# Patient Record
Sex: Female | Born: 1975 | Race: White | Hispanic: No | Smoking: Never smoker
Health system: Southern US, Community
[De-identification: ages and names within clinical notes are randomized; demographics above are authoritative.]

## PROBLEM LIST (undated history)

## (undated) DIAGNOSIS — F419 Anxiety disorder, unspecified: Secondary | ICD-10-CM

## (undated) DIAGNOSIS — C4491 Basal cell carcinoma of skin, unspecified: Secondary | ICD-10-CM

## (undated) DIAGNOSIS — I493 Ventricular premature depolarization: Secondary | ICD-10-CM

## (undated) DIAGNOSIS — N6019 Diffuse cystic mastopathy of unspecified breast: Secondary | ICD-10-CM

## (undated) DIAGNOSIS — K219 Gastro-esophageal reflux disease without esophagitis: Secondary | ICD-10-CM

## (undated) HISTORY — DX: Gastro-esophageal reflux disease without esophagitis: K21.9

## (undated) HISTORY — DX: Anxiety disorder, unspecified: F41.9

## (undated) HISTORY — DX: Basal cell carcinoma of skin, unspecified: C44.91

## (undated) HISTORY — DX: Ventricular premature depolarization: I49.3

## (undated) HISTORY — DX: Diffuse cystic mastopathy of unspecified breast: N60.19

---

## 1997-11-01 ENCOUNTER — Inpatient Hospital Stay (HOSPITAL_COMMUNITY): Admission: AD | Admit: 1997-11-01 | Discharge: 1997-11-04 | Payer: Self-pay | Admitting: Obstetrics and Gynecology

## 1999-05-31 ENCOUNTER — Other Ambulatory Visit: Admission: RE | Admit: 1999-05-31 | Discharge: 1999-05-31 | Payer: Self-pay | Admitting: Obstetrics and Gynecology

## 2000-09-12 ENCOUNTER — Other Ambulatory Visit: Admission: RE | Admit: 2000-09-12 | Discharge: 2000-09-12 | Payer: Self-pay | Admitting: Obstetrics and Gynecology

## 2000-11-11 ENCOUNTER — Emergency Department (HOSPITAL_COMMUNITY): Admission: EM | Admit: 2000-11-11 | Discharge: 2000-11-11 | Payer: Self-pay | Admitting: *Deleted

## 2001-12-17 ENCOUNTER — Other Ambulatory Visit: Admission: RE | Admit: 2001-12-17 | Discharge: 2001-12-17 | Payer: Self-pay | Admitting: Obstetrics and Gynecology

## 2002-12-31 ENCOUNTER — Other Ambulatory Visit: Admission: RE | Admit: 2002-12-31 | Discharge: 2002-12-31 | Payer: Self-pay | Admitting: Obstetrics and Gynecology

## 2003-02-16 ENCOUNTER — Encounter: Payer: Self-pay | Admitting: Emergency Medicine

## 2003-02-16 ENCOUNTER — Emergency Department (HOSPITAL_COMMUNITY): Admission: EM | Admit: 2003-02-16 | Discharge: 2003-02-16 | Payer: Self-pay | Admitting: Emergency Medicine

## 2004-01-29 ENCOUNTER — Other Ambulatory Visit: Admission: RE | Admit: 2004-01-29 | Discharge: 2004-01-29 | Payer: Self-pay | Admitting: Obstetrics and Gynecology

## 2004-08-10 ENCOUNTER — Other Ambulatory Visit: Admission: RE | Admit: 2004-08-10 | Discharge: 2004-08-10 | Payer: Self-pay | Admitting: Obstetrics and Gynecology

## 2004-12-07 ENCOUNTER — Observation Stay (HOSPITAL_COMMUNITY): Admission: RE | Admit: 2004-12-07 | Discharge: 2004-12-08 | Payer: Self-pay | Admitting: Obstetrics and Gynecology

## 2004-12-07 ENCOUNTER — Encounter (INDEPENDENT_AMBULATORY_CARE_PROVIDER_SITE_OTHER): Payer: Self-pay | Admitting: *Deleted

## 2005-08-29 HISTORY — PX: LAPAROSCOPIC HYSTERECTOMY: SHX1926

## 2006-10-24 ENCOUNTER — Ambulatory Visit: Payer: Self-pay | Admitting: Internal Medicine

## 2006-10-26 ENCOUNTER — Encounter: Admission: RE | Admit: 2006-10-26 | Discharge: 2006-10-26 | Payer: Self-pay | Admitting: Internal Medicine

## 2007-11-29 ENCOUNTER — Ambulatory Visit: Payer: Self-pay | Admitting: Internal Medicine

## 2007-11-29 DIAGNOSIS — F411 Generalized anxiety disorder: Secondary | ICD-10-CM | POA: Insufficient documentation

## 2007-11-29 LAB — CONVERTED CEMR LAB
Basophils Absolute: 0.1 10*3/uL (ref 0.0–0.1)
Basophils Relative: 0.8 % (ref 0.0–1.0)
Eosinophils Absolute: 0.3 10*3/uL (ref 0.0–0.7)
Eosinophils Relative: 3.7 % (ref 0.0–5.0)
Free T4: 1 ng/dL (ref 0.6–1.6)
HCT: 41.5 % (ref 36.0–46.0)
Hemoglobin: 13.7 g/dL (ref 12.0–15.0)
Hgb A1c MFr Bld: 5.1 % (ref 4.6–6.0)
Lymphocytes Relative: 21.3 % (ref 12.0–46.0)
MCHC: 33 g/dL (ref 30.0–36.0)
MCV: 90 fL (ref 78.0–100.0)
Monocytes Absolute: 0.3 10*3/uL (ref 0.1–1.0)
Monocytes Relative: 4.3 % (ref 3.0–12.0)
Neutro Abs: 5.6 10*3/uL (ref 1.4–7.7)
Neutrophils Relative %: 69.9 % (ref 43.0–77.0)
Platelets: 334 10*3/uL (ref 150–400)
RBC: 4.61 M/uL (ref 3.87–5.11)
RDW: 12.4 % (ref 11.5–14.6)
T3 Uptake Ratio: 39.7 % — ABNORMAL HIGH (ref 22.5–37.0)
TSH: 2.31 microintl units/mL (ref 0.35–5.50)
WBC: 8 10*3/uL (ref 4.5–10.5)

## 2007-12-03 ENCOUNTER — Telehealth: Payer: Self-pay | Admitting: *Deleted

## 2007-12-05 ENCOUNTER — Ambulatory Visit: Payer: Self-pay | Admitting: Internal Medicine

## 2007-12-05 LAB — CONVERTED CEMR LAB
ALT: 17 units/L (ref 0–35)
AST: 22 units/L (ref 0–37)
Albumin: 4.1 g/dL (ref 3.5–5.2)
Alkaline Phosphatase: 42 units/L (ref 39–117)
BUN: 10 mg/dL (ref 6–23)
Basophils Absolute: 0 10*3/uL (ref 0.0–0.1)
Basophils Relative: 0.2 % (ref 0.0–1.0)
Bilirubin Urine: NEGATIVE
Bilirubin, Direct: 0.1 mg/dL (ref 0.0–0.3)
Blood in Urine, dipstick: NEGATIVE
CO2: 30 meq/L (ref 19–32)
Calcium: 9.7 mg/dL (ref 8.4–10.5)
Chloride: 108 meq/L (ref 96–112)
Cholesterol: 180 mg/dL (ref 0–200)
Creatinine, Ser: 0.8 mg/dL (ref 0.4–1.2)
Eosinophils Absolute: 0.2 10*3/uL (ref 0.0–0.7)
Eosinophils Relative: 3.2 % (ref 0.0–5.0)
GFR calc Af Amer: 106 mL/min
GFR calc non Af Amer: 88 mL/min
Glucose, Bld: 87 mg/dL (ref 70–99)
Glucose, Urine, Semiquant: NEGATIVE
HCT: 40 % (ref 36.0–46.0)
HDL: 54.5 mg/dL (ref >39.0)
Hemoglobin: 13.5 g/dL (ref 12.0–15.0)
Ketones, urine, test strip: NEGATIVE
LDL Cholesterol: 117 mg/dL — ABNORMAL HIGH (ref 0–99)
Lymphocytes Relative: 29.7 % (ref 12.0–46.0)
MCHC: 33.6 g/dL (ref 30.0–36.0)
MCV: 89.3 fL (ref 78.0–100.0)
Monocytes Absolute: 0.5 10*3/uL (ref 0.1–1.0)
Monocytes Relative: 7.7 % (ref 3.0–12.0)
Neutro Abs: 3.6 10*3/uL (ref 1.4–7.7)
Neutrophils Relative %: 59.2 % (ref 43.0–77.0)
Nitrite: NEGATIVE
Platelets: 316 10*3/uL (ref 150–400)
Potassium: 4.6 meq/L (ref 3.5–5.1)
Protein, U semiquant: NEGATIVE
RBC: 4.48 M/uL (ref 3.87–5.11)
RDW: 12.2 % (ref 11.5–14.6)
Sodium: 142 meq/L (ref 135–145)
Specific Gravity, Urine: 1.01
TSH: 1.34 microintl units/mL (ref 0.35–5.50)
Total Bilirubin: 0.8 mg/dL (ref 0.3–1.2)
Total CHOL/HDL Ratio: 3.3
Total Protein: 7.4 g/dL (ref 6.0–8.3)
Triglycerides: 43 mg/dL (ref 0–149)
Urobilinogen, UA: 0.2
VLDL: 9 mg/dL (ref 0–40)
WBC Urine, dipstick: NEGATIVE
WBC: 6.1 10*3/uL (ref 4.5–10.5)
pH: 6

## 2007-12-12 ENCOUNTER — Ambulatory Visit: Payer: Self-pay | Admitting: Internal Medicine

## 2007-12-12 ENCOUNTER — Other Ambulatory Visit: Admission: RE | Admit: 2007-12-12 | Discharge: 2007-12-12 | Payer: Self-pay | Admitting: Neurosurgery

## 2007-12-12 ENCOUNTER — Encounter: Payer: Self-pay | Admitting: Internal Medicine

## 2007-12-18 ENCOUNTER — Telehealth: Payer: Self-pay | Admitting: Internal Medicine

## 2007-12-21 ENCOUNTER — Encounter: Admission: RE | Admit: 2007-12-21 | Discharge: 2007-12-21 | Payer: Self-pay | Admitting: Internal Medicine

## 2008-04-07 ENCOUNTER — Ambulatory Visit: Payer: Self-pay | Admitting: Internal Medicine

## 2009-01-07 ENCOUNTER — Ambulatory Visit: Payer: Self-pay | Admitting: Internal Medicine

## 2009-01-07 LAB — CONVERTED CEMR LAB
ALT: 14 units/L (ref 0–35)
AST: 18 units/L (ref 0–37)
Albumin: 3.6 g/dL (ref 3.5–5.2)
Alkaline Phosphatase: 43 units/L (ref 39–117)
BUN: 13 mg/dL (ref 6–23)
Basophils Absolute: 0 10*3/uL (ref 0.0–0.1)
Basophils Relative: 0.1 % (ref 0.0–3.0)
Bilirubin Urine: NEGATIVE
Bilirubin, Direct: 0 mg/dL (ref 0.0–0.3)
Blood in Urine, dipstick: NEGATIVE
CO2: 30 meq/L (ref 19–32)
Calcium: 9.1 mg/dL (ref 8.4–10.5)
Chloride: 110 meq/L (ref 96–112)
Cholesterol: 188 mg/dL (ref 0–200)
Creatinine, Ser: 0.9 mg/dL (ref 0.4–1.2)
Eosinophils Absolute: 0.3 10*3/uL (ref 0.0–0.7)
Eosinophils Relative: 5.3 % — ABNORMAL HIGH (ref 0.0–5.0)
GFR calc non Af Amer: 75.73 mL/min (ref >60)
Glucose, Bld: 82 mg/dL (ref 70–99)
Glucose, Urine, Semiquant: NEGATIVE
HCT: 38 % (ref 36.0–46.0)
HDL: 55.6 mg/dL (ref >39.00)
Hemoglobin: 13.3 g/dL (ref 12.0–15.0)
Ketones, urine, test strip: NEGATIVE
LDL Cholesterol: 124 mg/dL — ABNORMAL HIGH (ref 0–99)
Lymphocytes Relative: 27.9 % (ref 12.0–46.0)
Lymphs Abs: 1.5 10*3/uL (ref 0.7–4.0)
MCHC: 35.1 g/dL (ref 30.0–36.0)
MCV: 89.2 fL (ref 78.0–100.0)
Monocytes Absolute: 0.4 10*3/uL (ref 0.1–1.0)
Monocytes Relative: 7.5 % (ref 3.0–12.0)
Neutro Abs: 3.2 10*3/uL (ref 1.4–7.7)
Neutrophils Relative %: 59.2 % (ref 43.0–77.0)
Nitrite: NEGATIVE
Platelets: 286 10*3/uL (ref 150.0–400.0)
Potassium: 4.3 meq/L (ref 3.5–5.1)
RBC: 4.26 M/uL (ref 3.87–5.11)
RDW: 11.7 % (ref 11.5–14.6)
Sodium: 142 meq/L (ref 135–145)
Specific Gravity, Urine: 1.015
TSH: 1.43 microintl units/mL (ref 0.35–5.50)
Total Bilirubin: 0.8 mg/dL (ref 0.3–1.2)
Total CHOL/HDL Ratio: 3
Total Protein: 7 g/dL (ref 6.0–8.3)
Triglycerides: 44 mg/dL (ref 0.0–149.0)
Urobilinogen, UA: 0.2
VLDL: 8.8 mg/dL (ref 0.0–40.0)
WBC Urine, dipstick: NEGATIVE
WBC: 5.4 10*3/uL (ref 4.5–10.5)
pH: 8.5

## 2009-01-15 ENCOUNTER — Other Ambulatory Visit: Admission: RE | Admit: 2009-01-15 | Discharge: 2009-01-15 | Payer: Self-pay | Admitting: Internal Medicine

## 2009-01-15 ENCOUNTER — Ambulatory Visit: Payer: Self-pay | Admitting: Internal Medicine

## 2009-01-15 ENCOUNTER — Encounter: Payer: Self-pay | Admitting: Internal Medicine

## 2009-01-15 LAB — HM PAP SMEAR

## 2009-06-18 ENCOUNTER — Ambulatory Visit: Payer: Self-pay | Admitting: Internal Medicine

## 2009-08-07 ENCOUNTER — Ambulatory Visit: Payer: Self-pay | Admitting: Internal Medicine

## 2010-03-22 ENCOUNTER — Ambulatory Visit: Payer: Self-pay | Admitting: Internal Medicine

## 2010-03-22 LAB — CONVERTED CEMR LAB
ALT: 19 units/L (ref 0–35)
AST: 22 units/L (ref 0–37)
Albumin: 4 g/dL (ref 3.5–5.2)
Alkaline Phosphatase: 35 units/L — ABNORMAL LOW (ref 39–117)
BUN: 16 mg/dL (ref 6–23)
Basophils Absolute: 0.1 10*3/uL (ref 0.0–0.1)
Basophils Relative: 0.9 % (ref 0.0–3.0)
Bilirubin Urine: NEGATIVE
Bilirubin, Direct: 0.2 mg/dL (ref 0.0–0.3)
Blood in Urine, dipstick: NEGATIVE
CO2: 29 meq/L (ref 19–32)
Calcium: 9.2 mg/dL (ref 8.4–10.5)
Chloride: 103 meq/L (ref 96–112)
Cholesterol: 191 mg/dL (ref 0–200)
Creatinine, Ser: 1 mg/dL (ref 0.4–1.2)
Eosinophils Absolute: 0.2 10*3/uL (ref 0.0–0.7)
Eosinophils Relative: 4 % (ref 0.0–5.0)
GFR calc non Af Amer: 70.67 mL/min (ref >60)
Glucose, Bld: 83 mg/dL (ref 70–99)
Glucose, Urine, Semiquant: NEGATIVE
HCT: 37.8 % (ref 36.0–46.0)
HDL: 67.4 mg/dL (ref >39.00)
Hemoglobin: 13.1 g/dL (ref 12.0–15.0)
Ketones, urine, test strip: NEGATIVE
LDL Cholesterol: 111 mg/dL — ABNORMAL HIGH (ref 0–99)
Lymphocytes Relative: 22.8 % (ref 12.0–46.0)
Lymphs Abs: 1.3 10*3/uL (ref 0.7–4.0)
MCHC: 34.8 g/dL (ref 30.0–36.0)
MCV: 91.8 fL (ref 78.0–100.0)
Monocytes Absolute: 0.4 10*3/uL (ref 0.1–1.0)
Monocytes Relative: 7.6 % (ref 3.0–12.0)
Neutro Abs: 3.7 10*3/uL (ref 1.4–7.7)
Neutrophils Relative %: 64.7 % (ref 43.0–77.0)
Nitrite: NEGATIVE
Platelets: 297 10*3/uL (ref 150.0–400.0)
Potassium: 3.8 meq/L (ref 3.5–5.1)
RBC: 4.12 M/uL (ref 3.87–5.11)
RDW: 12.8 % (ref 11.5–14.6)
Sodium: 138 meq/L (ref 135–145)
Specific Gravity, Urine: 1.025
TSH: 1.73 microintl units/mL (ref 0.35–5.50)
Total Bilirubin: 0.7 mg/dL (ref 0.3–1.2)
Total CHOL/HDL Ratio: 3
Total Protein: 7.2 g/dL (ref 6.0–8.3)
Triglycerides: 65 mg/dL (ref 0.0–149.0)
Urobilinogen, UA: 0.2
VLDL: 13 mg/dL (ref 0.0–40.0)
WBC Urine, dipstick: NEGATIVE
WBC: 5.8 10*3/uL (ref 4.5–10.5)
pH: 5

## 2010-04-12 ENCOUNTER — Other Ambulatory Visit: Admission: RE | Admit: 2010-04-12 | Discharge: 2010-04-12 | Payer: Self-pay | Admitting: Internal Medicine

## 2010-04-13 ENCOUNTER — Ambulatory Visit: Payer: Self-pay | Admitting: Internal Medicine

## 2010-04-13 LAB — CONVERTED CEMR LAB: Pap Smear: NORMAL

## 2010-04-15 LAB — CONVERTED CEMR LAB
HCV Ab: NEGATIVE
Hep A IgM: NEGATIVE
Hep B C IgM: NEGATIVE
Hepatitis B Surface Ag: NEGATIVE

## 2010-04-16 ENCOUNTER — Encounter: Admission: RE | Admit: 2010-04-16 | Discharge: 2010-04-16 | Payer: Self-pay | Admitting: Internal Medicine

## 2010-04-16 LAB — CONVERTED CEMR LAB: Pap Smear: NEGATIVE

## 2010-04-16 LAB — HM MAMMOGRAPHY

## 2010-09-30 NOTE — Progress Notes (Signed)
  Phone Note Call from Patient   Caller: Patient Reason for Call: Talk to Doctor Summary of Call: called pt this am and gave her results of pap- pt states she is concerned because she has been on phentermine 37.5 for 5 days and after exercising yesterday her heart was racing and took about 10 mintues before heart calmed down.  Is this normal and does she need to decrease dose?  Initial call taken by: Willy Eddy, LPN,  December 18, 2007 7:59 AM  Follow-up for Phone Call        per dr Kalman Shan phentermine--pt informed Follow-up by: Willy Eddy, LPN,  December 18, 2007 8:37 AM

## 2010-09-30 NOTE — Assessment & Plan Note (Signed)
Summary: 3 month rov/njr   Vital Signs:  Patient Profile:   35 Years Old Female Height:     63 inches Weight:      152 pounds Temp:     98.2 degrees F oral Pulse rate:   76 / minute Resp:     14 per minute BP sitting:   110 / 70  (left arm)  Vitals Entered By: Willy Eddy, LPN (April 07, 2008 11:13 AM)                 Chief Complaint:  roa- stopped phenteramine after 1 week due to heart racing- c/o dysuria.    Current Allergies: No known allergies   Past Medical History:    Reviewed history from 11/29/2007 and no changes required:       Anxiety       Asthma  Past Surgical History:    Reviewed history from 11/29/2007 and no changes required:       Hysterectomy   Family History:    Reviewed history and no changes required:       mother : well       father lung ca DIED  at 75  Social History:    Reviewed history from 11/29/2007 and no changes required:       Married       Never Smoked       Alcohol use-yes       Drug use-yes   Risk Factors:  Passive smoke exposure:  yes  Family History Risk Factors:    Family History of MI in females < 61 years old:  no    Family History of MI in males < 21 years old:  no   Review of Systems  The patient denies anorexia, fever, weight loss, weight gain, vision loss, decreased hearing, hoarseness, chest pain, syncope, dyspnea on exertion, peripheral edema, prolonged cough, headaches, hemoptysis, abdominal pain, melena, hematochezia, severe indigestion/heartburn, hematuria, incontinence, genital sores, muscle weakness, suspicious skin lesions, transient blindness, difficulty walking, depression, unusual weight change, abnormal bleeding, enlarged lymph nodes, angioedema, and breast masses.     Physical Exam  General:     Well-developed,well-nourished,in no acute distress; alert,appropriate and cooperative throughout examination Eyes:     No corneal or conjunctival inflammation noted. EOMI. Perrla. Funduscopic  exam benign, without hemorrhages, exudates or papilledema. Vision grossly normal. Ears:     External ear exam shows no significant lesions or deformities.  Otoscopic examination reveals clear canals, tympanic membranes are intact bilaterally without bulging, retraction, inflammation or discharge. Hearing is grossly normal bilaterally. Nose:     External nasal examination shows no deformity or inflammation. Nasal mucosa are pink and moist without lesions or exudates. Neck:     No deformities, masses, or tenderness noted. Lungs:     Normal respiratory effort, chest expands symmetrically. Lungs are clear to auscultation, no crackles or wheezes. Heart:     Normal rate and regular rhythm. S1 and S2 normal without gallop, murmur, click, rub or other extra sounds. Abdomen:     Bowel sounds positive,abdomen soft and non-tender without masses, organomegaly or hernias noted. Rectal:     No external abnormalities noted. Normal sphincter tone. No rectal masses or tenderness. Genitalia:      surgically abscent cervixnormal introitus, no vaginal discharge, mucosa pink and moist, and no adnexal masses or tenderness.      Impression & Recommendations:  Problem # 1:  EXTRINSIC ASTHMA, UNSPECIFIED (ICD-493.00) rare use of proiventil Her updated medication list  for this problem includes:    Proventil Hfa 108 (90 Base) Mcg/act Aers (Albuterol sulfate) .Marland Kitchen... Tewo puff by mouth as needed   Problem # 2:  CANDIDIASIS, VAGINAL (ICD-112.1)  Her updated medication list for this problem includes:    Fluconazole 150 Mg Tabs (Fluconazole) ..... One by mouth now and repeat in 5 days Discussed treatment regimen and preventive measures.   Problem # 3:  ABNORMAL WEIGHT GAIN (ICD-783.1) weight loss  Complete Medication List: 1)  Proventil Hfa 108 (90 Base) Mcg/act Aers (Albuterol sulfate) .... Tewo puff by mouth as needed 2)  Zyrtec 10 Mg Chew (Cetirizine hcl) .... Once daily as needed 3)  Fluconazole 150 Mg  Tabs (Fluconazole) .... One by mouth now and repeat in 5 days   Patient Instructions: 1)  ROV AND PAP in april 2010   Prescriptions: PROVENTIL HFA 108 (90 BASE) MCG/ACT  AERS (ALBUTEROL SULFATE) tewo puff by mouth as needed  #1 x 11   Entered and Authorized by:   Stacie Glaze MD   Signed by:   Stacie Glaze MD on 04/07/2008   Method used:   Electronically sent to ...       [facility]*       [address] [place], [place]  [address]       Ph: [phone]       Fax: [phone]   RxID:   [id number] FLUCONAZOLE 150 MG  TABS (FLUCONAZOLE) one by mouth now and repeat in 5 days  #2 x 0   Entered and Authorized by:   Stacie Glaze MD   Signed by:   Stacie Glaze MD on 04/07/2008   Method used:   Electronically sent to ...       [facility]*       [address] [place], [place]  [address]       Ph: [phone]       Fax: [phone]   RxID:   [id number]  ]  Appended Document: 3 month rov/njr  Laboratory Results   Urine Tests   Date/Time Reported: April 07, 2008 12:08 PM   Routine Urinalysis   Color: yellow Appearance: Clear Glucose: negative   (Normal Range: Negative) Bilirubin: negative   (Normal Range: Negative) Ketone: negative   (Normal Range: Negative) Spec. Gravity: 1.020   (Normal Range: 1.003-1.035) Blood: negative   (Normal Range: Negative) pH: 7.0   (Normal Range: 5.0-8.0) Protein: negative   (Normal Range: Negative) Urobilinogen: 0.2   (Normal Range: 0-1) Nitrite: negative   (Normal Range: Negative) Leukocyte Esterace: negative   (Normal Range: Negative)    Comments: Rita Ohara  April 07, 2008 12:08 PM

## 2010-09-30 NOTE — Assessment & Plan Note (Signed)
Summary: consult re: ear blockage/sinuses/cjr   Vital Signs:  Patient profile:   35 year old female Height:      63 inches Weight:      152 pounds BMI:     27.02 Temp:     98.2 degrees F oral Pulse rate:   76 / minute Resp:     14 per minute BP sitting:   136 / 80  (left arm)  Vitals Entered By: Willy Eddy, LPN (August 07, 2009 2:52 PM) CC: c/o left earache   CC:  c/o left earache.  History of Present Illness: ear pain with popping sensation in left ear no current sinus symptoms since she has been of the allergra the chronic ifectins by hx have improved she still has seasonal allergies she has mild unilateral head aches due to the sinus pressure  Problems Prior to Update: 1)  Chronic Frontal Sinusitis  (ICD-473.1) 2)  Asthma  (ICD-493.90) 3)  Candidiasis, Vaginal  (ICD-112.1) 4)  Breast Mass, Left  (ICD-611.72) 5)  Extrinsic Asthma, Unspecified  (ICD-493.00) 6)  Routine General Medical Exam@[facility]  (ICD-V70.0) 7)  Abnormal Weight Gain  (ICD-783.1) 8)  Anxiety  (ICD-300.00)  Medications Prior to Update: 1)  Proventil Hfa 108 (90 Base) Mcg/act  Aers (Albuterol Sulfate) .... Tewo Puff By Mouth As Needed 2)  Zyrtec 10 Mg  Chew (Cetirizine Hcl) .... Once Daily As Needed 3)  Fluconazole 150 Mg Tabs (Fluconazole) .... One By Mouth Now and Repeat in 5 Days 4)  Clarithromycin 500 Mg Xr24h-Tab (Clarithromycin) .... One By Mouth Two Times A Day 5)  Fexofenadine-Pseudoephedrine 60-120 Mg Xr12h-Tab (Fexofenadine-Pseudoephedrine) .... One By Mouth Two Times A Day  Current Medications (verified): 1)  Proventil Hfa 108 (90 Base) Mcg/act  Aers (Albuterol Sulfate) .... Tewo Puff By Mouth As Needed 2)  Fexofenadine-Pseudoephedrine 60-120 Mg Xr12h-Tab (Fexofenadine-Pseudoephedrine) .... One By Mouth Two Times A Day 3)  Astelin 137 Mcg/spray Soln (Azelastine Hcl) .... One Spray in Each Nostril, Two Times A Day 4)  Veramyst 27.5 Mcg/spray Susp (Fluticasone Furoate) ....  Two Spray in Each Nostril Two Times A Day  Allergies (verified): No Known Drug Allergies  Past History:  Family History: Last updated: 04/07/2008 mother : well father lung ca DIED  at 2  Social History: Last updated: 11/29/2007 Married Never Smoked Alcohol use-yes Drug use-yes  Risk Factors: Smoking Status: never (11/29/2007) Passive Smoke Exposure: yes (04/07/2008)  Past medical, surgical, family and social histories (including risk factors) reviewed, and no changes noted (except as noted below).  Past Medical History: Reviewed history from 04/07/2008 and no changes required. Anxiety Asthma  Past Surgical History: Reviewed history from 11/29/2007 and no changes required. Hysterectomy  Family History: Reviewed history from 04/07/2008 and no changes required. mother : well father lung ca DIED  at 68  Social History: Reviewed history from 11/29/2007 and no changes required. Married Never Smoked Alcohol use-yes Drug use-yes  Review of Systems  The patient denies anorexia, fever, weight loss, weight gain, vision loss, decreased hearing, hoarseness, chest pain, syncope, dyspnea on exertion, peripheral edema, prolonged cough, headaches, hemoptysis, abdominal pain, melena, hematochezia, severe indigestion/heartburn, hematuria, incontinence, genital sores, muscle weakness, suspicious skin lesions, transient blindness, difficulty walking, depression, unusual weight change, abnormal bleeding, enlarged lymph nodes, angioedema, and breast masses.    Physical Exam  General:  alert and well-developed.   Head:  normocephalic and atraumatic.   Eyes:  pupils equal and pupils round.   Ears:  TM are abnormalR TM retraction,  L TM retraction, and L decreased hearing.   Nose:  mucosal edema and airflow obstruction.   Mouth:  tonsil hypertropied, posterior lymphoid hypertrophy, postnasal drip, and pharyngeal crowding.   Neck:  No deformities, masses, or tenderness noted. Lungs:   normal respiratory effort and no intercostal retractions.   Heart:  normal rate and regular rhythm.   Abdomen:  Bowel sounds positive,abdomen soft and non-tender without masses, organomegaly or hernias noted.   Impression & Recommendations:  Problem # 1:  EUSTACHIAN TUBE DYSFUNCTION (ICD-381.81) veramyst and astelin trial two times a day for the persistant bloackge of the eusttion tube with referral to ent if the symptoms do no abate avoid steriods at this time  Problem # 2:  CHRONIC FRONTAL SINUSITIS (ICD-473.1)  improved with the fexofenadine d The following medications were removed from the medication list:    Clarithromycin 500 Mg Xr24h-tab (Clarithromycin) ..... One by mouth two times a day Her updated medication list for this problem includes:    Fexofenadine-pseudoephedrine 60-120 Mg Xr12h-tab (Fexofenadine-pseudoephedrine) ..... One by mouth two times a day    Astelin 137 Mcg/spray Soln (Azelastine hcl) ..... One spray in each nostril, two times a day    Veramyst 27.5 Mcg/spray Susp (Fluticasone furoate) .Marland Kitchen..Marland Kitchen Two spray in each nostril two times a day  Take antibiotics for full duration. Discussed treatment options including indications for coronal CT scan of sinuses and ENT referral.   Patient Instructions: 1)  Please schedule a follow-up appointment in 2 months.  Appended Document: Orders Update    Clinical Lists Changes  Orders: Added new Service order of Est. Patient Level IV (42595) - Signed

## 2010-09-30 NOTE — Assessment & Plan Note (Signed)
Summary: cpx pap mhf   Vital Signs:  Patient profile:   35 year old female Height:      63 inches Weight:      154 pounds BMI:     27.38 Temp:     98.2 degrees F oral Pulse rate:   76 / minute Resp:     14 per minute BP sitting:   140 / 80  (left arm)  Vitals Entered By: Willy Eddy, LPN (Jan 15, 2009 10:55 AM)  Nutrition Counseling: Patient's BMI is greater than 25 and therefore counseled on weight management options.  CC:  cpx and pap.  History of Present Illness: CPX reviewed health prototocols reveiwed labs has a slight soreness preauricular  Problems Prior to Update: 1)  Asthma  (ICD-493.90) 2)  Candidiasis, Vaginal  (ICD-112.1) 3)  Breast Mass, Left  (ICD-611.72) 4)  Extrinsic Asthma, Unspecified  (ICD-493.00) 5)  Routine General Medical Exam@[facility]  (ICD-V70.0) 6)  Abnormal Weight Gain  (ICD-783.1) 7)  Anxiety  (ICD-300.00)  Medications Prior to Update: 1)  Proventil Hfa 108 (90 Base) Mcg/act  Aers (Albuterol Sulfate) .... Tewo Puff By Mouth As Needed 2)  Zyrtec 10 Mg  Chew (Cetirizine Hcl) .... Once Daily As Needed 3)  Fluconazole 150 Mg  Tabs (Fluconazole) .... One By Mouth Now and Repeat in 5 Days  Current Medications (verified): 1)  Proventil Hfa 108 (90 Base) Mcg/act  Aers (Albuterol Sulfate) .... Tewo Puff By Mouth As Needed 2)  Zyrtec 10 Mg  Chew (Cetirizine Hcl) .... Once Daily As Needed 3)  Fluconazole 150 Mg  Tabs (Fluconazole) .... One By Mouth Now and Repeat in 5 Days  Allergies (verified): No Known Drug Allergies  Past History:  Family History:    mother : well    father lung ca DIED  at 8 (30-Apr-2008)  Social History:    Married    Never Smoked    Alcohol use-yes    Drug use-yes     (11/29/2007)  Risk Factors:    Alcohol Use: N/A    >5 drinks/d w/in last 3 months: N/A    Caffeine Use: N/A    Diet: N/A    Exercise: N/A  Risk Factors:    Smoking Status: never (11/29/2007)    Packs/Day: N/A    Cigars/wk: N/A   Pipe Use/wk: N/A    Cans of tobacco/wk: N/A    Passive Smoke Exposure: yes (April 30, 2008)  Past medical, surgical, family and social histories (including risk factors) reviewed, and no changes noted (except as noted below).  Past Medical History:    Reviewed history from 04-30-08 and no changes required:    Anxiety    Asthma  Past Surgical History:    Reviewed history from 11/29/2007 and no changes required:    Hysterectomy  Family History:    Reviewed history from April 30, 2008 and no changes required:       mother : well       father lung ca DIED  at 15  Social History:    Reviewed history from 11/29/2007 and no changes required:       Married       Never Smoked       Alcohol use-yes       Drug use-yes  Review of Systems  The patient denies anorexia, fever, weight loss, weight gain, vision loss, decreased hearing, hoarseness, chest pain, syncope, dyspnea on exertion, peripheral edema, prolonged cough, headaches, hemoptysis, abdominal pain, melena, hematochezia, severe indigestion/heartburn, hematuria,  incontinence, genital sores, muscle weakness, suspicious skin lesions, transient blindness, difficulty walking, depression, unusual weight change, abnormal bleeding, enlarged lymph nodes, angioedema, and breast masses.    Physical Exam  General:  Well-developed,well-nourished,in no acute distress; alert,appropriate and cooperative throughout examination Head:  Normocephalic and atraumatic without obvious abnormalities. No apparent alopecia or balding. Eyes:  No corneal or conjunctival inflammation noted. EOMI. Perrla. Funduscopic exam benign, without hemorrhages, exudates or papilledema. Vision grossly normal. Ears:  External ear exam shows no significant lesions or deformities.  Otoscopic examination reveals clear canals, tympanic membranes are intact bilaterally without bulging, retraction, inflammation or discharge. Hearing is grossly normal bilaterally. Nose:  External nasal  examination shows no deformity or inflammation. Nasal mucosa are pink and moist without lesions or exudates. Neck:  No deformities, masses, or tenderness noted. Lungs:  Normal respiratory effort, chest expands symmetrically. Lungs are clear to auscultation, no crackles or wheezes. Heart:  Normal rate and regular rhythm. S1 and S2 normal without gallop, murmur, click, rub or other extra sounds.   Impression & Recommendations:  Problem # 1:  ROUTINE GENERAL MEDICAL EXAM@[facility]ALTH  [facility]ARE F[facility]L (ICD-V70.0) reveiwed protocols labs ans set health goals Td Booster: Historical (01/26/2001)   Chol: 188 (01/07/2009)   HDL: 55.60 (01/07/2009)   LDL: 124 (01/07/2009)   TG: 44.0 (01/07/2009) TSH: 1.43 (01/07/2009)   HgbA1C: 5.1 (11/29/2007)    Discussed using sunscreen, use of alcohol, drug use, self breast exam, routine dental care, routine eye care, schedule for GYN exam, routine physical exam, seat belts, multiple vitamins, osteoporosis prevention, adequate calcium intake in diet, recommendations for immunizations, mammograms and Pap smears.  Discussed exercise and checking cholesterol.  Discussed gun safety, safe sex, and contraception.  Complete Medication List: 1)  Proventil Hfa 108 (90 Base) Mcg/act Aers (Albuterol sulfate) .... Tewo puff by mouth as needed 2)  Zyrtec 10 Mg Chew (Cetirizine hcl) .... Once daily as needed 3)  Fluconazole 150 Mg Tabs (Fluconazole) .... One by mouth now and repeat in 5 days  Patient Instructions: 1)  fall for allergy review   OCT  rov   Preventive Care Screening  Pap Smear:    Next Due:  01/2010  Last Tetanus Booster:    Date:  01/26/2001    Results:  Historical

## 2010-09-30 NOTE — Assessment & Plan Note (Signed)
Summary: cpx/pap/jls   Vital Signs:  Patient Profile:   35 Years Old Female Height:     63 inches Weight:      152 pounds Temp:     98.2 degrees F oral Pulse rate:   77 / minute Resp:     14 per minute BP sitting:   130 / 80  (left arm)  Vitals Entered By: Willy Eddy, LPN (December 12, 2007 10:55 AM)                 Chief Complaint:  cpx and pap and breast exam--has had partial hysterectomy with both ovaries remaining-dt in 2002.  History of Present Illness: weight is the same despite running and weight controll efforts diet changes     Prior Medication List:  No prior medications documented  Current Allergies (reviewed today): No known allergies   Past Medical History:    Reviewed history from 11/29/2007 and no changes required:       Anxiety  Past Surgical History:    Reviewed history from 11/29/2007 and no changes required:       Hysterectomy   Family History:    Reviewed history and no changes required:  Social History:    Reviewed history from 11/29/2007 and no changes required:       Married       Never Smoked       Alcohol use-yes       Drug use-yes    Review of Systems  The patient denies anorexia, fever, weight loss, weight gain, vision loss, decreased hearing, hoarseness, chest pain, syncope, dyspnea on exhertion, peripheral edema, prolonged cough, hemoptysis, abdominal pain, melena, hematochezia, severe indigestion/heartburn, hematuria, incontinence, genital sores, muscle weakness, suspicious skin lesions, transient blindness, difficulty walking, depression, unusual weight change, abnormal bleeding, enlarged lymph nodes, angioedema, and breast masses.     Physical Exam  General:     Well-developed,well-nourished,in no acute distress; alert,appropriate and cooperative throughout examination Head:     Normocephalic and atraumatic without obvious abnormalities. No apparent alopecia or balding. Eyes:     No corneal or conjunctival  inflammation noted. EOMI. Perrla. Funduscopic exam benign, without hemorrhages, exudates or papilledema. Vision grossly normal. Nose:     External nasal examination shows no deformity or inflammation. Nasal mucosa are pink and moist without lesions or exudates. Neck:     No deformities, masses, or tenderness noted. Chest Wall:     No deformities, masses, or tenderness noted. Breasts:     No mass, nodules, thickening, tenderness, bulging, retraction, inflamation, nipple discharge or skin changes noted.   Lungs:     Normal respiratory effort, chest expands symmetrically. Lungs are clear to auscultation, no crackles or wheezes. Heart:     Normal rate and regular rhythm. S1 and S2 normal without gallop, murmur, click, rub or other extra sounds. Abdomen:     Bowel sounds positive,abdomen soft and non-tender without masses, organomegaly or hernias noted. Rectal:     No external abnormalities noted. Normal sphincter tone. No rectal masses or tenderness. Genitalia:      surgically abscent cervixnormal introitus, no vaginal discharge, mucosa pink and moist, and no adnexal masses or tenderness.      Impression & Recommendations:  Problem # 1:  EXTRINSIC ASTHMA, UNSPECIFIED (ICD-493.00)  Her updated medication list for this problem includes:    Proventil Hfa 108 (90 Base) Mcg/act Aers (Albuterol sulfate) .Marland Kitchen... As needed\par   Problem # 2:  ROUTINE GENERAL MEDICAL EXAM@[facility] (ICD-V70.0) with pap  and review of labs Orders: EKG w/ Interpretation (93000)   Problem # 3:  ANXIETY (ICD-300.00) Discussed medication use and relaxation techniques.   Problem # 4:  ABNORMAL WEIGHT GAIN (ICD-783.1)  Complete Medication List: 1)  Proventil Hfa 108 (90 Base) Mcg/act Aers (Albuterol sulfate) .... As needed\par 2)  Zyrtec 10 Mg Chew (Cetirizine hcl) .... Once daily as needed 3)  Phentermine Hcl 37.5 Mg Caps (Phentermine hcl) .... One by mouth daily   Patient Instructions: 1)  Please  schedule a follow-up appointment in 3 months. 2)  You need to lose weight. Consider a lower calorie diet and regular exercise.  smalee frequent meaqls ( 5-6 a day) calorie total 1400-1500.    Prescriptions: PHENTERMINE HCL 37.5 MG  CAPS (PHENTERMINE HCL) one by mouth daily  #30 x 2   Entered and Authorized by:   Stacie Glaze MD   Signed by:   Stacie Glaze MD on 12/12/2007   Method used:   Print then Give to Patient   RxID:   [id number]  ]  Appended Document: Orders Update    Clinical Lists Changes  Problems: Added new problem of BREAST MASS, LEFT (ICD-611.72) - diagnostic mamogram Orders: Added new Referral order of Radiology Referral (Radiology) - Signed

## 2010-09-30 NOTE — Assessment & Plan Note (Signed)
Summary: ALWAYS TIRED/CCM   Vital Signs:  Patient Profile:   35 Years Old Female Weight:      150 pounds Temp:     98.2 degrees F oral Pulse rate:   76 / minute BP sitting:   120 / 80  (left arm)  Vitals Entered By: Willy Eddy, LPN (November 28, 1608 4:37 PM)                 Chief Complaint:  c/o gaining weight -10 pounds in 6 months but sh ewatches what she eats and runs 5 miles everyday--also c/o fatigue.  History of Present Illness: Hx of asthma and allergies Family hx of hypothroidism, mother and two sisters only medicine is zyrtec TAH    Past Medical History:    Reviewed history and no changes required:       Anxiety  Past Surgical History:    Reviewed history and no changes required:       Hysterectomy   Social History:    Reviewed history and no changes required:       Married       Never Smoked       Alcohol use-yes       Drug use-yes   Risk Factors:  Tobacco use:  never Drug use:  yes Alcohol use:  yes   Review of Systems       The patient complains of weight gain.  The patient denies anorexia, fever, weight loss, vision loss, decreased hearing, hoarseness, chest pain, syncope, dyspnea on exhertion, peripheral edema, prolonged cough, hemoptysis, abdominal pain, melena, hematochezia, severe indigestion/heartburn, hematuria, incontinence, genital sores, muscle weakness, suspicious skin lesions, transient blindness, difficulty walking, depression, unusual weight change, abnormal bleeding, enlarged lymph nodes, angioedema, and breast masses.     Physical Exam  General:     Well-developed,well-nourished,in no acute distress; alert,appropriate and cooperative throughout examination Head:     Normocephalic and atraumatic without obvious abnormalities. No apparent alopecia or balding. Eyes:     No corneal or conjunctival inflammation noted. EOMI. Perrla. Funduscopic exam benign, without hemorrhages, exudates or papilledema. Vision grossly  normal. Nose:     External nasal examination shows no deformity or inflammation. Nasal mucosa are pink and moist without lesions or exudates. Neck:     No deformities, masses, or tenderness noted. Lungs:     Normal respiratory effort, chest expands symmetrically. Lungs are clear to auscultation, no crackles or wheezes. Heart:     Normal rate and regular rhythm. S1 and S2 normal without gallop, murmur, click, rub or other extra sounds. Abdomen:     Bowel sounds positive,abdomen soft and non-tender without masses, organomegaly or hernias noted.    Impression & Recommendations:  Problem # 1:  ABNORMAL WEIGHT GAIN (ICD-783.1) pt states that no diet works will rule out metabolic Orders: TLB-TSH (Thyroid Stimulating Hormone) (84443-TSH) TLB-CBC Platelet - w/Differential (85025-CBCD) TLB-T3 Uptake (84479-T3UP) TLB-T4 (Thyrox), Free (84439-FT4R) TLB-A1C / Hgb A1C (Glycohemoglobin) (83036-A1C) if all is negaive   give rx for phenteramie   Patient Instructions: 1)  Please schedule a follow-up appointment in 2 weeks.  for PAP    ]

## 2010-09-30 NOTE — Assessment & Plan Note (Signed)
Summary: CPX // RS rsc bmp/njr   Vital Signs:  Patient profile:   35 year old female Height:      63 inches Weight:      146 pounds BMI:     25.96 Temp:     98.2 degrees F oral Pulse rate:   76 / minute Resp:     14 per minute BP sitting:   136 / 80  (left arm)  Vitals Entered By: Willy Eddy, LPN (April 13, 2010 3:21 PM)  Nutrition Counseling: Patient's BMI is greater than 25 and therefore counseled on weight management options. CC: cpx and pap Is Patient Diabetic? No     Last PAP Result normal   CC:  cpx and pap.  History of Present Illness: The pt was asked about all immunizations, health maint. services that are appropriate to their age and was given guidance on diet exercize  and weight management   asthma is stable ear is recovered ( see prior  OV note)  recent divorce due to infidelity requestins screeing labs for std for precaution  Preventive Screening-Counseling & Management  Alcohol-Tobacco     Smoking Status: never     Passive Smoke Exposure: yes     Tobacco Counseling: not indicated; no tobacco use  Problems Prior to Update: 1)  Breast Cyst, Left  (ICD-610.0) 2)  Contact With or Exposure To Venereal Diseases  (ICD-V01.6) 3)  Eustachian Tube Dysfunction  (ICD-381.81) 4)  Chronic Frontal Sinusitis  (ICD-473.1) 5)  Asthma  (ICD-493.90) 6)  Candidiasis, Vaginal  (ICD-112.1) 7)  Breast Mass, Left  (ICD-611.72) 8)  Extrinsic Asthma, Unspecified  (ICD-493.00) 9)  Routine General Medical Exam@health  Care Facl  (ICD-V70.0) 10)  Abnormal Weight Gain  (ICD-783.1) 11)  Anxiety  (ICD-300.00)  Current Problems (verified): 1)  Eustachian Tube Dysfunction  (ICD-381.81) 2)  Chronic Frontal Sinusitis  (ICD-473.1) 3)  Asthma  (ICD-493.90) 4)  Candidiasis, Vaginal  (ICD-112.1) 5)  Breast Mass, Left  (ICD-611.72) 6)  Extrinsic Asthma, Unspecified  (ICD-493.00) 7)  Routine General Medical Exam@health  Care Facl  (ICD-V70.0) 8)  Abnormal Weight Gain   (ICD-783.1) 9)  Anxiety  (ICD-300.00)  Medications Prior to Update: 1)  Proventil Hfa 108 (90 Base) Mcg/act  Aers (Albuterol Sulfate) .... Tewo Puff By Mouth As Needed 2)  Fexofenadine-Pseudoephedrine 60-120 Mg Xr12h-Tab (Fexofenadine-Pseudoephedrine) .... One By Mouth Two Times A Day 3)  Astelin 137 Mcg/spray Soln (Azelastine Hcl) .... One Spray in Each Nostril, Two Times A Day 4)  Veramyst 27.5 Mcg/spray Susp (Fluticasone Furoate) .... Two Spray in Each Nostril Two Times A Day  Current Medications (verified): 1)  Proventil Hfa 108 (90 Base) Mcg/act  Aers (Albuterol Sulfate) .... Tewo Puff By Mouth As Needed 2)  Fexofenadine Hcl 60 Mg Tabs (Fexofenadine Hcl) .Marland Kitchen.. 1 Once Daily As Needed 3)  Astelin 137 Mcg/spray Soln (Azelastine Hcl) .... One Spray in Each Nostril, Two Times A Day 4)  Veramyst 27.5 Mcg/spray Susp (Fluticasone Furoate) .... Two Spray in Each Nostril Two Times A Day  Allergies (verified): No Known Drug Allergies  Past History:  Family History: Last updated: 04/07/2008 mother : well father lung ca DIED  at 60  Social History: Last updated: 11/29/2007 Married Never Smoked Alcohol use-yes Drug use-yes  Risk Factors: Smoking Status: never (04/13/2010) Passive Smoke Exposure: yes (04/13/2010)  Past medical, surgical, family and social histories (including risk factors) reviewed, and no changes noted (except as noted below).  Past Medical History: Reviewed history from 04/07/2008 and no changes  required. Anxiety Asthma  Past Surgical History: Reviewed history from 11/29/2007 and no changes required. Hysterectomy  Family History: Reviewed history from 04/07/2008 and no changes required. mother : well father lung ca DIED  at 82  Social History: Reviewed history from 11/29/2007 and no changes required. Married Never Smoked Alcohol use-yes Drug use-yes  Review of Systems  The patient denies anorexia, fever, weight loss, weight gain, vision loss,  decreased hearing, hoarseness, chest pain, syncope, dyspnea on exertion, peripheral edema, prolonged cough, headaches, hemoptysis, abdominal pain, melena, hematochezia, severe indigestion/heartburn, hematuria, incontinence, genital sores, muscle weakness, suspicious skin lesions, transient blindness, difficulty walking, depression, unusual weight change, abnormal bleeding, enlarged lymph nodes, angioedema, and breast masses.    Physical Exam  General:  alert and well-developed.   Head:  normocephalic and atraumatic.   Eyes:  pupils equal and pupils round.   Ears:  R ear normal and L ear normal.   Nose:  no external deformity and nose piercing noted.   Mouth:  tonsil hypertropied, posterior lymphoid hypertrophy, postnasal drip, and pharyngeal crowding.   Neck:  No deformities, masses, or tenderness noted. Lungs:  normal respiratory effort and no intercostal retractions.   Heart:  normal rate and regular rhythm.   Abdomen:  Bowel sounds positive,abdomen soft and non-tender without masses, organomegaly or hernias noted. Msk:  No deformity or scoliosis noted of thoracic or lumbar spine.   Pulses:  R and L carotid,radial,femoral,dorsalis pedis and posterior tibial pulses are full and equal bilaterally Extremities:  No clubbing, cyanosis, edema, or deformity noted with normal full range of motion of all joints.   Neurologic:  No cranial nerve deficits noted. Station and gait are normal. Plantar reflexes are down-going bilaterally. DTRs are symmetrical throughout. Sensory, motor and coordinative functions appear intact.   Impression & Recommendations:  Problem # 1:  ROUTINE GENERAL MEDICAL EXAM@HEALTH  CARE FACL (ICD-V70.0) The pt was asked about all immunizations, health maint. services that are appropriate to their age and was given guidance on diet exercize  and weight management  Pap smear: normal (04/13/2010) Td Booster: Historical (01/26/2001)   Chol: 191 (03/22/2010)   HDL: 67.40  (03/22/2010)   LDL: 111 (03/22/2010)   TG: 65.0 (03/22/2010) TSH: 1.73 (03/22/2010)   HgbA1C: 5.1 (11/29/2007)    Discussed using sunscreen, use of alcohol, drug use, self breast exam, routine dental care, routine eye care, schedule for GYN exam, routine physical exam, seat belts, multiple vitamins, osteoporosis prevention, adequate calcium intake in diet, recommendations for immunizations, mammograms and Pap smears.  Discussed exercise and checking cholesterol.  Discussed gun safety, safe sex, and contraception.  Problem # 2:  CONTACT WITH OR EXPOSURE TO VENEREAL DISEASES (ICD-V01.6)  Orders: Venipuncture ([address]) T-Hepatitis Acute Panel (60454-09811) T-HIV Antibody  (Reflex) (91478-29562) T-RPR (Syphilis) (13086-57846) Specimen Handling (96295)  Complete Medication List: 1)  Proventil Hfa 108 (90 Base) Mcg/act Aers (Albuterol sulfate) .... Tewo puff by mouth as needed 2)  Fexofenadine Hcl 60 Mg Tabs (Fexofenadine hcl) .Marland Kitchen.. 1 once daily as needed 3)  Astelin 137 Mcg/spray Soln (Azelastine hcl) .... One spray in each nostril, two times a day 4)  Veramyst 27.5 Mcg/spray Susp (Fluticasone furoate) .... Two spray in each nostril two times a day  Other Orders: Radiology Referral (Radiology)  Patient Instructions: 1)  Please schedule a follow-up appointment in 6 months. Prescriptions: PROVENTIL HFA 108 (90 BASE) MCG/ACT  AERS (ALBUTEROL SULFATE) tewo puff by mouth as needed  #18 Gram x 3   Entered by:   Willy Eddy,  LPN   Authorized by:   Stacie Glaze MD   Signed by:   Willy Eddy, LPN on 16/05/9603   Method used:   Electronically to        [facility] [phone]* (retail)       [address] [place], [place]  [address]       Ph: [phone]       Fax: [phone]   RxID:   [id number]    Preventive Care Screening  Pap Smear:    Date:  04/13/2010    Next Due:  01/2011    Results:  normal

## 2010-09-30 NOTE — Assessment & Plan Note (Signed)
Summary: 5 month rov/njr   Vital Signs:  Patient profile:   35 year old female Height:      63 inches Weight:      152 pounds BMI:     27.02 Temp:     98.2 degrees F oral Pulse rate:   72 / minute Resp:     14 per minute BP sitting:   130 / 80  (left arm)  Vitals Entered By: Willy Eddy, LPN (June 18, 2009 10:24 AM)  Contraindications/Deferment of Procedures/Staging:    Test/Procedure: FLU VAX    Reason for deferment: patient declined   CC:  c/o seasonal allergies.  History of Present Illness: Allergy flair as predicted with conjestion, nasal blockage/obstruction post nasal drip nocturnal non productive cough some sneezing currently using zyrtec and flonase without relief, with occasional allerx for eyes symotomiatic today without ever or chills  Asthma History    Initial Asthma Severity Rating:    Age range: 12+ years    Symptoms: 0-2 days/week    Nighttime Awakenings: 0-2/month    Interferes w/ normal activity: no limitations    Asthma Severity Assessment: Intermittent   Problems Prior to Update: 1)  Asthma  (ICD-493.90) 2)  Candidiasis, Vaginal  (ICD-112.1) 3)  Breast Mass, Left  (ICD-611.72) 4)  Extrinsic Asthma, Unspecified  (ICD-493.00) 5)  Routine General Medical Exam@health  Care Facl  (ICD-V70.0) 6)  Abnormal Weight Gain  (ICD-783.1) 7)  Anxiety  (ICD-300.00)  Medications Prior to Update: 1)  Proventil Hfa 108 (90 Base) Mcg/act  Aers (Albuterol Sulfate) .... Tewo Puff By Mouth As Needed 2)  Zyrtec 10 Mg  Chew (Cetirizine Hcl) .... Once Daily As Needed 3)  Fluconazole 150 Mg  Tabs (Fluconazole) .... One By Mouth Now and Repeat in 5 Days  Current Medications (verified): 1)  Proventil Hfa 108 (90 Base) Mcg/act  Aers (Albuterol Sulfate) .... Tewo Puff By Mouth As Needed 2)  Zyrtec 10 Mg  Chew (Cetirizine Hcl) .... Once Daily As Needed  Allergies (verified): No Known Drug Allergies  Past History:  Family History: Last updated:  04/07/2008 mother : well father lung ca DIED  at 4  Social History: Last updated: 11/29/2007 Married Never Smoked Alcohol use-yes Drug use-yes  Risk Factors: Smoking Status: never (11/29/2007) Passive Smoke Exposure: yes (04/07/2008)  Past medical, surgical, family and social histories (including risk factors) reviewed, and no changes noted (except as noted below).  Past Medical History: Reviewed history from 04/07/2008 and no changes required. Anxiety Asthma  Past Surgical History: Reviewed history from 11/29/2007 and no changes required. Hysterectomy  Family History: Reviewed history from 04/07/2008 and no changes required. mother : well father lung ca DIED  at 55  Social History: Reviewed history from 11/29/2007 and no changes required. Married Never Smoked Alcohol use-yes Drug use-yes  Review of Systems       The patient complains of hoarseness and prolonged cough.  The patient denies anorexia, fever, weight loss, weight gain, vision loss, decreased hearing, chest pain, syncope, dyspnea on exertion, peripheral edema, headaches, hemoptysis, abdominal pain, melena, hematochezia, severe indigestion/heartburn, hematuria, incontinence, genital sores, muscle weakness, suspicious skin lesions, transient blindness, difficulty walking, depression, unusual weight change, abnormal bleeding, enlarged lymph nodes, angioedema, and breast masses.    Physical Exam  General:  alert and well-developed.   Head:  normocephalic and atraumatic.   Eyes:  pupils equal and pupils round.   Ears:  R ear normal and L ear normal.   Nose:  mucosal edema and airflow  obstruction.   Mouth:  tonsil hypertropied, posterior lymphoid hypertrophy, postnasal drip, and pharyngeal crowding.   Neck:  No deformities, masses, or tenderness noted. Lungs:  normal respiratory effort and no intercostal retractions.   Heart:  normal rate and regular rhythm.   Abdomen:  Bowel sounds positive,abdomen soft and  non-tender without masses, organomegaly or hernias noted.   Impression & Recommendations:  Problem # 1:  EXTRINSIC ASTHMA, UNSPECIFIED (ICD-493.00) stable Her updated medication list for this problem includes:    Proventil Hfa 108 (90 Base) Mcg/act Aers (Albuterol sulfate) .Marland Kitchen... Tewo puff by mouth as needed  Problem # 2:  CANDIDIASIS, VAGINAL (ICD-112.1)  with every antibiotic she takes! The following medications were removed from the medication list:    Fluconazole 150 Mg Tabs (Fluconazole) ..... One by mouth now and repeat in 5 days Her updated medication list for this problem includes:    Fluconazole 150 Mg Tabs (Fluconazole) ..... One by mouth now and repeat in 5 days  Discussed treatment regimen and preventive measures.   Problem # 3:  CHRONIC FRONTAL SINUSITIS (ICD-473.1)  keep up with the sinus saline lavage Her updated medication list for this problem includes:    Clarithromycin 500 Mg Xr24h-tab (Clarithromycin) ..... One by mouth two times a day    Fexofenadine-pseudoephedrine 60-120 Mg Xr12h-tab (Fexofenadine-pseudoephedrine) ..... One by mouth two times a day  Take antibiotics for full duration. Discussed treatment options including indications for coronal CT scan of sinuses and ENT referral.   Complete Medication List: 1)  Proventil Hfa 108 (90 Base) Mcg/act Aers (Albuterol sulfate) .... Tewo puff by mouth as needed 2)  Zyrtec 10 Mg Chew (Cetirizine hcl) .... Once daily as needed 3)  Fluconazole 150 Mg Tabs (Fluconazole) .... One by mouth now and repeat in 5 days 4)  Clarithromycin 500 Mg Xr24h-tab (Clarithromycin) .... One by mouth two times a day 5)  Fexofenadine-pseudoephedrine 60-120 Mg Xr12h-tab (Fexofenadine-pseudoephedrine) .... One by mouth two times a day  Patient Instructions: 1)  Please schedule a follow-up appointment in 5months. 2)  fall for allergy review Prescriptions: FEXOFENADINE-PSEUDOEPHEDRINE 60-120 MG XR12H-TAB (FEXOFENADINE-PSEUDOEPHEDRINE)  one by mouth two times a day  #60 x 4   Entered and Authorized by:   Stacie Glaze MD   Signed by:   Stacie Glaze MD on 06/18/2009   Method used:   Electronically to        [facility] [phone]* (retail)       [address] [place], [place]  [address]       Ph: [phone]       Fax: [phone]   RxID:   [id number] CLARITHROMYCIN 500 MG XR24H-TAB (CLARITHROMYCIN) one by mouth two times a day  #14 x 0   Entered and Authorized by:   Stacie Glaze MD   Signed by:   Stacie Glaze MD on 06/18/2009   Method used:   Electronically to        [facility] [phone]* (retail)       [address] [place], [place]  [address]       Ph: [phone]       Fax: [phone]   RxID:   [id number] FLUCONAZOLE 150 MG TABS (FLUCONAZOLE) one by mouth now and repeat in 5 days  #2 x 2   Entered and Authorized by:   Stacie Glaze MD   Signed  by:   Stacie Glaze MD on 06/18/2009   Method used:   Electronically to        [facility] [phone]* (retail)       [address] [place], [place]  [address]       Ph: [phone]       Fax: [phone]   RxID:   [id number]

## 2010-09-30 NOTE — Progress Notes (Signed)
Summary: lab results.  Phone Note Call from Patient   Caller: Patient Call For: Dr. Lovell Sheehan Summary of Call: Pt. would like lab results, please. [phone] Initial call taken by: Lynann Beaver CMA,  December 03, 2007 2:07 PM  Follow-up for Phone Call        normal thyroid labs Follow-up by: Stacie Glaze MD,  December 03, 2007 6:05 PM  Additional Follow-up for Phone Call Additional follow up Details #1::        pt informed Additional Follow-up by: Willy Eddy, LPN,  December 04, 2007 7:58 AM

## 2011-01-14 NOTE — Op Note (Signed)
NAMETIPHANIE, VO NO.:  [id number]   MEDICAL RECORD NO.:  [id number]          PATIENT TYPE:  OBV   LOCATION:  NA                            FACILITY:  [facility]   PHYSICIAN:  Juluis Mire, M.D.   DATE OF BIRTH:  February 13, 1976   DATE OF PROCEDURE:  12/07/2004  DATE OF DISCHARGE:                                 OPERATIVE REPORT   PREOPERATIVE DIAGNOSES:  Abnormal cervical cytology with recurrent cervical  dysplasia.   POSTOPERATIVE DIAGNOSES:  Abnormal cervical cytology with recurrent cervical  dysplasia.   OPERATION:  Laparoscopically assisted vaginal hysterectomy.   SURGEON:  Juluis Mire, M.D.   ANESTHESIA:  General endotracheal.   ESTIMATED BLOOD LOSS:  2-300 mL.   PACKS/DRAINS:  None.   BLOOD REPLACED:  None.   COMPLICATIONS:  None.   INDICATIONS FOR PROCEDURE:  Dictated in the history and physical.   DESCRIPTION OF PROCEDURE:  The patient was taken to the OR and placed in the  supine position. After a satisfactory level of general endotracheal  anesthesia was obtained. The patient was placed in the dorsal lithotomy  position using the Allen stirrups. The abdomen, perineum and vagina prepped  out with Betadine. The bladder was emptied by in and out catheterization. A  Hulka tenaculum was put in place. The patient was draped in a sterile field.  A subumbilical incision was made with a knife. The Veress needle was  introduced in the abdominal cavity. The abdomen was inflated to  approximately 3 liters of carbon dioxide. The operating laparoscope was  introduced. There was no evidence of injury to adjacent organs. A 5 mm  trocar was put in place under direct visualization. The appendix was normal.  The upper abdomen including the liver and tip of the gallbladder were clear.  Both lateral gutters were unremarkable. Uterus, tubes and ovaries were  clear. The uterus was enlarged, possible fibroid. Using the gyrus bipolar,  the right adnexa was separated  from the uterus including the uteroovarian  ligament and round ligament. Next, the left adnexa was separated from the  uterus using the bipolar. At this point in time, we had good hemostasis,  separation of the adnexa.   At this point in time, we returned to the vaginal area, legs were  repositioned, a weighted speculum was placed in the vaginal vault, the hulka  tenaculum was then removed. Jacobs tenaculum was used to grasp the cervix,  cul-de-sac was entered sharply. Both uterosacral ligaments were clamped, cut  and suture ligated with #0 Vicryl. The reflection of vaginal mucosa  anteriorly was then incised. The bladder was dissected superiorly.  Retractors put in place to retract the bladder superiorly using the clamp,  cut and tie technique with sutures of #0 Vicryl. The parametrium was  sterilely separated from the sides of the uterus. The uterus was then  flipped, held pedicles were then clamped and cut, uterus passed off the  operative field, held pedicles secured with free tie of #0 Vicryl. The  vaginal mucosa was reapproximated in the midline with interrupted figure-of-  eights of #0 Vicryl.  A uterosacral plication stick was put in place and  secured. At this point in time, we had good hemostasis and approximation. A  Foley was placed to straight drain with retrieval of an adequate amount of  clear urine. A sponge on a sponge stick was placed in the vaginal vault.   The patient's legs were repositioned, abdomen was reinflated with carbon  dioxide, visualization revealed good hemostasis at the cuff and both ovarian  vasculature. We irrigated, no bleeding was encountered. At this point in  time, the abdomen was deflated with carbon dioxide, all trocars removed. The  subumbilical incision was closed with interrupted subcuticular's of 4-0  Vicryl. The suprapubic incision was closed with Dermabond. A sponge on a  sponge stick was removed from the vaginal vault. The patient was taken  out  of the dorsal lithotomy position once alert and extubated and transferred to  the recovery room in good condition. Sponge, needle and instrument count  reported as correct by the circulating nurse x2.      JSM/MEDQ  D:  12/07/2004  T:  12/07/2004  Job:  [id number]

## 2011-01-14 NOTE — H&P (Signed)
NAME:  Alexandra Welch, Alexandra Welch NO.:  [id number]   MEDICAL RECORD NO.:  [id number]          PATIENT TYPE:  OBV   LOCATION:  NA                            FACILITY:  [facility]   PHYSICIAN:  Juluis Mire, M.D.   DATE OF BIRTH:  04-16-1976   DATE OF ADMISSION:  DATE OF DISCHARGE:                                HISTORY & PHYSICAL   The patient is a 35 year old gravida 5, para 2, abortus 3, female who  presents for laparoscopically-assisted vaginal hysterectomy.   In relation to the present admission, the patient had a previous LEEP of the  cervix done in August 2005.  Pathology from the LEEP did reveal low-grade  dysplasia with focal moderate dysplasia, and margins were not involved.  Subsequently she had a Pap smear done in December 2005 that revealed  persistent mild dysplasia as well as high-risk HPV.  She underwent repeat  colposcopy and Pap smear.  It looked like she did have recurrent dysplasia.  Colposcopic-directed biopsy did reveal high-grade dysplasia.  Endocervical  curettings were basically negative.  In view of recurrent dysplasia, options  were discussed.  The patient decided instead of having further treatment of  the cervix to proceed with laparoscopically-assisted vaginal hysterectomy  for management of recurrent cervical dysplasia.  Alternatives have been  discussed, such as repeating the LEEP of the cervix and close follow-up.   In terms of allergies, she has no known drug allergies.   MEDICATIONS:  Xanax as needed.   PAST MEDICAL HISTORY:  The usual childhood diseases without any significant  sequelae.  From a surgical standpoint, she has had three D&E's for  incomplete spontaneous abortions, and she has had two vaginal deliveries.   FAMILY HISTORY:  Noncontributory.   SOCIAL HISTORY:  No tobacco or alcohol use.   REVIEW OF SYSTEMS:  Noncontributory.   PHYSICAL EXAMINATION:  VITAL SIGNS:  The patient is afebrile with stable  vital signs.  HEENT:  The  patient is normocephalic.  Pupils equal, round, and reactive to  light and accommodation.  Extraocular movements were intact.  Sclerae and  conjunctivae were clear.  Oropharynx clear.  NECK:  Without thyromegaly.  BREASTS:  No discrete masses.  CHEST:  Lungs clear.  CARDIAC:  Regular rhythm and rate without murmurs or gallops.  ABDOMEN:  Benign.  No masses, organomegaly or tenderness.  PELVIC:  Normal external genitalia.  Vaginal mucosa is clear.  Cervix  unremarkable.  Uterus felt to be of normal size, shape and contour.  Adnexa  free of masses or tenderness.  EXTREMITIES:  Trace edema.  NEUROLOGIC:  Grossly within normal limits.   IMPRESSION:  Recurrent cervical dysplasia, desirous of hysterectomy.   PLAN:  The patient will undergo a laparoscopically-assisted vaginal  hysterectomy.  Again, alternatives have been discussed, the risks of surgery  explained, including the risk of infection; the risk of hemorrhage that  could require transfusion with the risk of AIDS or hepatitis; the risk of  injury to adjacent organs including bladder, bowel or ureters that could  require further exploratory surgery; the risk of deep venous thrombosis and  pulmonary embolus.  The patient expressed an understanding of the  indications, risks and options.      JSM/MEDQ  D:  12/07/2004  T:  12/07/2004  Job:  [id number]

## 2011-01-14 NOTE — Discharge Summary (Signed)
NAMEKIRSTEIN, BAXLEY NO.:  [id number]   MEDICAL RECORD NO.:  [id number]          PATIENT TYPE:  OBV   LOCATION:  9305                          FACILITY:  [facility]   PHYSICIAN:  Juluis Mire, M.D.   DATE OF BIRTH:  Nov 08, 1975   DATE OF ADMISSION:  12/07/2004  DATE OF DISCHARGE:                                 DISCHARGE SUMMARY   PREOPERATIVE DIAGNOSIS:  Recurrent cervical dysplasia.   DISCHARGE DIAGNOSIS:  Recurrent cervical dysplasia.   OPERATIVE PROCEDURE:  Laparoscopic-assisted vaginal hysterectomy.   For her complete history and physical please see dictated note.   COURSE IN THE HOSPITAL:  The patient underwent a laparoscopic-assisted  vaginal hysterectomy. Pathology is still pending. Postoperatively did well.  Postoperative hemoglobin 9.7. Discharged home on postoperative day #1. At  that time she was tolerating a regular diet. She was ambulating without  difficulty. She had normal bladder function. Abdomen was soft, nontender,  bowel sounds were active. The patient was passing flatus. She had no active  vaginal bleeding.   COMPLICATIONS:  None were encountered during her stay in the hospital. The  patient was discharged home in stable condition.   DISPOSITION:  Routine postoperative instructions were given. She is to avoid  heavy lifting, vaginal entrance, or driving a car. To call us with signs of  infection, nausea, vomiting, increasing abdominal pain, or active vaginal  bleeding. Discharged home on Percocet if she needs it for pain, iron sulfate  supplementation. Reassess in the office in 1 week.      JSM/MEDQ  D:  12/08/2004  T:  12/08/2004  Job:  [id number]

## 2011-04-08 ENCOUNTER — Other Ambulatory Visit: Payer: Self-pay

## 2011-04-11 ENCOUNTER — Other Ambulatory Visit (INDEPENDENT_AMBULATORY_CARE_PROVIDER_SITE_OTHER): Payer: Managed Care, Other (non HMO)

## 2011-04-11 DIAGNOSIS — Z0000 Encounter for general adult medical examination without abnormal findings: Secondary | ICD-10-CM

## 2011-04-11 LAB — CBC WITH DIFFERENTIAL/PLATELET
Basophils Absolute: 0 10*3/uL (ref 0.0–0.1)
Basophils Relative: 0.7 % (ref 0.0–3.0)
Eosinophils Absolute: 0.3 10*3/uL (ref 0.0–0.7)
Eosinophils Relative: 3.7 % (ref 0.0–5.0)
HCT: 40.3 % (ref 36.0–46.0)
Hemoglobin: 13.6 g/dL (ref 12.0–15.0)
Lymphocytes Relative: 22.8 % (ref 12.0–46.0)
Lymphs Abs: 1.6 10*3/uL (ref 0.7–4.0)
MCHC: 33.8 g/dL (ref 30.0–36.0)
MCV: 90.8 fl (ref 78.0–100.0)
Monocytes Absolute: 0.5 10*3/uL (ref 0.1–1.0)
Monocytes Relative: 7.5 % (ref 3.0–12.0)
Neutro Abs: 4.5 10*3/uL (ref 1.4–7.7)
Neutrophils Relative %: 65.3 % (ref 43.0–77.0)
Platelets: 291 10*3/uL (ref 150.0–400.0)
RBC: 4.44 Mil/uL (ref 3.87–5.11)
RDW: 12.7 % (ref 11.5–14.6)
WBC: 6.9 10*3/uL (ref 4.5–10.5)

## 2011-04-11 LAB — BASIC METABOLIC PANEL
BUN: 14 mg/dL (ref 6–23)
CO2: 26 mEq/L (ref 19–32)
Calcium: 8.8 mg/dL (ref 8.4–10.5)
Chloride: 104 mEq/L (ref 96–112)
Creatinine, Ser: 0.9 mg/dL (ref 0.4–1.2)
GFR: 79.88 mL/min (ref >60.00)
Glucose, Bld: 85 mg/dL (ref 70–99)
Potassium: 3.6 mEq/L (ref 3.5–5.1)
Sodium: 138 mEq/L (ref 135–145)

## 2011-04-11 LAB — HEPATIC FUNCTION PANEL
ALT: 16 U/L (ref 0–35)
AST: 19 U/L (ref 0–37)
Albumin: 4.1 g/dL (ref 3.5–5.2)
Alkaline Phosphatase: 35 U/L — ABNORMAL LOW (ref 39–117)
Bilirubin, Direct: 0.1 mg/dL (ref 0.0–0.3)
Total Bilirubin: 0.8 mg/dL (ref 0.3–1.2)
Total Protein: 7.1 g/dL (ref 6.0–8.3)

## 2011-04-11 LAB — POCT URINALYSIS DIPSTICK
Bilirubin, UA: NEGATIVE
Blood, UA: NEGATIVE
Glucose, UA: NEGATIVE
Ketones, UA: NEGATIVE
Leukocytes, UA: NEGATIVE
Nitrite, UA: NEGATIVE
Protein, UA: NEGATIVE
Spec Grav, UA: 1.025
Urobilinogen, UA: 0.2
pH, UA: 6

## 2011-04-11 LAB — LIPID PANEL
Cholesterol: 180 mg/dL (ref 0–200)
HDL: 85.3 mg/dL (ref >39.00)
LDL Cholesterol: 85 mg/dL (ref 0–99)
Total CHOL/HDL Ratio: 2
Triglycerides: 49 mg/dL (ref 0.0–149.0)
VLDL: 9.8 mg/dL (ref 0.0–40.0)

## 2011-04-11 LAB — TSH: TSH: 1.09 u[IU]/mL (ref 0.35–5.50)

## 2011-04-12 ENCOUNTER — Encounter: Payer: Self-pay | Admitting: Internal Medicine

## 2011-04-15 ENCOUNTER — Other Ambulatory Visit (HOSPITAL_COMMUNITY)
Admission: RE | Admit: 2011-04-15 | Discharge: 2011-04-15 | Disposition: A | Discharge status: Home / Self Care | Payer: Managed Care, Other (non HMO) | Source: Ambulatory Visit | Attending: Internal Medicine | Admitting: Internal Medicine

## 2011-04-15 ENCOUNTER — Encounter: Payer: Self-pay | Admitting: Internal Medicine

## 2011-04-15 ENCOUNTER — Ambulatory Visit (INDEPENDENT_AMBULATORY_CARE_PROVIDER_SITE_OTHER): Payer: Managed Care, Other (non HMO) | Admitting: Internal Medicine

## 2011-04-15 VITALS — BP 120/82 | HR 84 | Temp 98.2°F | Resp 16 | Ht 64.0 in | Wt 146.0 lb

## 2011-04-15 DIAGNOSIS — Z0000 Encounter for general adult medical examination without abnormal findings: Secondary | ICD-10-CM

## 2011-04-15 DIAGNOSIS — Z01419 Encounter for gynecological examination (general) (routine) without abnormal findings: Secondary | ICD-10-CM | POA: Insufficient documentation

## 2011-04-15 DIAGNOSIS — Z23 Encounter for immunization: Secondary | ICD-10-CM

## 2011-04-15 MED ORDER — ALBUTEROL SULFATE HFA 108 (90 BASE) MCG/ACT IN AERS: 2.0000 | INHALATION_SPRAY | Freq: Two times a day (BID) | RESPIRATORY_TRACT | Status: DC | PRN

## 2011-04-15 NOTE — Progress Notes (Signed)
Addended by: Willy Eddy on: 04/15/2011 05:03 PM   Modules accepted: Orders

## 2011-04-15 NOTE — Progress Notes (Signed)
  Subjective:    Patient ID: Alexandra Welch, female    DOB: 10-29-1975, 35 y.o.   MRN: [id number]  HPI cpx Pap and pelvic   Review of Systems  Constitutional: Negative for activity change, appetite change and fatigue.  HENT: Negative for ear pain, congestion, neck pain, postnasal drip and sinus pressure.   Eyes: Negative for redness and visual disturbance.  Respiratory: Negative for cough, shortness of breath and wheezing.   Gastrointestinal: Negative for abdominal pain and abdominal distention.  Genitourinary: Negative for dysuria, frequency and menstrual problem.  Musculoskeletal: Negative for myalgias, joint swelling and arthralgias.  Skin: Negative for rash and wound.  Neurological: Negative for dizziness, weakness and headaches.  Hematological: Negative for adenopathy. Does not bruise/bleed easily.  Psychiatric/Behavioral: Negative for sleep disturbance and decreased concentration.       Objective:   Physical Exam  Nursing note and vitals reviewed. Constitutional: She is oriented to person, place, and time. She appears well-developed and well-nourished. No distress.  HENT:  Head: Normocephalic and atraumatic.  Right Ear: External ear normal.  Left Ear: External ear normal.  Nose: Nose normal.  Mouth/Throat: Oropharynx is clear and moist.  Eyes: Conjunctivae and EOM are normal. Pupils are equal, round, and reactive to light.  Neck: Normal range of motion. Neck supple. No JVD present. No tracheal deviation present. No thyromegaly present.  Cardiovascular: Normal rate, regular rhythm, normal heart sounds and intact distal pulses.   No murmur heard. Pulmonary/Chest: Effort normal and breath sounds normal. She has no wheezes. She exhibits no tenderness.  Abdominal: Soft. Bowel sounds are normal.  Genitourinary: Vagina normal. No breast swelling or tenderness. There is no rash or tenderness on the right labia. There is no rash or tenderness on the left labia.  Musculoskeletal:  Normal range of motion. She exhibits no edema and no tenderness.  Lymphadenopathy:    She has no cervical adenopathy.  Neurological: She is alert and oriented to person, place, and time. She has normal reflexes. No cranial nerve deficit.  Skin: Skin is warm and dry. She is not diaphoretic.  Psychiatric: She has a normal mood and affect. Her behavior is normal.          Assessment & Plan:   This is a routine physical examination for this healthy  Female. Reviewed all health maintenance protocols including mammography colonoscopy bone density and reviewed appropriate screening labs. Her immunization history was reviewed as well as her current medications and allergies refills of her chronic medications were given and the plan for yearly health maintenance was discussed all orders and referrals were made as appropriate.

## 2011-04-27 NOTE — Progress Notes (Signed)
Quick Note:    Pap is normal  ______

## 2012-04-09 ENCOUNTER — Other Ambulatory Visit (INDEPENDENT_AMBULATORY_CARE_PROVIDER_SITE_OTHER): Payer: Managed Care, Other (non HMO)

## 2012-04-09 DIAGNOSIS — Z0000 Encounter for general adult medical examination without abnormal findings: Secondary | ICD-10-CM

## 2012-04-09 LAB — HEPATIC FUNCTION PANEL
ALT: 17 U/L (ref 0–35)
AST: 19 U/L (ref 0–37)
Albumin: 3.9 g/dL (ref 3.5–5.2)
Alkaline Phosphatase: 32 U/L — ABNORMAL LOW (ref 39–117)
Bilirubin, Direct: 0 mg/dL (ref 0.0–0.3)
Total Bilirubin: 0.8 mg/dL (ref 0.3–1.2)
Total Protein: 7.4 g/dL (ref 6.0–8.3)

## 2012-04-09 LAB — CBC WITH DIFFERENTIAL/PLATELET
Basophils Absolute: 0 10*3/uL (ref 0.0–0.1)
Basophils Relative: 0.7 % (ref 0.0–3.0)
Eosinophils Absolute: 0.2 10*3/uL (ref 0.0–0.7)
Eosinophils Relative: 3.5 % (ref 0.0–5.0)
HCT: 40.1 % (ref 36.0–46.0)
Hemoglobin: 13.4 g/dL (ref 12.0–15.0)
Lymphocytes Relative: 26.5 % (ref 12.0–46.0)
Lymphs Abs: 1.6 10*3/uL (ref 0.7–4.0)
MCHC: 33.5 g/dL (ref 30.0–36.0)
MCV: 89.2 fl (ref 78.0–100.0)
Monocytes Absolute: 0.5 10*3/uL (ref 0.1–1.0)
Monocytes Relative: 8 % (ref 3.0–12.0)
Neutro Abs: 3.6 10*3/uL (ref 1.4–7.7)
Neutrophils Relative %: 61.3 % (ref 43.0–77.0)
Platelets: 286 10*3/uL (ref 150.0–400.0)
RBC: 4.5 Mil/uL (ref 3.87–5.11)
RDW: 12.7 % (ref 11.5–14.6)
WBC: 5.9 10*3/uL (ref 4.5–10.5)

## 2012-04-09 LAB — BASIC METABOLIC PANEL
BUN: 16 mg/dL (ref 6–23)
CO2: 26 mEq/L (ref 19–32)
Calcium: 9 mg/dL (ref 8.4–10.5)
Chloride: 104 mEq/L (ref 96–112)
Creatinine, Ser: 0.7 mg/dL (ref 0.4–1.2)
GFR: 97.79 mL/min (ref >60.00)
Glucose, Bld: 74 mg/dL (ref 70–99)
Potassium: 3.9 mEq/L (ref 3.5–5.1)
Sodium: 138 mEq/L (ref 135–145)

## 2012-04-09 LAB — POCT URINALYSIS DIPSTICK
Bilirubin, UA: NEGATIVE
Blood, UA: NEGATIVE
Glucose, UA: NEGATIVE
Ketones, UA: NEGATIVE
Leukocytes, UA: NEGATIVE
Nitrite, UA: NEGATIVE
Protein, UA: NEGATIVE
Spec Grav, UA: 1.02
Urobilinogen, UA: 0.2
pH, UA: 5.5

## 2012-04-09 LAB — TSH: TSH: 1.05 u[IU]/mL (ref 0.35–5.50)

## 2012-04-09 LAB — LIPID PANEL
Cholesterol: 202 mg/dL — ABNORMAL HIGH (ref 0–200)
HDL: 69.1 mg/dL (ref >39.00)
Total CHOL/HDL Ratio: 3
Triglycerides: 49 mg/dL (ref 0.0–149.0)
VLDL: 9.8 mg/dL (ref 0.0–40.0)

## 2012-04-09 LAB — LDL CHOLESTEROL, DIRECT: Direct LDL: 122.2 mg/dL

## 2012-04-16 ENCOUNTER — Ambulatory Visit (INDEPENDENT_AMBULATORY_CARE_PROVIDER_SITE_OTHER): Payer: Managed Care, Other (non HMO) | Admitting: Internal Medicine

## 2012-04-16 ENCOUNTER — Encounter: Payer: Self-pay | Admitting: Internal Medicine

## 2012-04-16 VITALS — BP 134/86 | HR 72 | Temp 98.0°F | Resp 16 | Ht 63.0 in | Wt 158.0 lb

## 2012-04-16 DIAGNOSIS — Z0000 Encounter for general adult medical examination without abnormal findings: Secondary | ICD-10-CM

## 2012-04-16 NOTE — Progress Notes (Signed)
  Subjective:    Patient ID: Alexandra Welch, female    DOB: November 06, 1975, 36 y.o.   MRN: [id number]  HPIcpx Weight gain noted    Review of Systems  Constitutional: Negative for activity change, appetite change and fatigue.  HENT: Negative for ear pain, congestion, neck pain, postnasal drip and sinus pressure.   Eyes: Negative for redness and visual disturbance.  Respiratory: Negative for cough, shortness of breath and wheezing.   Gastrointestinal: Negative for abdominal pain and abdominal distention.  Genitourinary: Negative for dysuria, frequency and menstrual problem.  Musculoskeletal: Negative for myalgias, joint swelling and arthralgias.  Skin: Negative for rash and wound.  Neurological: Negative for dizziness, weakness and headaches.  Hematological: Negative for adenopathy. Does not bruise/bleed easily.  Psychiatric/Behavioral: Negative for disturbed wake/sleep cycle and decreased concentration.       Objective:   Physical Exam  Nursing note and vitals reviewed. Constitutional: She is oriented to person, place, and time. She appears well-developed and well-nourished. No distress.  HENT:  Head: Normocephalic and atraumatic.  Right Ear: External ear normal.  Left Ear: External ear normal.  Nose: Nose normal.  Mouth/Throat: Oropharynx is clear and moist.  Eyes: Conjunctivae and EOM are normal. Pupils are equal, round, and reactive to light.  Neck: Normal range of motion. Neck supple. No JVD present. No tracheal deviation present. No thyromegaly present.  Cardiovascular: Normal rate, regular rhythm, normal heart sounds and intact distal pulses.   No murmur heard. Pulmonary/Chest: Effort normal and breath sounds normal. She has no wheezes. She exhibits no tenderness.  Abdominal: Soft. Bowel sounds are normal.  Musculoskeletal: Normal range of motion. She exhibits no edema and no tenderness.  Lymphadenopathy:    She has no cervical adenopathy.  Neurological: She is alert and  oriented to person, place, and time. She has normal reflexes. No cranial nerve deficit.  Skin: Skin is warm and dry. She is not diaphoretic.  Psychiatric: She has a normal mood and affect. Her behavior is normal.    Pap and breast exam normal      Assessment & Plan:   This is a routine physical examination for this healthy  Female. Reviewed all health maintenance protocols including mammography colonoscopy bone density and reviewed appropriate screening labs. Her immunization history was reviewed as well as her current medications and allergies refills of her chronic medications were given and the plan for yearly health maintenance was discussed all orders and referrals were made as appropriate.

## 2012-04-17 ENCOUNTER — Other Ambulatory Visit (HOSPITAL_COMMUNITY)
Admission: RE | Admit: 2012-04-17 | Discharge: 2012-04-17 | Disposition: A | Discharge status: Home / Self Care | Payer: Managed Care, Other (non HMO) | Source: Ambulatory Visit | Attending: Internal Medicine | Admitting: Internal Medicine

## 2012-04-17 DIAGNOSIS — Z01419 Encounter for gynecological examination (general) (routine) without abnormal findings: Secondary | ICD-10-CM | POA: Insufficient documentation

## 2012-04-17 NOTE — Addendum Note (Signed)
Addended by: Willy Eddy on: 04/17/2012 09:06 AM   Modules accepted: Orders

## 2012-04-27 ENCOUNTER — Telehealth: Payer: Self-pay | Admitting: Internal Medicine

## 2012-04-27 NOTE — Telephone Encounter (Signed)
Pt called req to get results from pap on 04/17/12. Pls call.

## 2012-05-01 NOTE — Telephone Encounter (Signed)
Left a message for pt to return call 

## 2012-05-01 NOTE — Telephone Encounter (Signed)
Call pt - PAP is normal.  Results printed.  Please mail to pt.

## 2012-05-01 NOTE — Telephone Encounter (Signed)
Pls advise on pap report.  Pt is aware that PCP is out of office and would really like results due to having problems in the past.

## 2012-05-02 NOTE — Telephone Encounter (Signed)
Called and spoke with pt and pt is aware.  

## 2012-08-13 ENCOUNTER — Other Ambulatory Visit: Payer: Managed Care, Other (non HMO)

## 2012-08-20 ENCOUNTER — Encounter: Payer: Managed Care, Other (non HMO) | Admitting: Internal Medicine

## 2012-08-20 DIAGNOSIS — Z0289 Encounter for other administrative examinations: Secondary | ICD-10-CM

## 2012-09-09 NOTE — Progress Notes (Signed)
  Subjective:    Patient ID: Alexandra Welch, female    DOB: 01/12/1976, 37 y.o.   MRN: [id number]  HPI Not no show this was a rescheduled patient opened in error   Review of Systems     Objective:   Physical Exam        Assessment & Plan:

## 2013-08-12 ENCOUNTER — Other Ambulatory Visit (INDEPENDENT_AMBULATORY_CARE_PROVIDER_SITE_OTHER): Payer: Managed Care, Other (non HMO)

## 2013-08-12 DIAGNOSIS — Z0000 Encounter for general adult medical examination without abnormal findings: Secondary | ICD-10-CM

## 2013-08-12 LAB — POCT URINALYSIS DIPSTICK
Bilirubin, UA: NEGATIVE
Blood, UA: NEGATIVE
Glucose, UA: NEGATIVE
Ketones, UA: NEGATIVE
Leukocytes, UA: NEGATIVE
Nitrite, UA: NEGATIVE
Protein, UA: NEGATIVE
Spec Grav, UA: 1.02
Urobilinogen, UA: 0.2
pH, UA: 6

## 2013-08-12 LAB — HEPATIC FUNCTION PANEL
ALT: 20 U/L (ref 0–35)
AST: 20 U/L (ref 0–37)
Albumin: 4.1 g/dL (ref 3.5–5.2)
Alkaline Phosphatase: 37 U/L — ABNORMAL LOW (ref 39–117)
Bilirubin, Direct: 0.1 mg/dL (ref 0.0–0.3)
Total Bilirubin: 0.8 mg/dL (ref 0.3–1.2)
Total Protein: 6.7 g/dL (ref 6.0–8.3)

## 2013-08-12 LAB — CBC WITH DIFFERENTIAL/PLATELET
Basophils Absolute: 0 10*3/uL (ref 0.0–0.1)
Basophils Relative: 0.8 % (ref 0.0–3.0)
Eosinophils Absolute: 0.3 10*3/uL (ref 0.0–0.7)
Eosinophils Relative: 4.9 % (ref 0.0–5.0)
HCT: 39.5 % (ref 36.0–46.0)
Hemoglobin: 13.5 g/dL (ref 12.0–15.0)
Lymphocytes Relative: 31.1 % (ref 12.0–46.0)
Lymphs Abs: 1.9 10*3/uL (ref 0.7–4.0)
MCHC: 34.1 g/dL (ref 30.0–36.0)
MCV: 87.3 fl (ref 78.0–100.0)
Monocytes Absolute: 0.5 10*3/uL (ref 0.1–1.0)
Monocytes Relative: 7.8 % (ref 3.0–12.0)
Neutro Abs: 3.5 10*3/uL (ref 1.4–7.7)
Neutrophils Relative %: 55.4 % (ref 43.0–77.0)
Platelets: 325 10*3/uL (ref 150.0–400.0)
RBC: 4.53 Mil/uL (ref 3.87–5.11)
RDW: 13.1 % (ref 11.5–14.6)
WBC: 6.2 10*3/uL (ref 4.5–10.5)

## 2013-08-12 LAB — LIPID PANEL
Cholesterol: 215 mg/dL — ABNORMAL HIGH (ref 0–200)
HDL: 67.4 mg/dL (ref >39.00)
Total CHOL/HDL Ratio: 3
Triglycerides: 39 mg/dL (ref 0.0–149.0)
VLDL: 7.8 mg/dL (ref 0.0–40.0)

## 2013-08-12 LAB — BASIC METABOLIC PANEL
BUN: 12 mg/dL (ref 6–23)
CO2: 27 mEq/L (ref 19–32)
Calcium: 9.2 mg/dL (ref 8.4–10.5)
Chloride: 105 mEq/L (ref 96–112)
Creatinine, Ser: 0.9 mg/dL (ref 0.4–1.2)
GFR: 77.84 mL/min (ref >60.00)
Glucose, Bld: 71 mg/dL (ref 70–99)
Potassium: 4 mEq/L (ref 3.5–5.1)
Sodium: 138 mEq/L (ref 135–145)

## 2013-08-12 LAB — TSH: TSH: 2.3 u[IU]/mL (ref 0.35–5.50)

## 2013-08-12 LAB — LDL CHOLESTEROL, DIRECT: Direct LDL: 136.5 mg/dL

## 2013-08-19 ENCOUNTER — Encounter: Payer: Managed Care, Other (non HMO) | Admitting: Internal Medicine

## 2013-08-26 ENCOUNTER — Ambulatory Visit (INDEPENDENT_AMBULATORY_CARE_PROVIDER_SITE_OTHER): Payer: Managed Care, Other (non HMO) | Admitting: Internal Medicine

## 2013-08-26 ENCOUNTER — Encounter: Payer: Self-pay | Admitting: Internal Medicine

## 2013-08-26 VITALS — BP 130/80 | HR 88 | Temp 99.1°F | Resp 16 | Ht 63.0 in | Wt 152.0 lb

## 2013-08-26 DIAGNOSIS — Z0000 Encounter for general adult medical examination without abnormal findings: Secondary | ICD-10-CM

## 2013-08-26 DIAGNOSIS — Z124 Encounter for screening for malignant neoplasm of cervix: Secondary | ICD-10-CM

## 2013-08-26 MED ORDER — ESCITALOPRAM OXALATE 10 MG PO TABS: 10.0000 mg | ORAL_TABLET | Freq: Every day | ORAL | Status: DC

## 2013-08-26 NOTE — Progress Notes (Signed)
   Subjective:    Patient ID: Alexandra Welch, female    DOB: Jan 31, 1976, 37 y.o.   MRN: [id number]  HPI CPX   Review of Systems  Constitutional: Negative for activity change, appetite change and fatigue.  HENT: Negative for congestion, ear pain, postnasal drip and sinus pressure.   Eyes: Negative for redness and visual disturbance.  Respiratory: Negative for cough, shortness of breath and wheezing.   Gastrointestinal: Negative for abdominal pain and abdominal distention.  Genitourinary: Negative for dysuria, frequency and menstrual problem.  Musculoskeletal: Negative for arthralgias, joint swelling, myalgias and neck pain.  Skin: Negative for rash and wound.  Neurological: Negative for dizziness, weakness and headaches.  Hematological: Negative for adenopathy. Does not bruise/bleed easily.  Psychiatric/Behavioral: Negative for sleep disturbance and decreased concentration.       Objective:   Physical Exam  Constitutional: She is oriented to person, place, and time. She appears well-developed and well-nourished. No distress.  HENT:  Head: Normocephalic and atraumatic.  Right Ear: External ear normal.  Left Ear: External ear normal.  Nose: Nose normal.  Mouth/Throat: Oropharynx is clear and moist.  Eyes: Conjunctivae and EOM are normal. Pupils are equal, round, and reactive to light.  Neck: Normal range of motion. Neck supple. No JVD present. No tracheal deviation present. No thyromegaly present.  Cardiovascular: Normal rate, regular rhythm, normal heart sounds and intact distal pulses.   No murmur heard. Pulmonary/Chest: Effort normal and breath sounds normal. She has no wheezes. She exhibits no tenderness.  Abdominal: Soft. Bowel sounds are normal.  Genitourinary: There is breast tenderness. There is no rash, tenderness or injury on the right labia. There is no rash, tenderness or injury on the left labia.    Musculoskeletal: Normal range of motion. She exhibits no edema and no  tenderness.  Lymphadenopathy:    She has no cervical adenopathy.  Neurological: She is alert and oriented to person, place, and time. She has normal reflexes. No cranial nerve deficit.  Skin: Skin is warm and dry. She is not diaphoretic.  Psychiatric: She has a normal mood and affect. Her behavior is normal.          Assessment & Plan:   Patient presents for yearly preventative medicine examination.   all immunizations and health maintenance protocols were reviewed with the patient and they are up to date with these protocols.   screening laboratory values were reviewed with the patient including screening of hyperlipidemia PSA renal function and hepatic function.   There medications past medical history social history problem list and allergies were reviewed in detail.   Goals were established with regard to weight loss exercise diet in compliance with medications Anxiety Trial of lexapro

## 2013-08-26 NOTE — Progress Notes (Signed)
Pre visit review using our clinic review tool, if applicable. No additional management support is needed unless otherwise documented below in the visit note. 

## 2013-08-27 ENCOUNTER — Other Ambulatory Visit: Payer: Self-pay

## 2013-08-27 ENCOUNTER — Other Ambulatory Visit (HOSPITAL_COMMUNITY)
Admission: RE | Admit: 2013-08-27 | Discharge: 2013-08-27 | Disposition: A | Discharge status: Home / Self Care | Payer: Managed Care, Other (non HMO) | Source: Ambulatory Visit | Attending: Internal Medicine | Admitting: Internal Medicine

## 2013-08-27 DIAGNOSIS — Z1231 Encounter for screening mammogram for malignant neoplasm of breast: Secondary | ICD-10-CM

## 2013-08-27 DIAGNOSIS — Z01419 Encounter for gynecological examination (general) (routine) without abnormal findings: Secondary | ICD-10-CM | POA: Insufficient documentation

## 2013-08-27 NOTE — Addendum Note (Signed)
Addended by: Willy Eddy on: 08/27/2013 02:22 PM   Modules accepted: Orders

## 2013-09-02 ENCOUNTER — Ambulatory Visit
Admission: RE | Admit: 2013-09-02 | Discharge: 2013-09-02 | Disposition: A | Discharge status: Home / Self Care | Payer: Managed Care, Other (non HMO) | Source: Ambulatory Visit

## 2013-09-02 DIAGNOSIS — Z1231 Encounter for screening mammogram for malignant neoplasm of breast: Secondary | ICD-10-CM

## 2014-08-27 ENCOUNTER — Telehealth: Payer: Self-pay | Admitting: Family Medicine

## 2014-08-27 ENCOUNTER — Ambulatory Visit (INDEPENDENT_AMBULATORY_CARE_PROVIDER_SITE_OTHER): Payer: Managed Care, Other (non HMO) | Admitting: Family Medicine

## 2014-08-27 ENCOUNTER — Encounter: Payer: Self-pay | Admitting: Family Medicine

## 2014-08-27 VITALS — BP 130/80 | HR 83 | Temp 98.1°F | Ht 63.0 in | Wt 165.8 lb

## 2014-08-27 DIAGNOSIS — N939 Abnormal uterine and vaginal bleeding, unspecified: Secondary | ICD-10-CM

## 2014-08-27 DIAGNOSIS — Z7689 Persons encountering health services in other specified circumstances: Secondary | ICD-10-CM

## 2014-08-27 DIAGNOSIS — Z23 Encounter for immunization: Secondary | ICD-10-CM

## 2014-08-27 DIAGNOSIS — J4520 Mild intermittent asthma, uncomplicated: Secondary | ICD-10-CM

## 2014-08-27 DIAGNOSIS — F411 Generalized anxiety disorder: Secondary | ICD-10-CM

## 2014-08-27 DIAGNOSIS — N6019 Diffuse cystic mastopathy of unspecified breast: Secondary | ICD-10-CM

## 2014-08-27 DIAGNOSIS — Z7189 Other specified counseling: Secondary | ICD-10-CM

## 2014-08-27 DIAGNOSIS — E663 Overweight: Secondary | ICD-10-CM

## 2014-08-27 HISTORY — DX: Diffuse cystic mastopathy of unspecified breast: N60.19

## 2014-08-27 LAB — HEMOGLOBIN A1C: Hgb A1c MFr Bld: 5.4 % (ref 4.6–6.5)

## 2014-08-27 LAB — LIPID PANEL
Cholesterol: 234 mg/dL — ABNORMAL HIGH (ref 0–200)
HDL: 73.1 mg/dL (ref >39.00)
LDL Cholesterol: 151 mg/dL — ABNORMAL HIGH (ref 0–99)
NonHDL: 160.9
Total CHOL/HDL Ratio: 3
Triglycerides: 51 mg/dL (ref 0.0–149.0)
VLDL: 10.2 mg/dL (ref 0.0–40.0)

## 2014-08-27 MED ORDER — ALBUTEROL SULFATE HFA 108 (90 BASE) MCG/ACT IN AERS: 2.0000 | INHALATION_SPRAY | Freq: Two times a day (BID) | RESPIRATORY_TRACT | Status: DC | PRN

## 2014-08-27 NOTE — Telephone Encounter (Signed)
Ok to place referral for vag bleeding.

## 2014-08-27 NOTE — Progress Notes (Signed)
Pre visit review using our clinic review tool, if applicable. No additional management support is needed unless otherwise documented below in the visit note. 

## 2014-08-27 NOTE — Telephone Encounter (Signed)
Patient states when she called to make an appointment with [facility] and was told their next opening was in March.  She said they told her that if Dr. Maudie Mercury puts in a referral, they could probably get her in sooner.  Patient would a referral entered.

## 2014-08-27 NOTE — Telephone Encounter (Signed)
Referral placed and I called the pt and informed her of this. 

## 2014-08-27 NOTE — Addendum Note (Signed)
Addended by: Agnes Lawrence on: 08/27/2014 11:55 AM   Modules accepted: Orders

## 2014-08-27 NOTE — Progress Notes (Signed)
HPI:  Alexandra Welch is here to establish care.   Has the following chronic problems that require follow up and concerns today:  Anxiety with panic disorder: -meds: none, on zoloft and many medications in the past - none now -hx: counseling in the past -denies: SI, depression  Mild Intermittent asthma: -meds: alb -reports: mild, rarely uses alb - needs refill -denies: SOb, wheezing  Vag bleeding: -small amount yesterday - she is sure was vaginal -she had sex the night before and had not been active prior for over one month -no pruritis, discharge otherwise or pain, no dysparunia -hx cervical dysplasia s/p hysterectomy at age 38, has had pap of cuff since yearly with PCP and reports always normal - wonders if needs to continue  HM: -needs flu vaccine  ROS negative for unless reported above: fevers, unintentional weight loss, hearing or vision loss, chest pain, palpitations, struggling to breath, hemoptysis, melena, hematochezia, hematuria, falls, loc, si, thoughts of self harm  Past Medical History  Diagnosis Date  . Anxiety   . Asthma   . Fibrocystic breast changes 08/27/2014    No hx breast biopsy. No FH first degree relative with breast or ovarian ca. Getting yearly mammograms per prior PCP recs.     Past Surgical History  Procedure Laterality Date  . Abdominal hysterectomy      for cervical dysplasia    Family History  Problem Relation Age of Onset  . Lung cancer Father     smoker and worked in [place]  . Marital Status: Divorced    Spouse Name: N/A    Number of Children: N/A  . Years of Education: N/A   Social History Main Topics  . Smoking status: Never Smoker   . Smokeless tobacco: None  . Alcohol Use: Yes     Comment: rare - 1 drink  . Drug Use: No  . Sexual Activity: None   Other Topics Concern  . None   Social History Narrative   Work or School: Scientist, clinical (histocompatibility and immunogenetics) [employer]      Home Situation: lives with  husband and two older children      Spiritual Beliefs: [employer]      Lifestyle: exercise is sporadic; diet is ok          Current outpatient prescriptions: albuterol (PROVENTIL HFA;VENTOLIN HFA) 108 (90 BASE) MCG/ACT inhaler, Inhale 2 puffs into the lungs 2 (two) times daily as needed., Disp: 3.7 g, Rfl: 6  EXAM:  Filed Vitals:   08/27/14 0825  BP: 130/80  Pulse: 83  Temp: 98.1 F (36.7 C)    Body mass index is 29.38 kg/(m^2).  GENERAL: vitals reviewed and listed above, alert, oriented, appears well hydrated and in no acute distress  HEENT: atraumatic, conjunttiva clear, no obvious abnormalities on inspection of external nose and ears  NECK: no obvious masses on inspection  LUNGS: clear to auscultation bilaterally, no wheezes, rales or rhonchi, good air movement  CV: HRRR, no peripheral edema  MS: moves all extremities without noticeable abnormality  PSYCH: pleasant and cooperative, no obvious depression or anxiety  ASSESSMENT AND PLAN:  Discussed the following assessment and plan:  Vaginal bleeding -we discussed possible serious and likely etiologies, workup and treatment, treatment risks and return precautions -after this discussion, Alexandra Welch opted for eval with gyn (she will call to schedule) as she is concerned for vaginal dysplasia - more likely irritation from intercourse, vaginal atrophy -follow up advised as needed  after gyn eval -of course, we advised [name]  to return or notify a doctor immediately if symptoms worsen or persist or new concerns arise.  Encounter to establish care  Mild intermittent asthma, uncomplicated  Anxiety state  Overweight - Plan: Hemoglobin A1c, Lipid Panel  Fibrocystic breast changes, unspecified laterality  -We reviewed the PMH, PSH, FH, SH, Meds and Allergies. -We provided refills for any medications we will prescribe as needed. -We addressed current concerns per orders and patient instructions. -We have asked for records  for pertinent exams, studies, vaccines and notes from previous providers. -We have advised patient to follow up per instructions below.   -Patient advised to return or notify a doctor immediately if symptoms worsen or persist or new concerns arise.  There are no Patient Instructions on file for this visit.   Colin Benton R.

## 2014-08-27 NOTE — Patient Instructions (Signed)
BEFORE YOU LEAVE: -flu vaccine -labs  See gynecologist for your concerns regarding the vaginal bleeding  Follow up here yearly or as needed  -We have ordered labs or studies at this visit. It can take up to 1-2 weeks for results and processing. We will contact you with instructions IF your results are abnormal. Normal results will be released to your [facility]. If you have not heard from [place] or can not find your results in [facility] in 2 weeks please contact our office.  -PLEASE SIGN UP FOR MYCHART TODAY   We recommend the following healthy lifestyle measures: - eat a healthy diet consisting of lots of vegetables, fruits, beans, nuts, seeds, healthy meats such as white chicken and fish and whole grains.  - avoid fried foods, fast food, processed foods, sodas, red meet and other fattening foods.  - get a least 150 minutes of aerobic exercise per week.

## 2014-08-27 NOTE — Addendum Note (Signed)
Addended by: Agnes Lawrence on: 08/27/2014 09:06 AM   Modules accepted: Orders

## 2014-09-04 ENCOUNTER — Encounter: Payer: Self-pay | Admitting: Obstetrics & Gynecology

## 2014-09-04 ENCOUNTER — Ambulatory Visit (INDEPENDENT_AMBULATORY_CARE_PROVIDER_SITE_OTHER): Payer: Managed Care, Other (non HMO) | Admitting: Obstetrics & Gynecology

## 2014-09-04 VITALS — BP 130/82 | HR 68 | Resp 16 | Ht 62.5 in | Wt 165.0 lb

## 2014-09-04 DIAGNOSIS — D1800 Hemangioma unspecified site: Secondary | ICD-10-CM

## 2014-09-04 DIAGNOSIS — Z124 Encounter for screening for malignant neoplasm of cervix: Secondary | ICD-10-CM

## 2014-09-04 DIAGNOSIS — N939 Abnormal uterine and vaginal bleeding, unspecified: Secondary | ICD-10-CM
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
## 2014-09-05 LAB — IPS PAP TEST WITH REFLEX TO HPV

## 2014-10-02 ENCOUNTER — Other Ambulatory Visit: Payer: Self-pay

## 2014-10-02 DIAGNOSIS — Z1231 Encounter for screening mammogram for malignant neoplasm of breast: Secondary | ICD-10-CM

## 2014-10-08 ENCOUNTER — Ambulatory Visit: Payer: Managed Care, Other (non HMO)

## 2014-10-20 ENCOUNTER — Ambulatory Visit
Admission: RE | Admit: 2014-10-20 | Discharge: 2014-10-20 | Disposition: A | Discharge status: Home / Self Care | Payer: Managed Care, Other (non HMO) | Source: Ambulatory Visit

## 2014-10-20 DIAGNOSIS — Z1231 Encounter for screening mammogram for malignant neoplasm of breast: Secondary | ICD-10-CM

## 2014-10-22 ENCOUNTER — Other Ambulatory Visit: Payer: Self-pay | Admitting: Family Medicine

## 2014-10-22 DIAGNOSIS — R928 Other abnormal and inconclusive findings on diagnostic imaging of breast: Secondary | ICD-10-CM

## 2014-10-28 ENCOUNTER — Ambulatory Visit
Admission: RE | Admit: 2014-10-28 | Discharge: 2014-10-28 | Disposition: A | Discharge status: Home / Self Care | Payer: Managed Care, Other (non HMO) | Source: Ambulatory Visit | Attending: Family Medicine | Admitting: Family Medicine

## 2014-10-28 ENCOUNTER — Other Ambulatory Visit: Payer: Self-pay | Admitting: Family Medicine

## 2014-10-28 DIAGNOSIS — R928 Other abnormal and inconclusive findings on diagnostic imaging of breast: Secondary | ICD-10-CM

## 2014-12-30 ENCOUNTER — Other Ambulatory Visit: Payer: Managed Care, Other (non HMO) | Admitting: Family Medicine

## 2015-01-19 ENCOUNTER — Encounter: Payer: Self-pay | Admitting: Family Medicine

## 2015-01-19 ENCOUNTER — Ambulatory Visit (INDEPENDENT_AMBULATORY_CARE_PROVIDER_SITE_OTHER): Payer: Managed Care, Other (non HMO) | Admitting: Family Medicine

## 2015-01-19 VITALS — BP 138/86 | HR 104 | Temp 98.2°F | Ht 62.5 in | Wt 157.8 lb

## 2015-01-19 DIAGNOSIS — L989 Disorder of the skin and subcutaneous tissue, unspecified: Secondary | ICD-10-CM

## 2015-01-19 NOTE — Progress Notes (Signed)
Pre visit review using our clinic review tool, if applicable. No additional management support is needed unless otherwise documented below in the visit note. 

## 2015-01-19 NOTE — Patient Instructions (Signed)
-  please apply the antibiotic ointment gently to this lesion twice daily  -We placed a referral for you as discussed to the ear, nose and throat doctor for evaluation.

## 2015-01-19 NOTE — Progress Notes (Signed)
HPI:  Acute visit for:  Sore in the nose: -has had some bad allergies the last month -has had several nose bleeds per week always on the L side for about 1 month -she looked in her nose with a light and saw a little  that has not healed -denies: fevers, chills, sinus pain, pain in her nose -medications: allegra d, no nasal sprays right now -she has a hx of basal cell skin ca  ROS: See pertinent positives and negatives per HPI.  Past Medical History  Diagnosis Date  . Anxiety   . Asthma   . Fibrocystic breast changes 08/27/2014    No hx breast biopsy. No FH first degree relative with breast or ovarian ca. Getting yearly mammograms per prior PCP recs.   . Basal cell carcinoma of skin     Past Surgical History  Procedure Laterality Date  . Abdominal hysterectomy      for cervical dysplasia    Family History  Problem Relation Age of Onset  . Lung cancer Father     smoker and worked in [employer]  . Asthma Mother     History   Social History  . Marital Status: Divorced    Spouse Name: N/A  . Number of Children: N/A  . Years of Education: N/A   Social History Main Topics  . Smoking status: Never Smoker   . Smokeless tobacco: Never Used  . Alcohol Use: 0.0 oz/week    0 Standard drinks or equivalent per week     Comment: occ- 1-2 times per month  . Drug Use: No  . Sexual Activity:    Partners: Male    Birth Control/ Protection: Surgical     Comment: hysterectomy   Other Topics Concern  . None   Social History Narrative   Work or School: Scientist, clinical (histocompatibility and immunogenetics) [employer]      Home Situation: lives with husband and two older children      Spiritual Beliefs: Baptist      Lifestyle: exercise is sporadic; diet is ok           Current outpatient prescriptions:  .  albuterol (PROVENTIL HFA;VENTOLIN HFA) 108 (90 BASE) MCG/ACT inhaler, Inhale 2 puffs into the lungs 2 (two) times daily as needed., Disp: 3.7 g, Rfl: 6 .  fexofenadine (ALLEGRA) 180 MG tablet, Take  180 mg by mouth as needed for allergies or rhinitis., Disp: , Rfl:  .  fexofenadine-pseudoephedrine (ALLEGRA-D 24) 180-240 MG per 24 hr tablet, Take 1 tablet by mouth as needed., Disp: , Rfl:   EXAM:  Filed Vitals:   01/19/15 1500  BP: 138/86  Pulse: 104  Temp: 98.2 F (36.8 C)    Body mass index is 28.38 kg/(m^2).  GENERAL: vitals reviewed and listed above, alert, oriented, appears well hydrated and in no acute distress  HEENT: atraumatic, conjunttiva clear, no obvious abnormalities on inspection of external nose and ears, on inspection of nose she has an erythematous dome shaped papule on the ant L nasal septum with a small amount of dried blood approx 4-63mm in diameter  NECK: no obvious masses on inspection  MS: moves all extremities without noticeable abnormality  PSYCH: pleasant and cooperative, no obvious depression or anxiety  ASSESSMENT AND PLAN:  Discussed the following assessment and plan:  Non-healing skin lesion of nose - Plan: Ambulatory referral to ENT  -she is very worried about cancer given her hx, this may be a BCC versus midly infected benign lesion or viral lesion -  she opted for ENT eval, topical abx ointment in interim and avoidance of manipulation of nose -Patient advised to return or notify a doctor immediately if symptoms worsen or persist or new concerns arise.  Patient Instructions  -please apply the antibiotic ointment gently to this lesion twice daily  -We placed a referral for you as discussed to the ear, nose and throat doctor for evaluation.       Colin Benton R.

## 2015-02-03 ENCOUNTER — Other Ambulatory Visit: Payer: Self-pay | Admitting: Otolaryngology

## 2015-03-31 ENCOUNTER — Encounter: Payer: Self-pay | Admitting: Obstetrics & Gynecology

## 2015-10-13 ENCOUNTER — Encounter: Payer: Self-pay | Admitting: Obstetrics & Gynecology

## 2015-10-13 ENCOUNTER — Telehealth: Payer: Self-pay | Admitting: Obstetrics & Gynecology

## 2015-10-13 NOTE — Telephone Encounter (Signed)
Called number listed to notify of cancelled appointment. No answer and no voicemail set up to leave message.

## 2015-11-03 ENCOUNTER — Ambulatory Visit: Payer: Managed Care, Other (non HMO) | Admitting: Obstetrics & Gynecology

## 2015-11-09 ENCOUNTER — Encounter: Payer: Self-pay | Admitting: Obstetrics & Gynecology

## 2015-11-09 ENCOUNTER — Ambulatory Visit (INDEPENDENT_AMBULATORY_CARE_PROVIDER_SITE_OTHER): Payer: Managed Care, Other (non HMO) | Admitting: Obstetrics & Gynecology

## 2015-11-09 VITALS — BP 136/80 | HR 78 | Resp 16 | Ht 62.5 in | Wt 161.0 lb

## 2015-11-09 DIAGNOSIS — Z01419 Encounter for gynecological examination (general) (routine) without abnormal findings: Secondary | ICD-10-CM | POA: Diagnosis not present

## 2015-11-09 DIAGNOSIS — Z124 Encounter for screening for malignant neoplasm of cervix: Secondary | ICD-10-CM | POA: Diagnosis not present

## 2015-11-09 DIAGNOSIS — Z0000 Encounter for general adult medical examination without abnormal findings: Secondary | ICD-10-CM | POA: Diagnosis not present

## 2015-11-09 NOTE — Progress Notes (Signed)
40 y.o. G5P2 DivorcedCaucasianF here for annual exam.  Doing well.  Reports she's working on some weight loss.  She would like to be down another 10 pounds.  Denies vaginal.   No LMP recorded. Patient has had a hysterectomy.          Sexually active: Yes.    The current method of family planning is status post hysterectomy.    Exercising: Yes.    Walking, jogging Smoker:  no  Health Maintenance: Pap:  09/04/14 Neg History of abnormal Pap:  Yes, cervical dysplasia 2006  MMG:  10/28/14 BIRADS1:neg Colonoscopy:  Never BMD:   Never TDaP:  04/15/2011 Screening Labs: PCP, Urine today: PCP   reports that she has never smoked. She has never used smokeless tobacco. She reports that she drinks alcohol. She reports that she does not use illicit drugs.  Past Medical History  Diagnosis Date  . Anxiety   . Asthma   . Fibrocystic breast changes 08/27/2014    No hx breast biopsy. No FH first degree relative with breast or ovarian ca. Getting yearly mammograms per prior PCP recs.   . Basal cell carcinoma of skin     Past Surgical History  Procedure Laterality Date  . Abdominal hysterectomy      for cervical dysplasia    Current Outpatient Prescriptions  Medication Sig Dispense Refill  . albuterol (PROVENTIL HFA;VENTOLIN HFA) 108 (90 BASE) MCG/ACT inhaler Inhale 2 puffs into the lungs 2 (two) times daily as needed. 3.7 g 6  . fexofenadine (ALLEGRA) 180 MG tablet Take 180 mg by mouth as needed for allergies or rhinitis.     No current facility-administered medications for this visit.    Family History  Problem Relation Age of Onset  . Lung cancer Father     smoker and worked in [employer]  . Asthma Mother     ROS:  Pertinent items are noted in HPI.  Otherwise, a comprehensive ROS was negative.  Exam:   BP 136/80 mmHg  Pulse 78  Resp 16  Ht 5' 2.5" (1.588 m)  Wt 161 lb (73.029 kg)  BMI 28.96 kg/m2  Weight change: -5#   Height: 5' 2.5" (158.8 cm)  Ht Readings from Last 3 Encounters:   11/09/15 5' 2.5" (1.588 m)  01/19/15 5' 2.5" (1.588 m)  09/04/14 5' 2.5" (1.588 m)    General appearance: alert, cooperative and appears stated age Head: Normocephalic, without obvious abnormality, atraumatic Neck: no adenopathy, supple, symmetrical, trachea midline and thyroid normal to inspection and palpation Lungs: clear to auscultation bilaterally Breasts: normal appearance, no masses or tenderness Heart: regular rate and rhythm Abdomen: soft, non-tender; bowel sounds normal; no masses,  no organomegaly Extremities: extremities normal, atraumatic, no cyanosis or edema Skin: Skin color, texture, turgor normal. No rashes or lesions Lymph nodes: Cervical, supraclavicular, and axillary nodes normal. No abnormal inguinal nodes palpated Neurologic: Grossly normal  Pelvic: External genitalia:  no lesions              Urethra:  normal appearing urethra with no masses, tenderness or lesions              Bartholins and Skenes: normal                 Vagina: normal appearing vagina with normal color and discharge, no lesions              Cervix: absent              Pap  taken: Yes.   Bimanual Exam:  Uterus:  uterus absent              Adnexa: no mass, fullness, tenderness               Rectovaginal: Confirms               Anus:  normal sphincter tone, no lesions  Chaperone was present for exam.  A:  Well Woman with normal exam H/O cervical dysplasia and hysterectomy 11/2004 with final pathology showing dysplasia grade 1/2/3 Left labial hemangioma Elevated lipids Allergies/asthma  P:   Mammogram guidelines discussed pap smear with HR HPV obtained today Pt will return for fasting labs.  Lipids, CMP, TSH, Vit D. return annually or prn

## 2015-11-09 NOTE — Addendum Note (Signed)
Addended by: Charmayne Sheer on: 11/09/2015 05:05 PM   Modules accepted: Orders

## 2015-11-12 LAB — IPS PAP TEST WITH HPV

## 2015-11-30 ENCOUNTER — Other Ambulatory Visit: Payer: Managed Care, Other (non HMO)

## 2015-12-14 ENCOUNTER — Other Ambulatory Visit: Payer: Managed Care, Other (non HMO)

## 2015-12-21 ENCOUNTER — Other Ambulatory Visit (INDEPENDENT_AMBULATORY_CARE_PROVIDER_SITE_OTHER): Payer: BLUE CROSS/BLUE SHIELD

## 2015-12-21 DIAGNOSIS — Z0000 Encounter for general adult medical examination without abnormal findings: Secondary | ICD-10-CM

## 2015-12-21 LAB — LIPID PANEL
Cholesterol: 223 mg/dL — ABNORMAL HIGH (ref 125–200)
HDL: 83 mg/dL (ref >=46)
LDL Cholesterol: 127 mg/dL (ref <130)
Total CHOL/HDL Ratio: 2.7 Ratio (ref <=5.0)
Triglycerides: 67 mg/dL (ref <150)
VLDL: 13 mg/dL (ref <30)

## 2015-12-21 LAB — COMPREHENSIVE METABOLIC PANEL
ALT: 15 U/L (ref 6–29)
AST: 18 U/L (ref 10–30)
Albumin: 4.2 g/dL (ref 3.6–5.1)
Alkaline Phosphatase: 34 U/L (ref 33–115)
BUN: 13 mg/dL (ref 7–25)
CO2: 27 mmol/L (ref 20–31)
Calcium: 9.4 mg/dL (ref 8.6–10.2)
Chloride: 106 mmol/L (ref 98–110)
Creat: 0.85 mg/dL (ref 0.50–1.10)
Glucose, Bld: 89 mg/dL (ref 65–99)
Potassium: 4.6 mmol/L (ref 3.5–5.3)
Sodium: 139 mmol/L (ref 135–146)
Total Bilirubin: 0.5 mg/dL (ref 0.2–1.2)
Total Protein: 6.9 g/dL (ref 6.1–8.1)

## 2015-12-21 LAB — TSH: TSH: 0.55 mIU/L

## 2015-12-22 ENCOUNTER — Other Ambulatory Visit: Payer: Self-pay | Admitting: Obstetrics and Gynecology

## 2015-12-22 DIAGNOSIS — R7989 Other specified abnormal findings of blood chemistry: Secondary | ICD-10-CM

## 2015-12-22 LAB — VITAMIN D 25 HYDROXY (VIT D DEFICIENCY, FRACTURES): Vit D, 25-Hydroxy: 24 ng/mL — ABNORMAL LOW (ref 30–100)

## 2015-12-22 MED ORDER — VITAMIN D (ERGOCALCIFEROL) 1.25 MG (50000 UNIT) PO CAPS: 50000.0000 [IU] | ORAL_CAPSULE | ORAL | Status: DC

## 2016-05-09 ENCOUNTER — Telehealth: Payer: Self-pay | Admitting: *Deleted

## 2016-05-09 NOTE — Telephone Encounter (Signed)
Call to patient. Patient agreeable to schedule lab recheck. Patient states she would need to schedule for 3 weeks out due to scheduling conflicts. Lab visit scheduled for Monday 06/06/16 at 1400. Patient agreeable to date and time.   Routing to provider for final review. Patient agreeable to disposition. Will close encounter.    cc Dr. Sabra Heck

## 2016-05-09 NOTE — Telephone Encounter (Signed)
-----   Message from Nunzio Cobbs, MD sent at 05/07/2016 12:43 PM EDT ----- Regarding: Please call and remind patient she is due for a vit D recheck Hi Raquel Sarna,   This patient came up in my reminder box that she is due for a vitamin D recheck.  I assisted while Dr. Sabra Heck was out of the office and reviewed a lab and prescribed vit D.  She is now due for a lab visit to recheck this.   Please call her and schedule this lab visit.   Thanks,   Brook  ----- Message ----- From: SYSTEM Sent: 04/27/2016  12:06 AM To: Nunzio Cobbs, MD

## 2016-06-06 ENCOUNTER — Telehealth: Payer: Self-pay | Admitting: Obstetrics & Gynecology

## 2016-06-06 ENCOUNTER — Other Ambulatory Visit: Payer: Self-pay

## 2016-06-06 NOTE — Telephone Encounter (Signed)
Patient canceled her vitamin D recheck appointment this afternoon. Patient said she would call later to reschedule.

## 2016-11-29 ENCOUNTER — Other Ambulatory Visit (HOSPITAL_COMMUNITY)
Admission: RE | Admit: 2016-11-29 | Discharge: 2016-11-29 | Disposition: A | Discharge status: Home / Self Care | Payer: BLUE CROSS/BLUE SHIELD | Source: Ambulatory Visit | Attending: Obstetrics & Gynecology | Admitting: Obstetrics & Gynecology

## 2016-11-29 ENCOUNTER — Other Ambulatory Visit: Payer: Self-pay | Admitting: Obstetrics & Gynecology

## 2016-11-29 ENCOUNTER — Encounter: Payer: Self-pay | Admitting: Obstetrics & Gynecology

## 2016-11-29 ENCOUNTER — Ambulatory Visit (INDEPENDENT_AMBULATORY_CARE_PROVIDER_SITE_OTHER): Payer: BLUE CROSS/BLUE SHIELD | Admitting: Obstetrics & Gynecology

## 2016-11-29 VITALS — BP 122/80 | HR 76 | Resp 20 | Ht 62.75 in | Wt 168.2 lb

## 2016-11-29 DIAGNOSIS — Z124 Encounter for screening for malignant neoplasm of cervix: Secondary | ICD-10-CM | POA: Diagnosis not present

## 2016-11-29 DIAGNOSIS — Z0000 Encounter for general adult medical examination without abnormal findings: Secondary | ICD-10-CM | POA: Diagnosis not present

## 2016-11-29 DIAGNOSIS — Z01419 Encounter for gynecological examination (general) (routine) without abnormal findings: Secondary | ICD-10-CM | POA: Diagnosis not present

## 2016-11-29 DIAGNOSIS — R232 Flushing: Secondary | ICD-10-CM

## 2016-11-29 LAB — COMPREHENSIVE METABOLIC PANEL
ALT: 12 U/L (ref 6–29)
AST: 16 U/L (ref 10–30)
Albumin: 4.4 g/dL (ref 3.6–5.1)
Alkaline Phosphatase: 35 U/L (ref 33–115)
BUN: 11 mg/dL (ref 7–25)
CO2: 23 mmol/L (ref 20–31)
Calcium: 9.5 mg/dL (ref 8.6–10.2)
Chloride: 104 mmol/L (ref 98–110)
Creat: 0.8 mg/dL (ref 0.50–1.10)
Glucose, Bld: 78 mg/dL (ref 65–99)
Potassium: 3.8 mmol/L (ref 3.5–5.3)
Sodium: 139 mmol/L (ref 135–146)
Total Bilirubin: 0.7 mg/dL (ref 0.2–1.2)
Total Protein: 7.4 g/dL (ref 6.1–8.1)

## 2016-11-29 LAB — TSH: TSH: 0.34 mIU/L — ABNORMAL LOW

## 2016-11-29 NOTE — Progress Notes (Signed)
41 y.o. G47P2 Divorced Caucasian F here for annual exam.  Repots she's had two months of night sweats.  This wakes her up and causes her some sleep issues.  Also reports she is having "more awareness" of her bladder being full.  Denies pain.  Urine color is normal.  Denies vaginal bleeding.  Pt states this does not feel like an infection.  Also reports mild leaks with sneezing, coughing.   No LMP recorded. Patient has had a hysterectomy.          Sexually active: Yes.    The current method of family planning is status post hysterectomy.    Exercising: Yes.    Walking, Treadmill Smoker:  no  Health Maintenance: Pap:  11/09/15 negative, HR HPV negative, 09/04/14 negative, 08/27/13 negative  History of abnormal Pap:  yes MMG:  10/28/14 BIRADS 1 negative  Colonoscopy:  Never BMD:   No TDaP:  04/15/11  Hep C testing: not indicated  Screening Labs: done    reports that she has never smoked. She has never used smokeless tobacco. She reports that she drinks alcohol. She reports that she does not use drugs.  Past Medical History:  Diagnosis Date  . Anxiety   . Asthma   . Basal cell carcinoma of skin    Dr. Derrel Nip  . Fibrocystic breast changes 08/27/2014   No hx breast biopsy. No FH first degree relative with breast or ovarian ca. Getting yearly mammograms per prior PCP recs.     Past Surgical History:  Procedure Laterality Date  . LAPAROSCOPIC HYSTERECTOMY  2007   for cervical dysplasia    Current Outpatient Prescriptions  Medication Sig Dispense Refill  . albuterol (PROVENTIL HFA;VENTOLIN HFA) 108 (90 BASE) MCG/ACT inhaler Inhale 2 puffs into the lungs 2 (two) times daily as needed. 3.7 g 6  . Vitamin D, Ergocalciferol, (DRISDOL) 50000 units CAPS capsule Take 1 capsule (50,000 Units total) by mouth every 14 (fourteen) days. 6 capsule 0   No current facility-administered medications for this visit.     Family History  Problem Relation Age of Onset  . Lung cancer Father     smoker  and worked in [employer]  . Asthma Mother     ROS:  Pertinent items are noted in HPI.  Otherwise, a comprehensive ROS was negative.  Exam:   BP 122/80 (BP Location: Right Arm, Patient Position: Sitting, Cuff Size: Normal)   Pulse 76   Resp 20   Ht 5' 2.75" (1.594 m)   Wt 168 lb 3.2 oz (76.3 kg)   BMI 30.03 kg/m   Weight change: +7#  Height: 5' 2.75" (159.4 cm)  Ht Readings from Last 3 Encounters:  11/29/16 5' 2.75" (1.594 m)  11/09/15 5' 2.5" (1.588 m)  01/19/15 5' 2.5" (1.588 m)    General appearance: alert, cooperative and appears stated age Head: Normocephalic, without obvious abnormality, atraumatic Neck: no adenopathy, supple, symmetrical, trachea midline and thyroid normal to inspection and palpation Lungs: clear to auscultation bilaterally Breasts: normal appearance, no masses or tenderness Heart: regular rate and rhythm Abdomen: soft, non-tender; bowel sounds normal; no masses,  no organomegaly Extremities: extremities normal, atraumatic, no cyanosis or edema Skin: Skin color, texture, turgor normal. No rashes or lesions Lymph nodes: Cervical, supraclavicular, and axillary nodes normal. No abnormal inguinal nodes palpated Neurologic: Grossly normal   Pelvic: External genitalia:  no lesions              Urethra:  normal appearing urethra with no  masses, tenderness or lesions              Bartholins and Skenes: normal                 Vagina: normal appearing vagina with normal color and discharge, no lesions              Cervix: absent              Pap taken: Yes.   Bimanual Exam:  Uterus:  uterus absent              Adnexa: normal adnexa and no mass, fullness, tenderness               Rectovaginal: Confirms               Anus:  normal sphincter tone, no lesions  Chaperone was present for exam.  A:  Well Woman with normal exam Hot flashes H/O LAVH 4/06 with final pathology showing dysplasia grades 1,2, and 3 Elevated lipids Left labial  hemangioma Allergies  P:   Mammogram guidelines reviewed pap smear obtained today FSH and estradiol level Vit D, CMP, and TSH will be obtained today as well return annually or prn

## 2016-11-30 ENCOUNTER — Other Ambulatory Visit: Payer: Self-pay | Admitting: Obstetrics & Gynecology

## 2016-11-30 ENCOUNTER — Telehealth: Payer: Self-pay

## 2016-11-30 DIAGNOSIS — R7989 Other specified abnormal findings of blood chemistry: Secondary | ICD-10-CM

## 2016-11-30 LAB — T4, FREE: Free T4: 1.1 ng/dL (ref 0.8–1.8)

## 2016-11-30 LAB — VITAMIN D 25 HYDROXY (VIT D DEFICIENCY, FRACTURES): Vit D, 25-Hydroxy: 21 ng/mL — ABNORMAL LOW (ref 30–100)

## 2016-11-30 LAB — FOLLICLE STIMULATING HORMONE: FSH: 4.8 m[IU]/mL

## 2016-11-30 LAB — ESTRADIOL: Estradiol: 108 pg/mL

## 2016-11-30 MED ORDER — VITAMIN D (ERGOCALCIFEROL) 1.25 MG (50000 UNIT) PO CAPS: 50000.0000 [IU] | ORAL_CAPSULE | ORAL | 0 refills | Status: DC

## 2016-11-30 NOTE — Telephone Encounter (Signed)
Spoke with patient. Advised of results and message as seen below from [place]. Patient verbalizes understanding. 3 month follow up lab appointment scheduled for 03/06/2017 at 2:30 pm. Patient is agreeable to date and time. Aware she will be contacted once additional thyroid testing returns.   Routing to provider for final review. Patient agreeable to disposition. Will close encounter.

## 2016-11-30 NOTE — Telephone Encounter (Signed)
-----   Message from Megan Salon, MD sent at 11/30/2016  6:20 AM EDT ----- Please let pt know her [place] was normal and estradiol level was normal.  She is not menopausal.  Her metabolic panel was normal.  Her Vit D was low at 21.  She has been taking Vit D but not regularly.  She needs to restart at 50,000 IU weekly and recheck level in 12 weeks.  I did change her prescription to weekly dosing and an order for follow up lab work has been placed.    Also, her TSH was low like she may be producing a little too much thyroid.  I'm adding additional thyroid testing to her blood work done yesterday to see if she has hyperthyroidism.

## 2016-12-01 LAB — CYTOLOGY - PAP: Diagnosis: NEGATIVE

## 2016-12-04 LAB — T3: T3, Total: 96 ng/dL (ref 76–181)

## 2016-12-06 ENCOUNTER — Other Ambulatory Visit: Payer: Self-pay | Admitting: *Deleted

## 2016-12-06 DIAGNOSIS — R7989 Other specified abnormal findings of blood chemistry: Secondary | ICD-10-CM

## 2017-01-03 ENCOUNTER — Encounter: Payer: Self-pay | Admitting: Family Medicine

## 2017-01-03 ENCOUNTER — Ambulatory Visit (INDEPENDENT_AMBULATORY_CARE_PROVIDER_SITE_OTHER): Payer: BLUE CROSS/BLUE SHIELD | Admitting: Family Medicine

## 2017-01-03 VITALS — BP 122/88 | HR 100 | Temp 99.0°F | Ht 62.75 in | Wt 170.0 lb

## 2017-01-03 DIAGNOSIS — R946 Abnormal results of thyroid function studies: Secondary | ICD-10-CM

## 2017-01-03 DIAGNOSIS — F458 Other somatoform disorders: Secondary | ICD-10-CM | POA: Diagnosis not present

## 2017-01-03 DIAGNOSIS — K219 Gastro-esophageal reflux disease without esophagitis: Secondary | ICD-10-CM | POA: Diagnosis not present

## 2017-01-03 DIAGNOSIS — J302 Other seasonal allergic rhinitis: Secondary | ICD-10-CM

## 2017-01-03 DIAGNOSIS — R0989 Other specified symptoms and signs involving the circulatory and respiratory systems: Secondary | ICD-10-CM

## 2017-01-03 DIAGNOSIS — R198 Other specified symptoms and signs involving the digestive system and abdomen: Secondary | ICD-10-CM

## 2017-01-03 MED ORDER — ESOMEPRAZOLE MAGNESIUM 20 MG PO CPDR: 20.0000 mg | DELAYED_RELEASE_CAPSULE | Freq: Every day | ORAL | 0 refills | Status: DC

## 2017-01-03 NOTE — Patient Instructions (Addendum)
BEFORE YOU LEAVE: -follow up: 1 month -labs  -We placed a referral for you as discussed for the ultrasound of then neck. It usually takes about 1-2 weeks to process and schedule this referral. If you have not heard from [place] regarding this appointment in 2 weeks please contact our office.  -start Nexium 20mg  once daily  -continue the allergy medications  I hope you are feeling better soon! Seek care immediately if worsening, new concerns or you are not improving with treatment.    Food Choices for Gastroesophageal Reflux Disease, Adult When you have gastroesophageal reflux disease (GERD), the foods you eat and your eating habits are very important. Choosing the right foods can help ease your discomfort. What guidelines do I need to follow?  Choose fruits, vegetables, whole grains, and low-fat dairy products.  Choose low-fat meat, fish, and poultry.  Limit fats such as oils, salad dressings, butter, nuts, and avocado.  Keep a food diary. This helps you identify foods that cause symptoms.  Avoid foods that cause symptoms. These may be different for everyone.  Eat small meals often instead of 3 large meals a day.  Eat your meals slowly, in a place where you are relaxed.  Limit fried foods.  Cook foods using methods other than frying.  Avoid drinking alcohol.  Avoid drinking large amounts of liquids with your meals.  Avoid bending over or lying down until 2-3 hours after eating. What foods are not recommended? These are some foods and drinks that may make your symptoms worse: Vegetables  Tomatoes. Tomato juice. Tomato and spaghetti sauce. Chili peppers. Onion and garlic. Horseradish. Fruits  Oranges, grapefruit, and lemon (fruit and juice). Meats  High-fat meats, fish, and poultry. This includes hot dogs, ribs, ham, sausage, salami, and bacon. Dairy  Whole milk and chocolate milk. Sour cream. Cream. Butter. Ice cream. Cream cheese. Drinks  Coffee and tea. Bubbly  (carbonated) drinks or energy drinks. Condiments  Hot sauce. Barbecue sauce. Sweets/Desserts  Chocolate and cocoa. Donuts. Peppermint and spearmint. Fats and Oils  High-fat foods. This includes [place] fries and potato chips. Other  Vinegar. Strong spices. This includes black pepper, white pepper, red pepper, cayenne, curry powder, cloves, ginger, and chili powder. The items listed above may not be a complete list of foods and drinks to avoid. Contact your dietitian for more information.  This information is not intended to replace advice given to you by your health care provider. Make sure you discuss any questions you have with your health care provider. Document Released: 02/14/2012 Document Revised: 01/21/2016 Document Reviewed: 06/19/2013 Elsevier Interactive Patient Education  2017 [employer].

## 2017-01-03 NOTE — Progress Notes (Signed)
Pre visit review using our clinic review tool, if applicable. No additional management support is needed unless otherwise documented below in the visit note. 

## 2017-01-03 NOTE — Progress Notes (Signed)
HPI:  Alexandra Welch is a very pleasant 41 year old with a past medical history significant for anxiety and asthma here for an acute visit for several concerns. First of all, she has had a globus sensation for a few weeks. She has this most the time, but it does worsen at night. She has felt this in the past, and thought that it was from her anxiety. Has become much more frequent. Denies dysphagia, choking sensation, difficulty breathing.  She does have acid reflux. She has frequent heartburn, reflux of material into the mouth and a burning sensation in the throat. This has improved some with diet modification, but she still has symptoms more than 2 or 3 times per week.  She also has a history of seasonal allergies and has started Claritin and Flonase for this. These symptoms have improved some, but she still does have some nasal congestion and sneezing at times.  She had a visit with her gynecologist recently and was told that her thyroid level was slightly off and a recheck is planned in a week. She then felt that she noticed that the thyroid felt full. Denies changes in weight, sore throat, fevers, malaise, change in bowels.  With all of this going on, she has been reading about the globus sensation on the Internet and is very afraid that she may have thyroid cancer. This is caused her great deal of anxiety, she feels this is why her heart rate and blood pressure elevated today.  ROS: See pertinent positives and negatives per HPI.  Past Medical History:  Diagnosis Date  . Anxiety   . Asthma   . Basal cell carcinoma of skin    Dr. Derrel Nip  . Fibrocystic breast changes 08/27/2014   No hx breast biopsy. No FH first degree relative with breast or ovarian ca. Getting yearly mammograms per prior PCP recs.     Past Surgical History:  Procedure Laterality Date  . LAPAROSCOPIC HYSTERECTOMY  2007   for cervical dysplasia    Family History  Problem Relation Age of Onset  . Lung cancer  Father     smoker and worked in [employer]  . Asthma Mother     Social History   Social History  . Marital status: Divorced    Spouse name: N/A  . Number of children: N/A  . Years of education: N/A   Social History Main Topics  . Smoking status: Never Smoker  . Smokeless tobacco: Never Used  . Alcohol use 0.0 oz/week     Comment: occ- 1-2 times per month  . Drug use: No  . Sexual activity: Yes    Partners: Male    Birth control/ protection: Surgical     Comment: hysterectomy   Other Topics Concern  . 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  albuterol (PROVENTIL HFA;VENTOLIN HFA) 108 (90 BASE) MCG/ACT inhaler, Inhale 2 puffs into the lungs 2 (two) times daily as needed., Disp: 3.7 g, Rfl: 6 .  fluticasone (FLONASE) 50 MCG/ACT nasal spray, Place into both nostrils daily., Disp: , Rfl:  .  loratadine (CLARITIN) 10 MG tablet, Take 10 mg by mouth daily., Disp: , Rfl:  .  Vitamin D, Ergocalciferol, (DRISDOL) 50000 units CAPS capsule, Take 1 capsule (50,000 Units total) by mouth every 7 (seven) days., Disp: 12 capsule, Rfl: 0 .  esomeprazole (NEXIUM) 20 MG capsule, Take 1 capsule (20 mg total) by mouth daily at 12 noon., Disp: 30 capsule, Rfl: 0  EXAM:  Vitals:   01/03/17 1622  BP: 122/88  Pulse: 100  Temp: 99 F (37.2 C)    Body mass index is 30.35 kg/m.  GENERAL: vitals reviewed and listed above, alert, oriented, appears well hydrated and in no acute distress  HEENT: atraumatic, conjunttiva clear, no obvious abnormalities on inspection of external nose and ears  NECK: questionable fullness L thyroid  LUNGS: clear to auscultation bilaterally, no wheezes, rales or rhonchi, good air movement  CV: HRRR, no peripheral edema  MS:  moves all extremities without noticeable abnormality  PSYCH: pleasant and cooperative, anxious  ASSESSMENT AND PLAN:  Discussed the following assessment and plan:  Globus sensation - Plan: US Soft Tissue Head/Neck  Gastroesophageal reflux disease, esophagitis presence not specified  Seasonal allergic rhinitis, unspecified trigger  Borderline abnormal TFTs - Plan: TSH, T3, free, T4, free  -we discussed possible serious and likely etiologies, workup and treatment, treatment risks and return precautions -after this discussion, Alexandra Welch opted for US neck, labs per orders, start nexium short course for GERD - may change to zantac once symptoms controlled, close follow up -follow up advised in 3-4 weeks -of course, we advised Tamya  to return or notify a doctor immediately if symptoms worsen or persist or new concerns arise.  Patient Instructions  BEFORE YOU LEAVE: -follow up: 1 month -labs  -We placed a referral for you as discussed for the ultrasound of then neck. It usually takes about 1-2 weeks to process and schedule this referral. If you have not heard from [place] regarding this appointment in 2 weeks please contact our office.  -start Nexium 20mg  once daily  -continue the allergy medications  I hope you are feeling better soon! Seek care immediately if worsening, new concerns or you are not improving with treatment.    Food Choices for Gastroesophageal Reflux Disease, Adult When you have gastroesophageal reflux disease (GERD), the foods you eat and your eating habits are very important. Choosing the right foods can help ease your discomfort. What guidelines do I need to follow?  Choose fruits, vegetables, whole grains, and low-fat dairy products.  Choose low-fat meat, fish, and poultry.  Limit fats such as oils, salad dressings, butter, nuts, and avocado.  Keep a food diary. This helps you identify foods that cause symptoms.  Avoid foods that cause symptoms. These may be  different for everyone.  Eat small meals often instead of 3 large meals a day.  Eat your meals slowly, in a place where you are relaxed.  Limit fried foods.  Cook foods using methods other than frying.  Avoid drinking alcohol.  Avoid drinking large amounts of liquids with your meals.  Avoid bending over or lying down until 2-3 hours after eating. What foods are not recommended? These are some foods and drinks that may make your symptoms worse: Vegetables  Tomatoes. Tomato juice. Tomato and spaghetti sauce. Chili peppers. Onion and garlic. Horseradish. Fruits  Oranges, grapefruit, and lemon (fruit and juice). Meats  High-fat meats, fish, and poultry. This includes hot dogs, ribs, ham, sausage, salami, and bacon. Dairy  Whole milk and chocolate milk. Sour cream. Cream. Butter. Ice cream. Cream cheese. Drinks  Coffee and tea. Bubbly (carbonated) drinks or  energy drinks. Condiments  Hot sauce. Barbecue sauce. Sweets/Desserts  Chocolate and cocoa. Donuts. Peppermint and spearmint. Fats and Oils  High-fat foods. This includes [place] fries and potato chips. Other  Vinegar. Strong spices. This includes black pepper, white pepper, red pepper, cayenne, curry powder, cloves, ginger, and chili powder. The items listed above may not be a complete list of foods and drinks to avoid. Contact your dietitian for more information.  This information is not intended to replace advice given to you by your health care provider. Make sure you discuss any questions you have with your health care provider. Document Released: 02/14/2012 Document Revised: 01/21/2016 Document Reviewed: 06/19/2013 Elsevier Interactive Patient Education  2017 [place]., DO

## 2017-01-04 ENCOUNTER — Other Ambulatory Visit: Payer: Self-pay | Admitting: Cardiology

## 2017-01-04 LAB — T4, FREE: Free T4: 0.89 ng/dL (ref 0.60–1.60)

## 2017-01-04 LAB — TSH: TSH: 0.91 u[IU]/mL (ref 0.35–4.50)

## 2017-01-04 LAB — T3, FREE: T3, Free: 3.1 pg/mL (ref 2.3–4.2)

## 2017-01-06 ENCOUNTER — Telehealth: Payer: Self-pay | Admitting: Obstetrics & Gynecology

## 2017-01-06 NOTE — Telephone Encounter (Signed)
Patient called and cancelled her lab appointment for recheck TSH because she had it tested at Dr. Julianne Rice office. The results will be in [facility] for reference.

## 2017-01-09 ENCOUNTER — Other Ambulatory Visit: Payer: Self-pay

## 2017-01-10 ENCOUNTER — Ambulatory Visit
Admission: RE | Admit: 2017-01-10 | Discharge: 2017-01-10 | Disposition: A | Discharge status: Home / Self Care | Payer: BLUE CROSS/BLUE SHIELD | Source: Ambulatory Visit | Attending: Family Medicine | Admitting: Family Medicine

## 2017-01-10 DIAGNOSIS — R0989 Other specified symptoms and signs involving the circulatory and respiratory systems: Secondary | ICD-10-CM

## 2017-01-10 DIAGNOSIS — R198 Other specified symptoms and signs involving the digestive system and abdomen: Secondary | ICD-10-CM

## 2017-02-02 ENCOUNTER — Encounter: Payer: Self-pay | Admitting: Family Medicine

## 2017-02-02 ENCOUNTER — Ambulatory Visit (INDEPENDENT_AMBULATORY_CARE_PROVIDER_SITE_OTHER): Payer: BLUE CROSS/BLUE SHIELD | Admitting: Family Medicine

## 2017-02-02 VITALS — BP 124/90 | HR 103 | Temp 98.4°F | Ht 62.75 in | Wt 165.3 lb

## 2017-02-02 DIAGNOSIS — K219 Gastro-esophageal reflux disease without esophagitis: Secondary | ICD-10-CM | POA: Diagnosis not present

## 2017-02-02 DIAGNOSIS — R030 Elevated blood-pressure reading, without diagnosis of hypertension: Secondary | ICD-10-CM | POA: Diagnosis not present

## 2017-02-02 DIAGNOSIS — F411 Generalized anxiety disorder: Secondary | ICD-10-CM

## 2017-02-02 MED ORDER — ESOMEPRAZOLE MAGNESIUM 20 MG PO CPDR: 20.0000 mg | DELAYED_RELEASE_CAPSULE | Freq: Every day | ORAL | 3 refills | Status: DC

## 2017-02-02 NOTE — Progress Notes (Signed)
HPI:  Follow up globus sensation in setting of GERD, PND from allergies and hx abnormal TFT. Seen last month and did thyroid labs (normal), [place] (unremarkable), nexium short course, allergy regimen. Reports is doing much better. The Nexium and changing diet really helped. Unfortunately she still has some symptoms 2-3 days per week. She thinks this and her elevated BP are due to stress. Husband with Gardener's and a large mass was found recently. He is out of work and has been quite sick with chemo treatments. This has been very hard on her. She does not want to take medications for anxiety - but has long hx anxiety worse recently. No panic, thoughts of harm to self or others. Agrees to consider CBT.  ROS: See pertinent positives and negatives per HPI.  Past Medical History:  Diagnosis Date  . Anxiety   . Asthma   . Basal cell carcinoma of skin    Dr. Derrel Nip  . Fibrocystic breast changes 08/27/2014   No hx breast biopsy. No FH first degree relative with breast or ovarian ca. Getting yearly mammograms per prior PCP recs.     Past Surgical History:  Procedure Laterality Date  . LAPAROSCOPIC HYSTERECTOMY  2007   for cervical dysplasia    Family History  Problem Relation Age of Onset  . Lung cancer Father        smoker and worked in [employer]  . Asthma Mother     Social History   Social History  . Marital status: Divorced    Spouse name: N/A  . Number of children: N/A  . Years of education: N/A   Social History Main Topics  . Smoking status: Never Smoker  . Smokeless tobacco: Never Used  . Alcohol use 0.0 oz/week     Comment: occ- 1-2 times per month  . Drug use: No  . Sexual activity: Yes    Partners: Male    Birth control/ protection: Surgical     Comment: hysterectomy   Other Topics Concern  . 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  albuterol (PROVENTIL HFA;VENTOLIN HFA) 108 (90 BASE) MCG/ACT inhaler, Inhale 2 puffs into the lungs 2 (two) times daily as needed., Disp: 3.7 g, Rfl: 6 .  fluticasone (FLONASE) 50 MCG/ACT nasal spray, Place into both nostrils daily., Disp: , Rfl:  .  loratadine (CLARITIN) 10 MG tablet, Take 10 mg by mouth daily., Disp: , Rfl:  .  Vitamin D, Ergocalciferol, (DRISDOL) 50000 units CAPS capsule, Take 1 capsule (50,000 Units total) by mouth every 7 (seven) days., Disp: 12 capsule, Rfl: 0 .  esomeprazole (NEXIUM) 20 MG capsule, Take 1 capsule (20 mg total) by mouth daily at 12 noon., Disp: 30 capsule, Rfl: 3  EXAM:  Vitals:   02/02/17 1632  BP: 124/90  Pulse: (!) 103  Temp: 98.4 F (36.9 C)    Body mass index is 29.52 kg/m.  GENERAL: vitals reviewed and listed above, alert, oriented, appears well hydrated and in no acute distress  HEENT: atraumatic, conjunttiva clear, no obvious abnormalities on inspection of external nose and ears  NECK: no obvious masses on inspection  LUNGS: clear to auscultation bilaterally, no  wheezes, rales or rhonchi, good air movement  CV: HRRR, no peripheral edema  MS: moves all extremities without noticeable abnormality  PSYCH: pleasant and cooperative, no obvious depression or anxiety  ASSESSMENT AND PLAN:  Discussed the following assessment and plan:  Gastroesophageal reflux disease, esophagitis presence not specified -diet handout -nexium 20 mg daily for now -follow up 2-3 months  Anxiety state -supported, counseled -offered tx -she agrees to consider CBT, prefers to avoid medications  Elevated blood pressure, situational -prefers not to take medications -follow up as planned  -Patient advised to return or notify a doctor immediately if symptoms worsen or persist or new concerns arise.  Patient Instructions  BEFORE YOU  LEAVE: -follow up: 2-3 months  Nexium 20mg  daily.  Consider counseling to help with the anxiety.  Sending prayers your way for your husband and you.   Food Choices for Gastroesophageal Reflux Disease, Adult When you have gastroesophageal reflux disease (GERD), the foods you eat and your eating habits are very important. Choosing the right foods can help ease your discomfort. What guidelines do I need to follow?  Choose fruits, vegetables, whole grains, and low-fat dairy products.  Choose low-fat meat, fish, and poultry.  Limit fats such as oils, salad dressings, butter, nuts, and avocado.  Keep a food diary. This helps you identify foods that cause symptoms.  Avoid foods that cause symptoms. These may be different for everyone.  Eat small meals often instead of 3 large meals a day.  Eat your meals slowly, in a place where you are relaxed.  Limit fried foods.  Cook foods using methods other than frying.  Avoid drinking alcohol.  Avoid drinking large amounts of liquids with your meals.  Avoid bending over or lying down until 2-3 hours after eating. What foods are not recommended? These are some foods and drinks that may make your symptoms worse: Vegetables Tomatoes. Tomato juice. Tomato and spaghetti sauce. Chili peppers. Onion and garlic. Horseradish. Fruits Oranges, grapefruit, and lemon (fruit and juice). Meats High-fat meats, fish, and poultry. This includes hot dogs, ribs, ham, sausage, salami, and bacon. Dairy Whole milk and chocolate milk. Sour cream. Cream. Butter. Ice cream. Cream cheese. Drinks Coffee and tea. Bubbly (carbonated) drinks or energy drinks. Condiments Hot sauce. Barbecue sauce. Sweets/Desserts Chocolate and cocoa. Donuts. Peppermint and spearmint. Fats and Oils High-fat foods. This includes [place] fries and potato chips. Other Vinegar. Strong spices. This includes black pepper, white pepper, red pepper, cayenne, curry powder, cloves,  ginger, and chili powder. The items listed above may not be a complete list of foods and drinks to avoid. Contact your dietitian for more information. This information is not intended to replace advice given to you by your health care provider. Make sure you discuss any questions you have with your health care provider. Document Released: 02/14/2012 Document Revised: 01/21/2016 Document Reviewed: 06/19/2013 Elsevier Interactive Patient Education  2017 [place]., DO

## 2017-02-02 NOTE — Patient Instructions (Signed)
BEFORE YOU LEAVE: -follow up: 2-3 months  Nexium 20mg  daily.  Consider counseling to help with the anxiety.  Sending prayers your way for your husband and you.   Food Choices for Gastroesophageal Reflux Disease, Adult When you have gastroesophageal reflux disease (GERD), the foods you eat and your eating habits are very important. Choosing the right foods can help ease your discomfort. What guidelines do I need to follow?  Choose fruits, vegetables, whole grains, and low-fat dairy products.  Choose low-fat meat, fish, and poultry.  Limit fats such as oils, salad dressings, butter, nuts, and avocado.  Keep a food diary. This helps you identify foods that cause symptoms.  Avoid foods that cause symptoms. These may be different for everyone.  Eat small meals often instead of 3 large meals a day.  Eat your meals slowly, in a place where you are relaxed.  Limit fried foods.  Cook foods using methods other than frying.  Avoid drinking alcohol.  Avoid drinking large amounts of liquids with your meals.  Avoid bending over or lying down until 2-3 hours after eating. What foods are not recommended? These are some foods and drinks that may make your symptoms worse: Vegetables Tomatoes. Tomato juice. Tomato and spaghetti sauce. Chili peppers. Onion and garlic. Horseradish. Fruits Oranges, grapefruit, and lemon (fruit and juice). Meats High-fat meats, fish, and poultry. This includes hot dogs, ribs, ham, sausage, salami, and bacon. Dairy Whole milk and chocolate milk. Sour cream. Cream. Butter. Ice cream. Cream cheese. Drinks Coffee and tea. Bubbly (carbonated) drinks or energy drinks. Condiments Hot sauce. Barbecue sauce. Sweets/Desserts Chocolate and cocoa. Donuts. Peppermint and spearmint. Fats and Oils High-fat foods. This includes [place] fries and potato chips. Other Vinegar. Strong spices. This includes black pepper, white pepper, red pepper, cayenne, curry powder,  cloves, ginger, and chili powder. The items listed above may not be a complete list of foods and drinks to avoid. Contact your dietitian for more information. This information is not intended to replace advice given to you by your health care provider. Make sure you discuss any questions you have with your health care provider. Document Released: 02/14/2012 Document Revised: 01/21/2016 Document Reviewed: 06/19/2013 Elsevier Interactive Patient Education  2017 [employer].

## 2017-02-10 ENCOUNTER — Telehealth: Payer: Self-pay | Admitting: Family Medicine

## 2017-02-10 MED ORDER — PANTOPRAZOLE SODIUM 20 MG PO TBEC
20.0000 mg | DELAYED_RELEASE_TABLET | Freq: Every day | ORAL | 3 refills | Status: DC
Start: 2017-02-10 — End: 2018-05-10

## 2017-02-10 NOTE — Telephone Encounter (Signed)
Received a fax from pharmacy.  Nexium is not a covered medication.  Prilosec and Protonix are covered medications.  Please choose a replacement.

## 2017-02-10 NOTE — Telephone Encounter (Signed)
Rx done. 

## 2017-02-10 NOTE — Telephone Encounter (Signed)
Please change to protonix. Thanks!

## 2017-02-21 ENCOUNTER — Ambulatory Visit: Payer: Managed Care, Other (non HMO) | Admitting: Obstetrics & Gynecology

## 2017-03-06 ENCOUNTER — Other Ambulatory Visit: Payer: BLUE CROSS/BLUE SHIELD

## 2017-03-13 ENCOUNTER — Other Ambulatory Visit: Payer: BLUE CROSS/BLUE SHIELD

## 2017-03-13 NOTE — Addendum Note (Signed)
Addended by: Graylon Good on: 03/13/2017 08:11 AM   Modules accepted: Orders

## 2017-04-10 ENCOUNTER — Telehealth: Payer: Self-pay | Admitting: *Deleted

## 2017-04-10 NOTE — Telephone Encounter (Signed)
-----   Message from Megan Salon, MD sent at 04/07/2017  9:14 AM EDT ----- Regarding: vit d recheck Can you please call this pt?  She has not come back for her Vit D recheck.  Order is active in [place].  Thanks.  Vinnie Level

## 2017-04-10 NOTE — Telephone Encounter (Signed)
Call to patient. Patient scheduled for Vitamin d recheck on Thursday 04/20/17 at 1500. Patient agreeable to date and time of appointment. Future order present for lab work.   Patient states she has some billing questions- routing to [place] as she was unavailable at time of phone call.

## 2017-04-20 ENCOUNTER — Other Ambulatory Visit: Payer: Self-pay

## 2017-04-25 ENCOUNTER — Encounter: Payer: Self-pay | Admitting: Family Medicine

## 2017-04-25 ENCOUNTER — Other Ambulatory Visit (INDEPENDENT_AMBULATORY_CARE_PROVIDER_SITE_OTHER): Payer: BLUE CROSS/BLUE SHIELD

## 2017-04-25 ENCOUNTER — Ambulatory Visit (INDEPENDENT_AMBULATORY_CARE_PROVIDER_SITE_OTHER): Payer: BLUE CROSS/BLUE SHIELD | Admitting: Family Medicine

## 2017-04-25 VITALS — BP 126/80 | HR 98 | Temp 98.5°F | Ht 62.75 in | Wt 167.3 lb

## 2017-04-25 DIAGNOSIS — R400 Somnolence: Secondary | ICD-10-CM

## 2017-04-25 DIAGNOSIS — H6983 Other specified disorders of Eustachian tube, bilateral: Secondary | ICD-10-CM

## 2017-04-25 DIAGNOSIS — Z72820 Sleep deprivation: Secondary | ICD-10-CM

## 2017-04-25 DIAGNOSIS — R0683 Snoring: Secondary | ICD-10-CM

## 2017-04-25 DIAGNOSIS — R0981 Nasal congestion: Secondary | ICD-10-CM | POA: Diagnosis not present

## 2017-04-25 DIAGNOSIS — R7989 Other specified abnormal findings of blood chemistry: Secondary | ICD-10-CM

## 2017-04-25 MED ORDER — DOXYCYCLINE HYCLATE 100 MG PO TABS: 100.0000 mg | ORAL_TABLET | Freq: Two times a day (BID) | ORAL | 0 refills | Status: DC

## 2017-04-25 NOTE — Patient Instructions (Signed)
BEFORE YOU LEAVE: -follow up: cancel follow up next week and schedule follow up in about 1 month instead  Claritin daily Add back flonase 2 sprays each nostril daily for 1 month Afrin nasal spray for 3 days then stop If not improving take the antibiotic as prescribed   -We placed a referral for you as discussed for evaluation for sleep apnea. It usually takes about 1-2 weeks to process and schedule this referral. If you have not heard from [place] regarding this appointment in 2 weeks please contact our office.  I hope you are feeling better soon! Seek care immediately if worsening, new concerns or you are not improving with treatment.   WE NOW OFFER   [facility] FAST TRACK!!!  SAME DAY Appointments for ACUTE CARE  Such as: Sprains, Injuries, cuts, abrasions, rashes, muscle pain, joint pain, back pain Colds, flu, sore throats, headache, allergies, cough, fever  Ear pain, sinus and eye infections Abdominal pain, nausea, vomiting, diarrhea, upset stomach Animal/insect bites  3 Easy Ways to Schedule: Walk-In Scheduling Call in scheduling Mychart Sign-up: [email or web address]

## 2017-04-25 NOTE — Progress Notes (Signed)
HPI:  Acute visit for sick visit: -worsening nasal congestion the last month since stopping flonase - she was worried flonase may be drying her out and causing sleep issues - see below -now with pain in L ear intermittently, sinus congestion, occ cough, frontal sinus pressure, feels off balance at times, subjective fever - but no temp when takes, nausea -no weakness, numbness, fever, SOB, wheezing, syncope, unusual headaches, CP -has constant anxiety, BP up on arrival - reports always up at PCP visit since teenager -hx allergies, mild int asthma - no asthma symptoms  Snoring/Poor sleep/? Apnea: -reports sleeps poorly -husband told her she snores and wakes up choking at night -feels unrested when she wakes up with chronic daytime somnolence -has had large tonsils her whole life  ROS: See pertinent positives and negatives per HPI.  Past Medical History:  Diagnosis Date  . Anxiety   . Asthma   . Basal cell carcinoma of skin    Dr. Derrel Nip  . Fibrocystic breast changes 08/27/2014   No hx breast biopsy. No FH first degree relative with breast or ovarian ca. Getting yearly mammograms per prior PCP recs.     Past Surgical History:  Procedure Laterality Date  . LAPAROSCOPIC HYSTERECTOMY  2007   for cervical dysplasia    Family History  Problem Relation Age of Onset  . Lung cancer Father        smoker and worked in [employer]  . Asthma Mother     Social History   Social History  . Marital status: Married    Spouse name: N/A  . Number of children: N/A  . Years of education: N/A   Social History Main Topics  . Smoking status: Never Smoker  . Smokeless tobacco: Never Used  . Alcohol use 0.0 oz/week     Comment: occ- 1-2 times per month  . Drug use: No  . Sexual activity: Yes    Partners: Male    Birth control/ protection: Surgical     Comment: hysterectomy   Other Topics Concern  . None   Social History Narrative   Work or School: Scientist, clinical (histocompatibility and immunogenetics) [employer]      Home Situation: lives with husband and two older children      Spiritual Beliefs: Baptist      Lifestyle: exercise is sporadic; diet is ok           Current Outpatient Prescriptions:  .  albuterol (PROVENTIL HFA;VENTOLIN HFA) 108 (90 BASE) MCG/ACT inhaler, Inhale 2 puffs into the lungs 2 (two) times daily as needed., Disp: 3.7 g, Rfl: 6 .  fluticasone (FLONASE) 50 MCG/ACT nasal spray, Place into both nostrils daily., Disp: , Rfl:  .  loratadine (CLARITIN) 10 MG tablet, Take 10 mg by mouth daily., Disp: , Rfl:  .  pantoprazole (PROTONIX) 20 MG tablet, Take 1 tablet (20 mg total) by mouth daily., Disp: 30 tablet, Rfl: 3 .  Vitamin D, Ergocalciferol, (DRISDOL) 50000 units CAPS capsule, Take 1 capsule (50,000 Units total) by mouth every 7 (seven) days., Disp: 12 capsule, Rfl: 0 .  doxycycline (VIBRA-TABS) 100 MG tablet, Take 1 tablet (100 mg total) by mouth 2 (two) times daily., Disp: 14 tablet, Rfl: 0  EXAM:  Vitals:   04/25/17 1548  BP: 126/80  Pulse: 98  Temp: 98.5 F (36.9 C)  SpO2: 98%    Body mass index is 29.87 kg/m.  GENERAL: vitals reviewed and listed above, alert, oriented, appears well hydrated and in no acute distress  HEENT: atraumatic, conjunttiva clear, no obvious abnormalities on inspection of external nose and ears, normal appearance of ear canals and TMs except for bilateral clear effusions, clear nasal congestion, mild post oropharyngeal erythema with PND, large tonsils without  exudate, no sinus TTP  NECK: no bruit, stable thyroid enlargement  LUNGS: clear to auscultation bilaterally, no wheezes, rales or rhonchi, good air movement  CV: HRRR, no peripheral edema  MS: moves all extremities without noticeable abnormality  PSYCH/NEURO: pleasant and cooperative, no obvious depression, anxious demeanor at baseline, speech and thought processing grossly intact, finger to nose normal, CN II-XII grossly intact, dix hallpike negative  ASSESSMENT AND  PLAN:  Discussed the following assessment and plan:  Sinus congestion  Dysfunction of both eustachian tubes  Snoring - Plan: Ambulatory referral to Pulmonology  Daytime somnolence - Plan: Ambulatory referral to Pulmonology  Poor sleep - Plan: Ambulatory referral to Pulmonology   -we discussed possible serious and likely etiologies, workup and treatment, treatment risks and return precautions -acute symptoms most likely allergy related, VURI or Sinusitis and opted to tx ETD with short course nasal decongestant, restart flonase and tx with doxy if not improving. -referral to pulm for eval for sleep apnea -bp improved on recheck, - f/u in 1 month about this and anxiety - plan flu shot then -Patient advised to return or notify a doctor immediately if symptoms worsen or persist or new concerns arise.  Patient Instructions  BEFORE YOU LEAVE: -follow up: cancel follow up next week and schedule follow up in about 1 month instead  Claritin daily Add back flonase 2 sprays each nostril daily for 1 month Afrin nasal spray for 3 days then stop If not improving take the antibiotic as prescribed   -We placed a referral for you as discussed for evaluation for sleep apnea. It usually takes about 1-2 weeks to process and schedule this referral. If you have not heard from [place] regarding this appointment in 2 weeks please contact our office.  I hope you are feeling better soon! Seek care immediately if worsening, new concerns or you are not improving with treatment.   WE NOW OFFER   [facility] FAST TRACK!!!  SAME DAY Appointments for ACUTE CARE  Such as: Sprains, Injuries, cuts, abrasions, rashes, muscle pain, joint pain, back pain Colds, flu, sore throats, headache, allergies, cough, fever  Ear pain, sinus and eye infections Abdominal pain, nausea, vomiting, diarrhea, upset stomach Animal/insect bites  3 Easy Ways to Schedule: Walk-In Scheduling Call in scheduling Mychart  Sign-up: [email or web address]           Colin Benton R., DO

## 2017-04-26 LAB — VITAMIN D 25 HYDROXY (VIT D DEFICIENCY, FRACTURES): Vit D, 25-Hydroxy: 33.9 ng/mL (ref 30.0–100.0)

## 2017-05-04 ENCOUNTER — Ambulatory Visit: Payer: BLUE CROSS/BLUE SHIELD | Admitting: Family Medicine

## 2017-05-18 ENCOUNTER — Encounter: Payer: Self-pay | Admitting: Family Medicine

## 2017-06-01 ENCOUNTER — Encounter: Payer: Self-pay | Admitting: Family Medicine

## 2017-06-01 ENCOUNTER — Ambulatory Visit (INDEPENDENT_AMBULATORY_CARE_PROVIDER_SITE_OTHER): Payer: BLUE CROSS/BLUE SHIELD | Admitting: Family Medicine

## 2017-06-01 VITALS — BP 128/82 | HR 98 | Temp 98.3°F | Ht 62.75 in | Wt 167.3 lb

## 2017-06-01 DIAGNOSIS — Z23 Encounter for immunization: Secondary | ICD-10-CM | POA: Diagnosis not present

## 2017-06-01 DIAGNOSIS — R079 Chest pain, unspecified: Secondary | ICD-10-CM | POA: Diagnosis not present

## 2017-06-01 DIAGNOSIS — J4520 Mild intermittent asthma, uncomplicated: Secondary | ICD-10-CM | POA: Diagnosis not present

## 2017-06-01 DIAGNOSIS — F411 Generalized anxiety disorder: Secondary | ICD-10-CM

## 2017-06-01 DIAGNOSIS — G4733 Obstructive sleep apnea (adult) (pediatric): Secondary | ICD-10-CM | POA: Diagnosis not present

## 2017-06-01 MED ORDER — ALBUTEROL SULFATE HFA 108 (90 BASE) MCG/ACT IN AERS
2.0000 | INHALATION_SPRAY | Freq: Two times a day (BID) | RESPIRATORY_TRACT | 6 refills | Status: DC | PRN
Start: 2017-06-01 — End: 2020-06-05

## 2017-06-01 NOTE — Patient Instructions (Addendum)
BEFORE YOU LEAVE: -check on referral for ? Sleep apnea -flu shot -xray sheet -follow up: 3-4 months  -restart protonix and take daily for 2 weeks  -try albuterol to see if this helps when you have chest tightness  -consider Cognitive Behavioral Therapy for anxiety and stress  -get the chest xray  -call us if chest pain persists in 2 weeks  -We placed a referral for you as discussed for the heart test. It usually takes about 1-2 weeks to process and schedule this referral. If you have not heard from [place] regarding this appointment in 2 weeks please contact our office.   WE NOW OFFER   [facility] FAST TRACK!!!  SAME DAY Appointments for ACUTE CARE  Such as: Sprains, Injuries, cuts, abrasions, rashes, muscle pain, joint pain, back pain Colds, flu, sore throats, headache, allergies, cough, fever  Ear pain, sinus and eye infections Abdominal pain, nausea, vomiting, diarrhea, upset stomach Animal/insect bites  3 Easy Ways to Schedule: Walk-In Scheduling Call in scheduling Mychart Sign-up: [email or web address]

## 2017-06-01 NOTE — Progress Notes (Signed)
HPI:  Here for follow-up anxiety, elevated blood pressure and flu shot. Reports she is doing okay. Her sinus and dizziness symptoms resolved almost entirely with the Flonase and Claritin. Reports she feels much better. She reports nobody ever contacted her about an evaluation for the sleep apnea. I can see that this referral was placed. She continues to have a significant amount of anxiety, but does not want to treat this with medication. She is interested in possibly getting some therapy. She has some chest tightness from time to time. This occurs at rest usually when she is having anxiety. It is a mild tightness in the upper mid chest, not associated with chest pain, shortness of breath or jaw pain. She does not have these symptoms when she is exercising. She does have a history of mild intermittent asthma. Has not tried her albuterol for these symptoms. She also has a  History of acid reflux and is not taking her Protonix.  ROS: See pertinent positives and negatives per HPI.  Past Medical History:  Diagnosis Date  . Anxiety   . Asthma   . Basal cell carcinoma of skin    Dr. Derrel Nip  . Fibrocystic breast changes 08/27/2014   No hx breast biopsy. No FH first degree relative with breast or ovarian ca. Getting yearly mammograms per prior PCP recs.     Past Surgical History:  Procedure Laterality Date  . LAPAROSCOPIC HYSTERECTOMY  2007   for cervical dysplasia    Family History  Problem Relation Age of Onset  . Lung cancer Father        smoker and worked in [employer]  . Asthma Mother     Social History   Social History  . Marital status: Married    Spouse name: N/A  . Number of children: N/A  . Years of education: N/A   Social History Main Topics  . Smoking status: Never Smoker  . Smokeless tobacco: Never Used  . Alcohol use 0.0 oz/week     Comment: occ- 1-2 times per month  . Drug use: No  . Sexual activity: Yes    Partners: Male    Birth control/ protection:  Surgical     Comment: hysterectomy   Other Topics Concern  . 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  albuterol (PROVENTIL HFA;VENTOLIN HFA) 108 (90 Base) MCG/ACT inhaler, Inhale 2 puffs into the lungs 2 (two) times daily as needed., Disp: 3.7 g, Rfl: 6 .  fluticasone (FLONASE) 50 MCG/ACT nasal spray, Place into both nostrils daily., Disp: , Rfl:  .  loratadine (CLARITIN) 10 MG tablet, Take 10 mg by mouth daily., Disp: , Rfl:  .  pantoprazole (PROTONIX) 20 MG tablet, Take 1 tablet (20 mg total) by mouth daily., Disp: 30 tablet, Rfl: 3 .  Vitamin D, Ergocalciferol, (DRISDOL) 50000 units CAPS capsule, Take 1 capsule (50,000 Units total) by mouth every 7 (seven) days., Disp: 12 capsule, Rfl: 0  EXAM:  Vitals:   06/01/17 1504  BP: 128/82  Pulse: 98  Temp: 98.3 F (36.8 C)    Body mass index is 29.87 kg/m.  GENERAL: vitals reviewed and listed above, alert, oriented, appears well  hydrated and in no acute distress  HEENT: atraumatic, conjunttiva clear, no obvious abnormalities on inspection of external nose and ears  NECK: no obvious masses on inspection  LUNGS: clear to auscultation bilaterally, no wheezes, rales or rhonchi, good air movement  CV: HRRR, no peripheral edema  MS: moves all extremities without noticeable abnormality  PSYCH: pleasant and cooperative, no obvious depression or anxiety  ASSESSMENT AND PLAN:  Discussed the following assessment and plan:  Anxiety state  Mild intermittent asthma without complication  Chest pain, unspecified type - Plan: DG Chest 2 View, Exercise Tolerance Test  OSA (obstructive sleep apnea)  -my assistant check on the status of the referral for sleep apnea evaluation -Flu shot -we  discussed possible serious and likely etiologies for the chest discomfort, workup and treatment, treatment risks and return precautions -after this discussion, Shamieka opted for consideration of cognitive behavioral therapy for generalized anxiety (information to schedule was provided), restarting her acid medicine for a few weeks, trial of albuterol when she has these symptoms, chest x-ray and exercise stress test. She agrees to call us in a few weeks and let us know how she is doing. -follow up advised in 3 weeks otherwise -of course, we advised Nara  to return or notify a doctor immediately if symptoms worsen or persist or new concerns arise.    Patient Instructions  BEFORE YOU LEAVE: -check on referral for ? Sleep apnea -flu shot -xray sheet -follow up: 3-4 months  -restart protonix and take daily for 2 weeks  -try albuterol to see if this helps when you have chest tightness  -consider Cognitive Behavioral Therapy for anxiety and stress  -get the chest xray  -call us if chest pain persists in 2 weeks  -We placed a referral for you as discussed for the heart test. It usually takes about 1-2 weeks to process and schedule this referral. If you have not heard from [place] regarding this appointment in 2 weeks please contact our office.   WE NOW OFFER   [facility] FAST TRACK!!!  SAME DAY Appointments for ACUTE CARE  Such as: Sprains, Injuries, cuts, abrasions, rashes, muscle pain, joint pain, back pain Colds, flu, sore throats, headache, allergies, cough, fever  Ear pain, sinus and eye infections Abdominal pain, nausea, vomiting, diarrhea, upset stomach Animal/insect bites  3 Easy Ways to Schedule: Walk-In Scheduling Call in scheduling Mychart Sign-up: [email or web address]           Colin Benton R., DO

## 2017-06-01 NOTE — Addendum Note (Signed)
Addended by: Agnes Lawrence on: 06/01/2017 03:36 PM   Modules accepted: Orders

## 2017-06-08 ENCOUNTER — Ambulatory Visit (INDEPENDENT_AMBULATORY_CARE_PROVIDER_SITE_OTHER): Payer: BLUE CROSS/BLUE SHIELD

## 2017-06-08 DIAGNOSIS — R079 Chest pain, unspecified: Secondary | ICD-10-CM

## 2017-06-08 LAB — EXERCISE TOLERANCE TEST
Estimated workload: 8.5 METS
Exercise duration (min): 7 min
Exercise duration (sec): 0 s
MPHR: 177 {beats}/min
Peak HR: 190 {beats}/min
Percent HR: 107 %
RPE: 15
Rest HR: 113 {beats}/min

## 2017-08-31 NOTE — Progress Notes (Deleted)
HPI:  Follow up GAD: -she opted to try CBT after her last visit -reports: -denies: ***  ROS: See pertinent positives and negatives per HPI.  Past Medical History:  Diagnosis Date  . Anxiety   . Asthma   . Basal cell carcinoma of skin    Dr. Derrel Nip  . Fibrocystic breast changes 08/27/2014   No hx breast biopsy. No FH first degree relative with breast or ovarian ca. Getting yearly mammograms per prior PCP recs.     Past Surgical History:  Procedure Laterality Date  . LAPAROSCOPIC HYSTERECTOMY  2007   for cervical dysplasia    Family History  Problem Relation Age of Onset  . Lung cancer Father        smoker and worked in [employer]  . Asthma Mother     Social History   Socioeconomic History  . Marital status: Married    Spouse name: Not on file  . Number of children: Not on file  . Years of education: Not on file  . Highest education level: Not on file  Social Needs  . Financial resource strain: Not on file  . Food insecurity - worry: Not on file  . Food insecurity - inability: Not on file  . Transportation needs - medical: Not on file  . Transportation needs - non-medical: Not on file  Occupational History  . Not on file  Tobacco Use  . Smoking status: Never Smoker  . Smokeless tobacco: Never Used  Substance and Sexual Activity  . Alcohol use: Yes    Alcohol/week: 0.0 oz    Comment: occ- 1-2 times per month  . Drug use: No  . Sexual activity: Yes    Partners: Male    Birth control/protection: Surgical    Comment: hysterectomy  Other Topics Concern  . 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  albuterol (PROVENTIL HFA;VENTOLIN HFA) 108 (90 Base) MCG/ACT inhaler, Inhale 2 puffs into the lungs 2 (two) times daily  as needed., Disp: 3.7 g, Rfl: 6 .  fluticasone (FLONASE) 50 MCG/ACT nasal spray, Place into both nostrils daily., Disp: , Rfl:  .  loratadine (CLARITIN) 10 MG tablet, Take 10 mg by mouth daily., Disp: , Rfl:  .  pantoprazole (PROTONIX) 20 MG tablet, Take 1 tablet (20 mg total) by mouth daily., Disp: 30 tablet, Rfl: 3 .  Vitamin D, Ergocalciferol, (DRISDOL) 50000 units CAPS capsule, Take 1 capsule (50,000 Units total) by mouth every 7 (seven) days., Disp: 12 capsule, Rfl: 0  EXAM:  There were no vitals filed for this visit.  There is no height or weight on file to calculate BMI.  GENERAL: vitals reviewed and listed above, alert, oriented, appears well hydrated and in no acute distress  HEENT: atraumatic, conjunttiva clear, no obvious abnormalities on inspection of external nose and ears  NECK: no obvious masses on inspection  LUNGS: clear to auscultation bilaterally, no wheezes, rales or rhonchi, good air movement  CV: HRRR, no peripheral edema  MS: moves all extremities without noticeable abnormality *** PSYCH: pleasant and cooperative, no obvious depression or anxiety  ASSESSMENT AND PLAN:  Discussed the following assessment and plan:  No diagnosis found.  *** -  Patient advised to return or notify a doctor immediately if symptoms worsen or persist or new concerns arise.  There are no Patient Instructions on file for this visit.  Colin Benton R., DO

## 2017-09-01 ENCOUNTER — Ambulatory Visit: Payer: BLUE CROSS/BLUE SHIELD | Admitting: Family Medicine

## 2017-09-01 DIAGNOSIS — Z0289 Encounter for other administrative examinations: Secondary | ICD-10-CM

## 2017-09-07 ENCOUNTER — Encounter: Payer: Self-pay | Admitting: Family Medicine

## 2018-05-04 ENCOUNTER — Telehealth: Payer: Self-pay | Admitting: Obstetrics & Gynecology

## 2018-05-04 ENCOUNTER — Other Ambulatory Visit: Payer: Self-pay | Admitting: Family Medicine

## 2018-05-04 DIAGNOSIS — Z1231 Encounter for screening mammogram for malignant neoplasm of breast: Secondary | ICD-10-CM

## 2018-05-04 NOTE — Telephone Encounter (Signed)
Spoke with patient. Patient reports left breast pain and palpable "nodules" on outer side of left breast. Symptoms noticed 1 wk ago. Denies skin changes or nipple d/c. Hx of hysterectomy. Patient attempted to schedule screening MMG today and was advised to call office.   Advised patient she would need OV for further evaluation, OV schduled for 05/10/18 at 1:45pm with Dr. Sabra Heck. Patient declined earlier appts offered.   Last screening MMG 10/20/14.  Routing to provider for final review. Patient is agreeable to disposition. Will close encounter.

## 2018-05-04 NOTE — Telephone Encounter (Signed)
Patient stated that she was calling to schedule her mammogram. Patient was asked if she was having any issues. Patient has "lumpy" tenderness in her left breast. Patient was told to hang up and call our office to change the mammogram order to a diagnostic screening.

## 2018-05-10 ENCOUNTER — Ambulatory Visit (INDEPENDENT_AMBULATORY_CARE_PROVIDER_SITE_OTHER): Payer: BLUE CROSS/BLUE SHIELD | Admitting: Obstetrics & Gynecology

## 2018-05-10 ENCOUNTER — Encounter: Payer: Self-pay | Admitting: Obstetrics & Gynecology

## 2018-05-10 ENCOUNTER — Other Ambulatory Visit: Payer: Self-pay

## 2018-05-10 VITALS — BP 136/80 | HR 96 | Resp 16 | Ht 62.75 in | Wt 169.6 lb

## 2018-05-10 DIAGNOSIS — N6320 Unspecified lump in the left breast, unspecified quadrant: Secondary | ICD-10-CM | POA: Diagnosis not present

## 2018-05-10 NOTE — Progress Notes (Signed)
[facility] contacted and scheduled Bilateral Diagnostic Mammogram with tomography and L Breast ultrasound for 05/14/18 at 2:10. Patient agreeable to time, date, location and appointment card given.

## 2018-05-10 NOTE — Progress Notes (Signed)
GYNECOLOGY  VISIT  CC:   Left breast pain and nodules  HPI: 42 y.o. G5P2 Married White or Caucasian female here for bilateral breast pain and left breast lumps that she noticed about a week ago.  She reports both breasts have been sore over the past few weeks.  Then the overall soreness went away but was left with just left sided breast pain.  Feels there are now several nodules on the outer side of her left breast.  She denies any trauma, breast bruising or skin changes.  She has no nipple discharge.    Last MMG was 10/28/14.  Her husband has been dealing with medical issues and she reports she is just let any of her personal medical needs be secondary.  She did trunk on schedule mammogram after feeling the nodules in her breast but was advised she needs to be seen.  No family hx of breast cancer.  GYNECOLOGIC HISTORY: No LMP recorded. Patient has had a hysterectomy. Contraception: hysterectomy  Menopausal hormone therapy: none  Patient Active Problem List   Diagnosis Date Noted  . Mild intermittent asthma 08/27/2014  . Fibrocystic breast changes 08/27/2014  . Anxiety state 11/29/2007    Past Medical History:  Diagnosis Date  . Anxiety   . Asthma   . Basal cell carcinoma of skin    Dr. Derrel Nip  . Fibrocystic breast changes 08/27/2014   No hx breast biopsy. No FH first degree relative with breast or ovarian ca. Getting yearly mammograms per prior PCP recs.     Past Surgical History:  Procedure Laterality Date  . LAPAROSCOPIC HYSTERECTOMY  2007   for cervical dysplasia    MEDS:   Current Outpatient Medications on File Prior to Visit  Medication Sig Dispense Refill  . albuterol (PROVENTIL HFA;VENTOLIN HFA) 108 (90 Base) MCG/ACT inhaler Inhale 2 puffs into the lungs 2 (two) times daily as needed. 3.7 g 6  . fluticasone (FLONASE) 50 MCG/ACT nasal spray Place into both nostrils daily.    Marland Kitchen loratadine (CLARITIN) 10 MG tablet Take 10 mg by mouth daily.    . Vitamin D,  Ergocalciferol, (DRISDOL) 50000 units CAPS capsule Take 1 capsule (50,000 Units total) by mouth every 7 (seven) days. 12 capsule 0   No current facility-administered medications on file prior to visit.     ALLERGIES: Shellfish allergy  Family History  Problem Relation Age of Onset  . Lung cancer Father        smoker and worked in [employer]  . Asthma Mother     SH: Married, non-smoker  Review of Systems  Genitourinary:       Breast pain Breast lumps   All other systems reviewed and are negative.   PHYSICAL EXAMINATION:    BP 136/80 (BP Location: Right Arm, Patient Position: Sitting, Cuff Size: Large)   Pulse 96   Resp 16   Ht 5' 2.75" (1.594 m)   Wt 169 lb 9.6 oz (76.9 kg)   BMI 30.28 kg/m     Physical Exam  Constitutional: She is oriented to person, place, and time. She appears well-developed and well-nourished.  Cardiovascular: Normal rate and regular rhythm.  Respiratory: Effort normal and breath sounds normal. Right breast exhibits no inverted nipple, no mass, no nipple discharge, no skin change and no tenderness. Left breast exhibits mass and tenderness. Left breast exhibits no inverted nipple, no nipple discharge and no skin change. Breasts are symmetrical.    Neurological: She is alert and oriented to person, place,  and time.  Psychiatric: She has a normal mood and affect.   Assessment: Possible cluster of breast cysts as 3 o'clock on left breast  Plan: Diagnostic bilateral MMG with left breast cyst will be ordered.  Pt understands she will receive results that day in the office and we will call afterwards if any additional follow up is needed.

## 2018-05-14 ENCOUNTER — Ambulatory Visit
Admission: RE | Admit: 2018-05-14 | Discharge: 2018-05-14 | Disposition: A | Discharge status: Home / Self Care | Payer: BLUE CROSS/BLUE SHIELD | Source: Ambulatory Visit | Attending: Obstetrics & Gynecology | Admitting: Obstetrics & Gynecology

## 2018-05-14 DIAGNOSIS — N6320 Unspecified lump in the left breast, unspecified quadrant: Secondary | ICD-10-CM

## 2018-05-14 DIAGNOSIS — R922 Inconclusive mammogram: Secondary | ICD-10-CM | POA: Diagnosis not present

## 2018-05-14 DIAGNOSIS — N6489 Other specified disorders of breast: Secondary | ICD-10-CM | POA: Diagnosis not present

## 2018-05-15 ENCOUNTER — Telehealth: Payer: Self-pay | Admitting: *Deleted

## 2018-05-15 ENCOUNTER — Ambulatory Visit: Payer: BLUE CROSS/BLUE SHIELD | Admitting: Family Medicine

## 2018-05-15 DIAGNOSIS — R002 Palpitations: Secondary | ICD-10-CM | POA: Diagnosis not present

## 2018-05-15 DIAGNOSIS — R531 Weakness: Secondary | ICD-10-CM | POA: Diagnosis not present

## 2018-05-15 DIAGNOSIS — R000 Tachycardia, unspecified: Secondary | ICD-10-CM | POA: Diagnosis not present

## 2018-05-15 DIAGNOSIS — R079 Chest pain, unspecified: Secondary | ICD-10-CM | POA: Diagnosis not present

## 2018-05-15 DIAGNOSIS — H53149 Visual discomfort, unspecified: Secondary | ICD-10-CM | POA: Diagnosis not present

## 2018-05-15 DIAGNOSIS — R42 Dizziness and giddiness: Secondary | ICD-10-CM | POA: Diagnosis not present

## 2018-05-15 DIAGNOSIS — R0602 Shortness of breath: Secondary | ICD-10-CM | POA: Diagnosis not present

## 2018-05-15 NOTE — Telephone Encounter (Signed)
Please let him know I am so sorry to hear this an hope she is ok. Praying.

## 2018-05-15 NOTE — Telephone Encounter (Signed)
I called the pt and informed her husband of the message below.  Appt scheduled for 9/19 at 2pm per his request.  Copied from [place] [phone]. Topic: General - Other >> May 15, 2018 12:59 PM Mcneil, Ja-Kwan wrote: Reason for CRM: Pt husband called in to advise Dr. Maudie Mercury that pt passed out at work and is going to [facility].

## 2018-05-17 ENCOUNTER — Ambulatory Visit (INDEPENDENT_AMBULATORY_CARE_PROVIDER_SITE_OTHER): Payer: BLUE CROSS/BLUE SHIELD | Admitting: Family Medicine

## 2018-05-17 ENCOUNTER — Encounter: Payer: Self-pay | Admitting: Family Medicine

## 2018-05-17 VITALS — BP 124/88 | HR 90 | Temp 98.3°F | Ht 62.75 in | Wt 171.1 lb

## 2018-05-17 DIAGNOSIS — F419 Anxiety disorder, unspecified: Secondary | ICD-10-CM

## 2018-05-17 DIAGNOSIS — G4730 Sleep apnea, unspecified: Secondary | ICD-10-CM | POA: Diagnosis not present

## 2018-05-17 DIAGNOSIS — R42 Dizziness and giddiness: Secondary | ICD-10-CM | POA: Diagnosis not present

## 2018-05-17 DIAGNOSIS — R000 Tachycardia, unspecified: Secondary | ICD-10-CM

## 2018-05-17 MED ORDER — PAROXETINE HCL 20 MG PO TABS: 20.0000 mg | ORAL_TABLET | Freq: Every day | ORAL | 1 refills | Status: DC

## 2018-05-17 NOTE — Patient Instructions (Signed)
BEFORE YOU LEAVE: -follow up: 1 month  Start the Paxil and take once daily.  Please start some therapy for anxiety and stress.  Limit caffeine.  Complete the monitor.  -We placed a referral for you as discussed to cardiology about the rapid heart rate and possible sleep apnea. It usually takes about 1-2 weeks to process and schedule this referral. If you have not heard from [place] regarding this appointment in 2 weeks please contact our office.   I hope you are feeling better soon! Seek care promptly if your symptoms worsen, new concerns arise or you are not improving with treatment.

## 2018-05-17 NOTE — Progress Notes (Signed)
HPI:  Using dictation device. Unfortunately this device frequently misinterprets words/phrases.  Alexandra Welch is a very pleasant 42 year old with a past medical history significant for anxiety and a lot of stress over the last several years with her husband dealing with severe illness here for an acute visit for anxiety and tachycardia. She has been having a lot more anxiety recently, increased palpitations, and her husband reports poor sleep secondary to sleep apnea.  She had a presyncopal episode a few days ago when she was at work.  She was noticing palpitations, then looked at her apple watch and noted that her heart rate was in the 160s, then she felt lightheaded and off-balance and felt like her vision was blacking out.  She did not have fall or loss of consciousness.  She was having some chest pain as well.  She was evaluated at an outside hospital, [facility].  She had a negative work-up including labs, chest x-ray, EKGs and troponins and was discharged home.  Today she reports she has a long history of palpitations to worried about it.  However recently she has noticed out of the blue her heart rate will run up in the 160s and with those episodes she will feel lightheaded if she tries to walk.  Several occasion she felt like she was going to pass out, but denies any episodes of syncope or falling.  She feels like her heart is pounding up in her throat during these times.  No exertional chest pain, shortness of breath, fevers, rashes.  She reports they did an extensive work-up in the ER including a thyroid check, heart valuation and chest x-rays.  Placed heart monitor and will send her the report in a few weeks.  She had an episode today when she was at rest.  She pushed the button on the monitor.  Better after a few minutes.  Her heart rate was in the 140s to 150s on her apple watch. She has a history of chronic anxiety, worsening lately.  She was on medications for  this in the past remotely for 10 years.  Paxil and did well.  Reports she was diagnosed with a generalized anxiety and panic disorder when she was a teenager.  He is considering treatment again.  She wonders if this could be contributing to the heart issue.   She also reports she continues to feel unrested when she wakes up.  Her husband reports that she stops breathing in her sleep.  She was referred for sleep apnea evaluation, but did not complete the evaluation.  Reports she wishes that she would have done it, but that she has been so busy with her husband's events and work that she has not been able to complete it.    ROS: See pertinent positives and negatives per HPI.  Past Medical History:  Diagnosis Date  . Anxiety   . Asthma   . Basal cell carcinoma of skin    Dr. Derrel Nip  . Fibrocystic breast changes 08/27/2014   No hx breast biopsy. No FH first degree relative with breast or ovarian ca. Getting yearly mammograms per prior PCP recs.     Past Surgical History:  Procedure Laterality Date  . LAPAROSCOPIC HYSTERECTOMY  2007   for cervical dysplasia    Family History  Problem Relation Age of Onset  . Lung cancer Father        smoker and worked in [employer]  . Asthma Mother  SOCIAL HX: see hpi   Current Outpatient Medications:  .  albuterol (PROVENTIL HFA;VENTOLIN HFA) 108 (90 Base) MCG/ACT inhaler, Inhale 2 puffs into the lungs 2 (two) times daily as needed., Disp: 3.7 g, Rfl: 6 .  Cholecalciferol (VITAMIN D PO), Take by mouth daily., Disp: , Rfl:  .  fluticasone (FLONASE) 50 MCG/ACT nasal spray, Place into both nostrils daily., Disp: , Rfl:  .  loratadine (CLARITIN) 10 MG tablet, Take 10 mg by mouth daily., Disp: , Rfl:  .  PARoxetine (PAXIL) 20 MG tablet, Take 1 tablet (20 mg total) by mouth daily., Disp: 30 tablet, Rfl: 1  EXAM:  Vitals:   05/17/18 1354  BP: 124/88  Pulse: 90  Temp: 98.3 F (36.8 C)    Body mass index is 30.55 kg/m.  GENERAL: vitals  reviewed and listed above, alert, oriented, appears well hydrated and in no acute distress  HEENT: atraumatic, conjunttiva clear, no obvious abnormalities on inspection of external nose and ears  NECK: no obvious masses on inspection  LUNGS: clear to auscultation bilaterally, no wheezes, rales or rhonchi, good air movement  CV: HRRR, no peripheral edema  MS: moves all extremities without noticeable abnormality  PSYCH: pleasant and cooperative, no obvious depression or anxiety  ASSESSMENT AND PLAN:  Discussed the following assessment and plan:  Tachyarrhythmia - Plan: Ambulatory referral to Cardiology Dizziness - Plan: Ambulatory referral to Cardiology Sleep apnea, unspecified type - Plan: Ambulatory referral to Cardiology -Sounds like she is having some symptomatic tachycardia -She had a preliminary evaluation at [facility] emergency department, wearing a ZioXT -She is agreeable to seeing a cardiologist for this, and also for evaluation for sleep apnea -Referral placed -In the interim advised promt and/ or emergency reevaluation if any worsening of her symptoms  Anxiety -Could be contributing to above -Discussed options for management and risks -She is considering scheduling CBT, number provided for [facility] discussed options -She opted to restart the Paxil, it sounds like she took this for a long time in the past and did well -1 month, sooner as needed   Patient Instructions  BEFORE YOU LEAVE: -follow up: 1 month  Start the Paxil and take once daily.  Please start some therapy for anxiety and stress.  Limit caffeine.  Complete the monitor.  -We placed a referral for you as discussed to cardiology about the rapid heart rate and possible sleep apnea. It usually takes about 1-2 weeks to process and schedule this referral. If you have not heard from [place] regarding this appointment in 2 weeks please contact our office.   I hope you  are feeling better soon! Seek care promptly if your symptoms worsen, new concerns arise or you are not improving with treatment.        Lucretia Kern, DO

## 2018-05-23 ENCOUNTER — Ambulatory Visit: Payer: Self-pay | Admitting: *Deleted

## 2018-05-23 NOTE — Telephone Encounter (Signed)
Pt called with complaints of a pinching back pain (seen 05/17/18 in office and discussed it then); for the past 3 days she can not sleep due to pain under her right shoulder radiating down her back and across the front of her chest; she states that she has had it a couple of weeks; she says that it is more painful when she moves her right shoulder forward, but at night it is a stabbing; recommendations made per nurse triage protocol; pt offered and accepted appointment with Dr Colin Benton, [facility], 05/24/18 at 1030; she verbalizes understanding; will route to office for notification of this upcoming appointment.    Reason for Disposition . [1] MODERATE back pain (e.g., interferes with normal activities) AND [2] present > 3 days  Answer Assessment - Initial Assessment Questions 1. ONSET: "When did the pain begin?"      2 weeks ago 2. LOCATION: "Where does it hurt?" (upper, mid or lower back)     Under right shoulder 3. SEVERITY: "How bad is the pain?"  (e.g., Scale 1-10; mild, moderate, or severe)   - MILD (1-3): doesn't interfere with normal activities    - MODERATE (4-7): interferes with normal activities or awakens from sleep    - SEVERE (8-10): excruciating pain, unable to do any normal activities      Moderate during the day; severe ate night 4. PATTERN: "Is the pain constant?" (e.g., yes, no; constant, intermittent)      constant 5. RADIATION: "Does the pain shoot into your legs or elsewhere?"     no 6. CAUSE:  "What do you think is causing the back pain?"      unsure 7. BACK OVERUSE:  "Any recent lifting of heavy objects, strenuous work or exercise?"     no 8. MEDICATIONS: "What have you taken so far for the pain?" (e.g., nothing, acetaminophen, NSAIDS)     Extra strength tylenol; heating pad 9. NEUROLOGIC SYMPTOMS: "Do you have any weakness, numbness, or problems with bowel/bladder control?"     no 10. OTHER SYMPTOMS: "Do you have any other symptoms?" (e.g., fever, abdominal  pain, burning with urination, blood in urine)       no 11. PREGNANCY: "Is there any chance you are pregnant?" (e.g., yes, no; LMP)       No hysterectomy  Protocols used: BACK PAIN-A-AH

## 2018-05-24 ENCOUNTER — Telehealth: Payer: Self-pay | Admitting: *Deleted

## 2018-05-24 ENCOUNTER — Ambulatory Visit (INDEPENDENT_AMBULATORY_CARE_PROVIDER_SITE_OTHER): Payer: BLUE CROSS/BLUE SHIELD

## 2018-05-24 ENCOUNTER — Encounter: Payer: Self-pay | Admitting: Family Medicine

## 2018-05-24 ENCOUNTER — Ambulatory Visit (INDEPENDENT_AMBULATORY_CARE_PROVIDER_SITE_OTHER): Payer: BLUE CROSS/BLUE SHIELD | Admitting: Family Medicine

## 2018-05-24 VITALS — BP 120/88 | HR 85 | Temp 98.8°F | Ht 62.75 in | Wt 171.5 lb

## 2018-05-24 DIAGNOSIS — M546 Pain in thoracic spine: Secondary | ICD-10-CM

## 2018-05-24 DIAGNOSIS — F419 Anxiety disorder, unspecified: Secondary | ICD-10-CM | POA: Diagnosis not present

## 2018-05-24 DIAGNOSIS — M4184 Other forms of scoliosis, thoracic region: Secondary | ICD-10-CM | POA: Diagnosis not present

## 2018-05-24 DIAGNOSIS — R000 Tachycardia, unspecified: Secondary | ICD-10-CM

## 2018-05-24 DIAGNOSIS — M5134 Other intervertebral disc degeneration, thoracic region: Secondary | ICD-10-CM

## 2018-05-24 DIAGNOSIS — M47814 Spondylosis without myelopathy or radiculopathy, thoracic region: Secondary | ICD-10-CM | POA: Diagnosis not present

## 2018-05-24 DIAGNOSIS — R030 Elevated blood-pressure reading, without diagnosis of hypertension: Secondary | ICD-10-CM

## 2018-05-24 MED ORDER — TIZANIDINE HCL 2 MG PO TABS: ORAL_TABLET | ORAL | 0 refills | Status: DC

## 2018-05-24 NOTE — Telephone Encounter (Signed)
-----   Message from Carney Bern sent at 05/24/2018  4:06 PM EDT -----  Have not been scheduled as of yet ? [phone] [facility] ----- Message ----- From: Agnes Lawrence, CMA Sent: 05/24/2018   1:18 PM EDT To: Pincus Badder, Can you help with this? Wendie Simmer    ----- Message ----- From: Lucretia Kern, DO Sent: 05/24/2018   1:02 PM EDT To: Agnes Lawrence, CMA  Can you check on the status of her cardiology referral and let her know? Thanks!

## 2018-05-24 NOTE — Telephone Encounter (Signed)
I called the pt and left a detailed message at her cell number per our referral coordinator stating the cardiology office has a long wait at this time and the referral will be reviewed.  I left a message stating someone will call with appt info or she can call the number below for an appt.

## 2018-05-24 NOTE — Progress Notes (Signed)
HPI:  Using dictation device. Unfortunately this device frequently misinterprets words/phrases.  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) -She has been doing some loading recently, but no heavy lifting -Worsening of the pain the last 4 days -Pain is sharp, sometimes burning in the lower thoracic region on the right and radiating to the right lower lateral rib area -Worse with reaching forward and down, worse with certain lying positions, better with standing still or sitting still -Denies fevers, chills, skin rashes, shortness of breath, cough, illness or recent falls or injuries -She tried taking Tylenol and using heat which helped a little bit -Has not been contacted yet to the cardiology office about the palpitations -Doing okay on the Paxil, no big change yet  ROS: See pertinent positives and negatives per HPI.  Past Medical History:  Diagnosis Date  . Anxiety   . Asthma   . Basal cell carcinoma of skin    Dr. Derrel Nip  . Fibrocystic breast changes 08/27/2014   No hx breast biopsy. No FH first degree relative with breast or ovarian ca. Getting yearly mammograms per prior PCP recs.     Past Surgical History:  Procedure Laterality Date  . LAPAROSCOPIC HYSTERECTOMY  2007   for cervical dysplasia    Family History  Problem Relation Age of Onset  . Lung cancer Father        smoker and worked in [employer]  . Asthma Mother     SOCIAL HX: see hpi   Current Outpatient Medications:  .  albuterol (PROVENTIL HFA;VENTOLIN HFA) 108 (90 Base) MCG/ACT inhaler, Inhale 2 puffs into the lungs 2 (two) times daily as needed., Disp: 3.7 g, Rfl: 6 .  Cholecalciferol (VITAMIN D PO), Take by mouth daily., Disp: , Rfl:  .  fluticasone (FLONASE) 50 MCG/ACT nasal spray, Place into both nostrils daily., Disp: , Rfl:  .  loratadine (CLARITIN) 10 MG  tablet, Take 10 mg by mouth daily., Disp: , Rfl:  .  PARoxetine (PAXIL) 20 MG tablet, Take 1 tablet (20 mg total) by mouth daily., Disp: 30 tablet, Rfl: 1 .  tiZANidine (ZANAFLEX) 2 MG tablet, Before bed, or every 12 hours as needed for spasm, Disp: 30 tablet, Rfl: 0  EXAM:  Vitals:   05/24/18 1038  BP: 120/88  Pulse: 85  Temp: 98.8 F (37.1 C)    Body mass index is 30.62 kg/m.  GENERAL: vitals reviewed and listed above, alert, oriented, appears well hydrated and in no acute distress  HEENT: atraumatic, conjunttiva clear, no obvious abnormalities on inspection of external nose and ears  NECK: no obvious masses on inspection  LUNGS: clear to auscultation bilaterally, no wheezes, rales or rhonchi, good air movement  CV: HRRR, no peripheral edema  MS: moves all extremities without noticeable abnormality, TTP T9-11 paraspinal tissues on the R, no skin rash or other abnormalities on inspection, no sig bony TTP, normal function of upper and lower extremities  PSYCH: pleasant and cooperative, no obvious depression or anxiety  ASSESSMENT AND PLAN:  Discussed the following assessment and plan:  Acute right-sided thoracic back pain - Plan: DG Thoracic Spine W/Swimmers  Tachyarrhythmia  Anxiety  Elevated blood pressure reading  -we discussed possible serious and likely etiologies, workup and treatment, treatment risks and return precautions -after this discussion, Alexandra Welch opted for plain films today, muscle relaxer, physical therapy versus referral  pending x-ray results -follow up advised as scheduled in a few weeks, sooner as needed -Last my assistant check on the cardiology referral -Diastolic blood pressure up a little, she feels from anxiety and the pain, will monitor and follow-up, advised may need to start an antihypertensive if remains elevated -of course, we advised Alexandra Welch  to return or notify a doctor immediately if symptoms worsen or persist or new concerns  arise.  .   Patient Instructions  BEFORE YOU LEAVE: - xray -follow up: keep follow up as scheduled  Tylenol 500-1000mg  up to 3 times per day as needed for pain.  Heat, ice and or topical menthol (tiger balm) as needed for pain.  Tizanidine at bed as needed for spasm.  We have ordered an xrayat this visit. It can take up to 5-7 days for results and processing. IF results require follow up or explanation, we will call you with instructions. Clinically stable results will be released to your [facility]. If you have not heard from [place] or cannot find your results in [facility]  please contact our office at [phone].  If you are not yet signed up for [facility], please consider signing up.           Lucretia Kern, DO

## 2018-05-24 NOTE — Patient Instructions (Signed)
BEFORE YOU LEAVE: - xray -follow up: keep follow up as scheduled  Tylenol 500-1000mg  up to 3 times per day as needed for pain.  Heat, ice and or topical menthol (tiger balm) as needed for pain.  Tizanidine at bed as needed for spasm.  We have ordered an xrayat this visit. It can take up to 5-7 days for results and processing. IF results require follow up or explanation, we will call you with instructions. Clinically stable results will be released to your [facility]. If you have not heard from [place] or cannot find your results in [facility]  please contact our office at [phone].  If you are not yet signed up for [facility], please consider signing up.

## 2018-05-27 ENCOUNTER — Encounter: Payer: Self-pay | Admitting: Family Medicine

## 2018-05-28 ENCOUNTER — Encounter: Payer: Self-pay | Admitting: Physical Therapy

## 2018-05-28 ENCOUNTER — Ambulatory Visit (INDEPENDENT_AMBULATORY_CARE_PROVIDER_SITE_OTHER): Payer: BLUE CROSS/BLUE SHIELD | Admitting: Physical Therapy

## 2018-05-28 DIAGNOSIS — M546 Pain in thoracic spine: Secondary | ICD-10-CM

## 2018-05-28 DIAGNOSIS — R293 Abnormal posture: Secondary | ICD-10-CM | POA: Diagnosis not present

## 2018-05-28 NOTE — Therapy (Addendum)
[place], [place], [address] Phone: [phone]   Fax:  [phone]  Physical Therapy Evaluation/Discharge  Patient Details  Name: Alexandra Welch MRN: [id number] Date of Birth: September 12, 1975 Referring Provider (PT): Colin Benton, DO   Encounter Date: 05/28/2018  PT End of Session - 05/28/18 1235    Visit Number  1    Number of Visits  12   plan to see 1x/wk but up to 2/wk if needed   Date for PT Re-Evaluation  07/09/18    Authorization Type  BCBS    Authorization - Number of Visits  60    PT Start Time  1150    PT Stop Time  1240    PT Time Calculation (min)  50 min    Activity Tolerance  Patient tolerated treatment well    Behavior During Therapy  [facility] for tasks assessed/performed       Past Medical History:  Diagnosis Date  . Anxiety   . Asthma   . Basal cell carcinoma of skin    Dr. Derrel Nip  . Fibrocystic breast changes 08/27/2014   No hx breast biopsy. No FH first degree relative with breast or ovarian ca. Getting yearly mammograms per prior PCP recs.     Past Surgical History:  Procedure Laterality Date  . LAPAROSCOPIC HYSTERECTOMY  2007   for cervical dysplasia    There were no vitals filed for this visit.   Subjective Assessment - 05/28/18 1152    Subjective  Pt is a 42 y/o female who presents to OPPT for intermittent back pain x 1 year.  Pt reports sudden onset of pain behind shoulder and radiating across chest x 2-3 weeks.        Pertinent History  asthma, anxiety    Limitations  Lifting    Diagnostic tests  xray: DDD and scoliosis    Patient Stated Goals  improve pain, sleep better    Currently in Pain?  Yes    Pain Score  4    up to 8/10; at best 3/10   Pain Location  Scapula    Pain Orientation  Right   under scapula   Pain Descriptors / Indicators  Sharp;Tightness;Throbbing    Pain Type  Acute pain    Pain Radiating Towards  radiating down Rt side of back     Pain Onset  1 to 4 weeks ago    Pain Frequency  Constant    Aggravating Factors   lifting    Pain Relieving Factors  heat, tylenol    Effect of Pain on Daily Activities  difficulty finding comfortable sleeping positions         [facility] PT Assessment - 05/28/18 1156      Assessment   Medical Diagnosis  M51.34 (ICD-10-CM) - DDD (degenerative disc disease), thoracic    Referring Provider (PT)  Colin Benton, DO    Onset Date/Surgical Date  --   2-3 weeks   Hand Dominance  Right    Next MD Visit  06/14/18    Prior Therapy  none      Precautions   Precautions  None      Restrictions   Weight Bearing Restrictions  No      Balance Screen   Has the patient fallen in the past 6 months  No    Has the patient had a decrease in activity level because of a fear of falling?   No  Is the patient reluctant to leave their home because of a fear of falling?   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    Leisure  hiking      Cognition   Overall Cognitive Status  Within Functional Limits for tasks assessed      Observation/Other Assessments   Focus on Therapeutic Outcomes (FOTO)   49 (52% limited; predicted 29% limited)      Posture/Postural Control   Posture/Postural Control  Postural limitations    Postural Limitations  Rounded Shoulders;Forward head      ROM / Strength   AROM / PROM / Strength  AROM;Strength      AROM   Overall AROM Comments  thoracolumbar ROM WNL with pulling sensation with Lt sidebending      Strength   Overall Strength Comments  bil shoulders 5/5 bil    Strength Assessment Site  --      Palpation   Palpation comment  trigger points noted Rt rhomboids, levator scapula, teres minor and infraspinatus; suspect trigger points in subscapularis given pt's subjective reports and clinical findings                 Objective measurements completed on examination: See above findings.      [facility] Adult PT Treatment/Exercise - 05/28/18 1156      Exercises   Exercises  Shoulder      Shoulder Exercises: Stretch   Other Shoulder Stretches  posterior capusule stretch and overhead Lt lean x 30 sec each for HEP instruction      Modalities   Modalities  Electrical Stimulation;Moist Heat      Moist Heat Therapy   Number Minutes Moist Heat  15 Minutes    Moist Heat Location  --   thoracic spine     Electrical Stimulation   Electrical Stimulation Location  Rt thoracic spine    Electrical Stimulation Action  IFC    Electrical Stimulation Parameters  to tolerance x 15 min    Electrical Stimulation Goals  Pain             PT Education - 05/28/18 1235    Education Details  HEP, DN, TENS, use of ball for myofascial release    Person(s) Educated  Patient    Methods  Explanation;Demonstration;Handout    Comprehension  Verbalized understanding;Returned demonstration          PT Long Term Goals - 05/28/18 1259      PT LONG TERM GOAL #1   Title  independent with HEP    Status  New    Target Date  07/09/18      PT LONG TERM GOAL #2   Title  demonstrate proper lifting techniques without increase in pain and to decrease risk of reinjury    Status  New    Target Date  07/09/18      PT LONG TERM GOAL #3   Title  report ability to sleep at least 6 hours without pain for improved function    Status  New    Target Date  07/09/18             Plan - 05/28/18 1254    Clinical Impression Statement  Pt is a 41 y/o female who presents to OPPT with recent exacerbation of Rt side  thoracic and sub scapular pain.  Pt demonstrates pain with ROM and active trigger points in Rt posterior shoulder and scapula.  Pt will benefit from PT to address deficits listed.    History and Personal Factors relevant to plan of care:  anxiety, asthma    Clinical Presentation  Stable     Clinical Decision Making  Low    Rehab Potential  Good    PT Frequency  2x / week   plan to see 1x/wk; will increase to 2x/wk if needed   PT Duration  6 weeks    PT Treatment/Interventions  ADLs/Self Care Home Management;Cryotherapy;Electrical Stimulation;Ultrasound;Moist Heat;Functional mobility training;Therapeutic activities;Therapeutic exercise;Patient/family education;Manual techniques;Taping;Dry needling    PT Next Visit Plan  review stretches, manual/DN/modalities; postural exercises    PT Home Exercise Plan  Access Code: [email or web address]    Consulted and Agree with Plan of Care  Patient       Patient will benefit from skilled therapeutic intervention in order to improve the following deficits and impairments:  Increased fascial restricitons, Increased muscle spasms, Pain, Postural dysfunction, Impaired flexibility  Visit Diagnosis: Pain in thoracic spine - Plan: PT plan of care cert/re-cert  Abnormal posture - Plan: PT plan of care cert/re-cert     Problem List Patient Active Problem List   Diagnosis Date Noted  . Mild intermittent asthma 08/27/2014  . Fibrocystic breast changes 08/27/2014  . Anxiety state 11/29/2007      Laureen Abrahams, PT, DPT 05/28/18 1:31 PM     [facility] [place], [place], [address] Phone: [phone]   Fax:  [phone]  Name: Alexandra Welch MRN: [id number] Date of Birth: 1975-12-12     PHYSICAL THERAPY DISCHARGE SUMMARY  Visits from Start of Care: 1  Current functional level related to goals / functional outcomes: See above   Remaining deficits: Unknown, pt did not return   Education / Equipment: HEP  Plan: Patient agrees to discharge.  Patient goals were not met. Patient is being discharged due to not returning since the last visit.  ?????     Laureen Abrahams, PT, DPT 07/16/18 1:17 PM  [facility] [place],  [address]  [phone] (office) [phone] (fax)

## 2018-05-28 NOTE — Patient Instructions (Signed)
Access Code: [email or web address]  URL: [email or web address]  Date: 05/28/2018  Prepared by: Faustino Congress   Exercises  Standing Shoulder Posterior Capsule Stretch - 3 reps - 1 sets - 30 sec hold - 2x daily - 7x weekly  TL Sidebending Stretch - Arms Overhead - 3 reps - 1 sets - 30 sec hold - 2x daily - 7x weekly  Patient Education  Trigger Point Dry Needling  TENS UNIT

## 2018-05-29 ENCOUNTER — Ambulatory Visit (INDEPENDENT_AMBULATORY_CARE_PROVIDER_SITE_OTHER): Payer: BLUE CROSS/BLUE SHIELD | Admitting: Obstetrics & Gynecology

## 2018-05-29 ENCOUNTER — Other Ambulatory Visit: Payer: Self-pay

## 2018-05-29 ENCOUNTER — Encounter

## 2018-05-29 ENCOUNTER — Ambulatory Visit: Payer: BLUE CROSS/BLUE SHIELD | Admitting: Obstetrics & Gynecology

## 2018-05-29 ENCOUNTER — Encounter: Payer: Self-pay | Admitting: Obstetrics & Gynecology

## 2018-05-29 VITALS — BP 132/70 | HR 92 | Resp 16 | Ht 62.5 in | Wt 170.2 lb

## 2018-05-29 DIAGNOSIS — Z01419 Encounter for gynecological examination (general) (routine) without abnormal findings: Secondary | ICD-10-CM | POA: Diagnosis not present

## 2018-05-29 NOTE — Progress Notes (Signed)
42 y.o. G34P2 Married White or Caucasian female here for annual exam.  Having episodes palpitations that are very frequent.  This started about three weeks ago and the first episode also caused some dizziness so she went to the ER.  Wore the Holter monitor for 10 days.  Has appt with cardiology next week.    Denies vaginal bleeding.    Does have hx of anxiety.  Has recently started Paxil.  Seeing Dr. Maudie Mercury.  She can't tell a difference right now but just started it.  Feeling a lot of stressors with work, finances of family, husband's chronic/terminal illness.  Just not where she thought she'd be at this point in her life.  Patient's last menstrual period was 08/29/2005 (approximate).          Sexually active: Yes.    The current method of family planning is status post hysterectomy.    Exercising: Yes.    walking/ jogging  Smoker:  no  Health Maintenance: Pap:  11/29/16 Neg   11/09/15 Neg. HR HPV:neg  History of abnormal Pap:  yes MMG:  05/14/18 Bilateral diagnostic with left [place] Left BIRADS1:Neg. F/u 1 year  Colonoscopy:  Never BMD:   Never TDaP:  2012 Screening Labs: PCP   reports that she has never smoked. She has never used smokeless tobacco. She reports that she drinks about 1.0 - 2.0 standard drinks of alcohol per week. She reports that she does not use drugs.  Past Medical History:  Diagnosis Date  . Anxiety   . Asthma   . Basal cell carcinoma of skin    Dr. Derrel Nip  . Fibrocystic breast changes 08/27/2014   No hx breast biopsy. No FH first degree relative with breast or ovarian ca. Getting yearly mammograms per prior PCP recs.     Past Surgical History:  Procedure Laterality Date  . LAPAROSCOPIC HYSTERECTOMY  2007   for cervical dysplasia    Current Outpatient Medications  Medication Sig Dispense Refill  . albuterol (PROVENTIL HFA;VENTOLIN HFA) 108 (90 Base) MCG/ACT inhaler Inhale 2 puffs into the lungs 2 (two) times daily as needed. 3.7 g 6  . Cholecalciferol (VITAMIN  D PO) Take by mouth daily.    . fluticasone (FLONASE) 50 MCG/ACT nasal spray Place into both nostrils daily.    Marland Kitchen loratadine (CLARITIN) 10 MG tablet Take 10 mg by mouth daily.    Marland Kitchen PARoxetine (PAXIL) 20 MG tablet Take 1 tablet (20 mg total) by mouth daily. 30 tablet 1  . tiZANidine (ZANAFLEX) 2 MG tablet Before bed, or every 12 hours as needed for spasm 30 tablet 0   No current facility-administered medications for this visit.     Family History  Problem Relation Age of Onset  . Lung cancer Father        smoker and worked in [employer]  . Asthma Mother     Review of Systems  Cardiovascular: Positive for palpitations.  All other systems reviewed and are negative.   Exam:   BP 132/70 (BP Location: Right Arm, Patient Position: Sitting, Cuff Size: Large)   Pulse 92   Resp 16   Ht 5' 2.5" (1.588 m)   Wt 170 lb 3.2 oz (77.2 kg)   LMP 08/29/2005 (Approximate)   BMI 30.63 kg/m    Height: 5' 2.5" (158.8 cm)  Ht Readings from Last 3 Encounters:  05/29/18 5' 2.5" (1.588 m)  05/24/18 5' 2.75" (1.594 m)  05/17/18 5' 2.75" (1.594 m)    General appearance: alert,  cooperative and appears stated age Head: Normocephalic, without obvious abnormality, atraumatic Neck: no adenopathy, supple, symmetrical, trachea midline and thyroid normal to inspection and palpation Lungs: clear to auscultation bilaterally Breasts: normal appearance, no masses or tenderness Heart: regular rate and rhythm Abdomen: soft, non-tender; bowel sounds normal; no masses,  no organomegaly Extremities: extremities normal, atraumatic, no cyanosis or edema Skin: Skin color, texture, turgor normal. No rashes or lesions Lymph nodes: Cervical, supraclavicular, and axillary nodes normal. No abnormal inguinal nodes palpated Neurologic: Grossly normal   Pelvic: External genitalia:  no lesions              Urethra:  normal appearing urethra with no masses, tenderness or lesions              Bartholins and Skenes: normal                  Vagina: normal appearing vagina with normal color and discharge, no lesions              Cervix: absent              Pap taken: No. Bimanual Exam:  Uterus:  uterus absent              Adnexa: no mass, fullness, tenderness               Rectovaginal: Confirms               Anus:  normal sphincter tone, no lesions  Chaperone was present for exam.  A:  Well Woman with normal exam H/O LAVH 4/06 due to abnormal pathology H/O elevated lipids H/O left labial hemangioma Stressors  P:   Mammogram up to date.  Just completed diagnostic after missing for several years.  This was negative pap smear neg 2014, 2015, neg pap with neg hr hpv 3/17, neg pap 2018.  Feel can skip pap this year Blood work done with Dr. Maudie Mercury return annually or prn

## 2018-06-04 ENCOUNTER — Encounter: Payer: BLUE CROSS/BLUE SHIELD | Admitting: Physical Therapy

## 2018-06-06 ENCOUNTER — Ambulatory Visit (INDEPENDENT_AMBULATORY_CARE_PROVIDER_SITE_OTHER): Payer: BLUE CROSS/BLUE SHIELD | Admitting: Internal Medicine

## 2018-06-06 VITALS — BP 152/88 | HR 94 | Ht 63.0 in | Wt 169.0 lb

## 2018-06-06 DIAGNOSIS — R0683 Snoring: Secondary | ICD-10-CM | POA: Diagnosis not present

## 2018-06-06 DIAGNOSIS — R002 Palpitations: Secondary | ICD-10-CM | POA: Diagnosis not present

## 2018-06-06 DIAGNOSIS — R42 Dizziness and giddiness: Secondary | ICD-10-CM

## 2018-06-06 DIAGNOSIS — R079 Chest pain, unspecified: Secondary | ICD-10-CM

## 2018-06-06 NOTE — Patient Instructions (Signed)
Medication Instructions:  Your physician recommends that you continue on your current medications as directed. Please refer to the Current Medication list given to you today.  If you need a refill on your cardiac medications before your next appointment, please call your pharmacy.   Lab work: None ordered  Testing/Procedures: Your physician has requested that you have a stress echocardiogram. For further information please visit [email or web address]. Please follow instruction sheet as given.  Your physician has recommended that you wear a holter monitor. Holter monitors are medical devices that record the heart's electrical activity. Doctors most often use these monitors to diagnose arrhythmias. Arrhythmias are problems with the speed or rhythm of the heartbeat. The monitor is a small, portable device. You can wear one while you do your normal daily activities. This is usually used to diagnose what is causing palpitations/syncope (passing out).  Your physician has recommended that you have a sleep study. This test records several body functions during sleep, including: brain activity, eye movement, oxygen and carbon dioxide blood levels, heart rate and rhythm, breathing rate and rhythm, the flow of air through your mouth and nose, snoring, body muscle movements, and chest and belly movement.    Follow-Up: At [facility], you and your health needs are our priority.  As part of our continuing mission to provide you with exceptional heart care, we have created designated Provider Care Teams.  These Care Teams include your primary Cardiologist (physician) and Advanced Practice Providers (APPs -  Physician Assistants and Nurse Practitioners) who all work together to provide you with the care you need, when you need it.  You will need a follow up appointment in 4-6 weeks with Dr.Acharya

## 2018-06-06 NOTE — Progress Notes (Signed)
Cardiology Office Note:    Date:  00/03/6760   ID:  Alexandra Welch, DOB [birth year], MRN [id number]  PCP:  Lucretia Kern, DO  Cardiologist:  No primary care provider on file.  Electrophysiologist:  None   Referring MD: Lucretia Kern, DO   Chief Complaint  Patient presents with  . Chest Pain  . Shortness of Breath  . Dizziness   History of Present Illness:    Alexandra Welch is a 42 y.o. female with a hx of anxiety and asthma who presents today for further evaluation of palpitations, dizziness, and a deep central chest pain.  She presents at the request of Dr. Colin Benton.  She has over time had a sensation of palpitations but never thought it was more than anxiety related.  However in the past month she has noticed daily palpitations which she describes as a feeling of heart racing or flopping in her chest.  They are most prominent at night.  They are not associated with any particular foods or times of day otherwise.  She has an associated dizziness that was accompanied by presyncope once.  She also experiences a deep, aching chest pain with palpitations, as well as shortness of breath.  She is having daily symptoms now.  With regard to her chest pain it is also predominantly at night, however also occurs with bending over.  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  Of note when she helps to unload the boxes and participates in physical activity including running around the restaurant, she does not note any exertional chest pain.  She does however note that she will have worse chest pain after eating a large meal, and has actually stopped eating very full meals because of the symptom.  Her son is a paramedic and one evening she went to his place of work describing the symptoms.  He took a 3-lead rhythm strip, which I have had a chance to review during our office visit.  This demonstrated a  PVC, and possibly a PAC as well.  Tobacco: never Caffeine: used to drink coffee, has stopped and is currently drinking decaf coffee Alcohol: none Water intake: adequate Snoring: yes, has been previously referred for sleep study, will complete that with [place] TSH: thyroid cascade within normal limits. Herbal supplements/diet products: none currently Syncope/presyncope: one episode of presyncope associated with dizziness and palpitations. No lifetime syncope.   Past Medical History:  Diagnosis Date  . Anxiety   . Asthma   . Basal cell carcinoma of skin    Dr. Derrel Nip  . Fibrocystic breast changes 08/27/2014   No hx breast biopsy. No FH first degree relative with breast or ovarian ca. Getting yearly mammograms per prior PCP recs.     Past Surgical History:  Procedure Laterality Date  . LAPAROSCOPIC HYSTERECTOMY  2007   for cervical dysplasia    Current Medications: Current Meds  Medication Sig  . albuterol (PROVENTIL HFA;VENTOLIN HFA) 108 (90 Base) MCG/ACT inhaler Inhale 2 puffs into the lungs 2 (two) times daily as needed.  . Cholecalciferol (VITAMIN D PO) Take by mouth daily.  . fluticasone (FLONASE) 50 MCG/ACT nasal spray Place into both nostrils daily.  Marland Kitchen loratadine (CLARITIN) 10 MG tablet Take 10 mg by mouth daily.  Marland Kitchen PARoxetine (PAXIL) 20 MG tablet Take 1 tablet (20 mg total) by mouth daily.  Marland Kitchen tiZANidine (ZANAFLEX) 2  MG tablet Before bed, or every 12 hours as needed for spasm    Allergies:   Shellfish allergy   Social History   Socioeconomic History  . Marital status: Married    Spouse name: Not on file  . Number of children: Not on file  . Years of education: Not on file  . Highest education level: Not on file  Occupational History  . Not on file  Social Needs  . Financial resource strain: Not on file  . Food insecurity:    Worry: Not on file    Inability: Not on file  . Transportation needs:    Medical: Not on file    Non-medical: Not on file  Tobacco  Use  . Smoking status: Never Smoker  . Smokeless tobacco: Never Used  Substance and Sexual Activity  . Alcohol use: Yes    Alcohol/week: 1.0 - 2.0 standard drinks    Types: 1 - 2 Standard drinks or equivalent per week    Comment: occ- 1-2 times per month  . Drug use: No  . Sexual activity: Yes    Partners: Male    Birth control/protection: Surgical    Comment: hysterectomy  Lifestyle  . Physical activity:    Days per week: Not on file    Minutes per session: Not on file  . Stress: Not on file  Relationships  . Social connections:    Talks on phone: Not on file    Gets together: Not on file    Attends religious service: Not on file    Active member of club or organization: Not on file    Attends meetings of clubs or organizations: Not on file    Relationship status: Not on file  Other Topics Concern  . Not on file  Social History Narrative   Work or School: Scientist, clinical (histocompatibility and immunogenetics) [employer]      Home Situation: lives with husband and two older children      Spiritual Beliefs: Baptist      Lifestyle: exercise is sporadic; diet is ok        Family History: The patient's family history includes Asthma in her mother; Lung cancer in her father.  No family history of coronary artery disease, sudden cardiac death, or unexplained drowning.  ROS:   Please see the history of present illness.    All other systems reviewed and are negative.  EKGs/Labs/Other Studies Reviewed:    The following studies were reviewed today: Exercise treadmill test 06/08/2017  Blood pressure demonstrated a normal response to exercise.  No T wave inversion was noted during stress.  There was no ST segment deviation noted during stress.  Overall, the patient's exercise capacity was normal  [facility] Treadmill Score: low risk   EKG:  EKG is ordered today.  The ekg ordered today demonstrates sinus rhythm with sinus arrhythmia, ventricular rate of 94, and no ventricular or atrial ectopy.  Recent  Labs: No results found for requested labs within last 8760 hours.  Recent Lipid Panel    Component Value Date/Time   CHOL 223 (H) 12/21/2015 0951   TRIG 67 12/21/2015 0951   HDL 83 12/21/2015 0951   CHOLHDL 2.7 12/21/2015 0951   VLDL 13 12/21/2015 0951   LDLCALC 127 12/21/2015 0951   LDLDIRECT 136.5 08/12/2013 0857    Physical Exam:    VS:  BP (!) 152/88   Pulse 94   Ht 5\' 3"  (1.6 m)   Wt 169 lb (76.7 kg)   LMP  08/29/2005 (Approximate)   BMI 29.94 kg/m     Wt Readings from Last 3 Encounters:  06/06/18 169 lb (76.7 kg)  05/29/18 170 lb 3.2 oz (77.2 kg)  05/24/18 171 lb 8 oz (77.8 kg)     GEN: Well nourished, well developed in no acute distress HEENT: Normal NECK: No JVD; No carotid bruits LYMPHATICS: No lymphadenopathy CARDIAC: Regular rhythm, normal rate, no murmurs, rubs, gallops RESPIRATORY:  Clear to auscultation without rales, wheezing or rhonchi  ABDOMEN: Soft, non-tender, non-distended MUSCULOSKELETAL:  No edema; No deformity  SKIN: Warm and dry NEUROLOGIC:  Alert and oriented x 3 PSYCHIATRIC:  Normal affect   ASSESSMENT:    1. Snoring   2. Palpitations   3. Chest pain, unspecified type   4. Dizziness    PLAN:    In order of problems listed above:  Snoring: we will go forward with a sleep study, she scores an 18 on the Epworth sleepiness scale. With ventricular and atrial ectopy, it is important to determine if she has sleep apnea. She does stop breathing at night as documented by her husband.  Palpitations: daily palpitations, so we will order a 48 hour Holter monitor to determine burden and nature of palpitations. I suspect ventricular ectopy.   Chest pain and dizziness: we will obtain and echocardiogram with treadmill stress echocardiogram to determine the structure and function of the heart. Specifically I am interested in regional wall motion at rest and with sterss, areas of potential scar, and and structural abnormalities. Her physical exam is  normal, so I suspect her resting echo may be structurally normal.  Will see her back in 4-6 weeks to assess further.   Medication Adjustments/Labs and Tests Ordered: Current medicines are reviewed at length with the patient today.  Concerns regarding medicines are outlined above.  Orders Placed This Encounter  Procedures  . HOLTER MONITOR - 48 HOUR  . EKG 12-Lead  . ECHOCARDIOGRAM STRESS TEST  . Split night study   No orders of the defined types were placed in this encounter.   Patient Instructions  Medication Instructions:  Your physician recommends that you continue on your current medications as directed. Please refer to the Current Medication list given to you today.  If you need a refill on your cardiac medications before your next appointment, please call your pharmacy.   Lab work: None ordered  Testing/Procedures: Your physician has requested that you have a stress echocardiogram. For further information please visit [email or web address]. Please follow instruction sheet as given.  Your physician has recommended that you wear a holter monitor. Holter monitors are medical devices that record the heart's electrical activity. Doctors most often use these monitors to diagnose arrhythmias. Arrhythmias are problems with the speed or rhythm of the heartbeat. The monitor is a small, portable device. You can wear one while you do your normal daily activities. This is usually used to diagnose what is causing palpitations/syncope (passing out).  Your physician has recommended that you have a sleep study. This test records several body functions during sleep, including: brain activity, eye movement, oxygen and carbon dioxide blood levels, heart rate and rhythm, breathing rate and rhythm, the flow of air through your mouth and nose, snoring, body muscle movements, and chest and belly movement.    Follow-Up: At [facility], you and your health needs are our priority.  As part of our  continuing mission to provide you with exceptional heart care, we have created designated Provider Care Teams.  These Care Teams include  your primary Cardiologist (physician) and Advanced Practice Providers (APPs -  Physician Assistants and Nurse Practitioners) who all work together to provide you with the care you need, when you need it.  You will need a follow up appointment in 4-6 weeks with Dr.Acharya        Signed, Elouise Munroe, MD  06/06/2018 3:51 PM    [place]

## 2018-06-07 ENCOUNTER — Telehealth (HOSPITAL_COMMUNITY): Payer: Self-pay | Admitting: *Deleted

## 2018-06-07 ENCOUNTER — Telehealth: Payer: Self-pay | Admitting: *Deleted

## 2018-06-07 NOTE — Telephone Encounter (Signed)
Patient notified of in lab sleep study appointment scheduled for 07/18/18 @ [facility].

## 2018-06-07 NOTE — Telephone Encounter (Signed)
Patient given detailed instructions per Stress Test Requisition Sheet for test on 06/14/18 at 2:30.Patient Notified to arrive 30 minutes early, and that it is imperative to arrive on time for appointment to keep from having the test rescheduled.  Patient verbalized understanding. Veronia Beets

## 2018-06-14 ENCOUNTER — Other Ambulatory Visit: Payer: Self-pay

## 2018-06-14 ENCOUNTER — Ambulatory Visit (HOSPITAL_COMMUNITY): Payer: BLUE CROSS/BLUE SHIELD

## 2018-06-14 ENCOUNTER — Ambulatory Visit (HOSPITAL_COMMUNITY): Payer: BLUE CROSS/BLUE SHIELD | Attending: Internal Medicine

## 2018-06-14 ENCOUNTER — Ambulatory Visit (INDEPENDENT_AMBULATORY_CARE_PROVIDER_SITE_OTHER): Payer: BLUE CROSS/BLUE SHIELD | Admitting: Family Medicine

## 2018-06-14 ENCOUNTER — Encounter: Payer: Self-pay | Admitting: Family Medicine

## 2018-06-14 ENCOUNTER — Ambulatory Visit: Payer: BLUE CROSS/BLUE SHIELD | Admitting: Family Medicine

## 2018-06-14 VITALS — BP 148/88 | HR 104 | Temp 99.0°F | Ht 63.0 in | Wt 168.7 lb

## 2018-06-14 DIAGNOSIS — R030 Elevated blood-pressure reading, without diagnosis of hypertension: Secondary | ICD-10-CM

## 2018-06-14 DIAGNOSIS — R002 Palpitations: Secondary | ICD-10-CM

## 2018-06-14 DIAGNOSIS — F411 Generalized anxiety disorder: Secondary | ICD-10-CM | POA: Diagnosis not present

## 2018-06-14 DIAGNOSIS — R079 Chest pain, unspecified: Secondary | ICD-10-CM | POA: Diagnosis not present

## 2018-06-14 NOTE — Patient Instructions (Addendum)
BEFORE YOU LEAVE: -follow up: 3 months  Call [facility] early tomorrow morning. Norlene Duel, spoke with Dennison Bulla (counselor) at [place] pm today by cell phone and he said he could see you at 1 or 2 pm tomorrow, Friday October 18th.  Consider propranolol pending cardiology evaluation.  I hope you are feeling better soon! Seek care promptly if your symptoms worsen, new concerns arise or you are not improving with treatment.

## 2018-06-14 NOTE — Progress Notes (Signed)
HPI:  Using dictation device. Unfortunately this device frequently misinterprets words/phrases.  Alexandra Welch is a very pleasant 42 year old here for follow-up.  She is seeing cardiology about the palpitations.  Reports they have gotten worse and worse.  She does think anxiety is contributing.  She literally just broke from getting a stress echo to her appointment here.  Reports she is anxious getting urine time.  Heart rate is still elevated along with blood pressure when she arrived.  Ongoing intermittent palpitations with chest discomfort.  Reports her cardiologist for Holter monitor report soon.  The back pain has resolved. She is scheduled to see a counselor about the anxiety, but the first appointment was not until next month.  She would like to see someone sooner if possible.  She has daily and frequent anxiety.  The palpitations cause her anxiety to be worse.  No syncope, no severe depression, no thoughts of harm.  Continues to take Paxil.  Her anxiety is worse at work and when she has.  ROS: See pertinent positives and negatives per HPI.  Past Medical History:  Diagnosis Date  . Anxiety   . Asthma   . Basal cell carcinoma of skin    Dr. Derrel Nip  . Fibrocystic breast changes 08/27/2014   No hx breast biopsy. No FH first degree relative with breast or ovarian ca. Getting yearly mammograms per prior PCP recs.     Past Surgical History:  Procedure Laterality Date  . LAPAROSCOPIC HYSTERECTOMY  2007   for cervical dysplasia    Family History  Problem Relation Age of Onset  . Lung cancer Father        smoker and worked in [employer]  . Asthma Mother     SOCIAL HX: See HPI   Current Outpatient Medications:  .  albuterol (PROVENTIL HFA;VENTOLIN HFA) 108 (90 Base) MCG/ACT inhaler, Inhale 2 puffs into the lungs 2 (two) times daily as needed., Disp: 3.7 g, Rfl: 6 .  Cholecalciferol (VITAMIN D PO), Take by mouth daily., Disp: , Rfl:  .  fluticasone (FLONASE) 50 MCG/ACT  nasal spray, Place into both nostrils daily., Disp: , Rfl:  .  loratadine (CLARITIN) 10 MG tablet, Take 10 mg by mouth daily., Disp: , Rfl:  .  PARoxetine (PAXIL) 20 MG tablet, Take 1 tablet (20 mg total) by mouth daily., Disp: 30 tablet, Rfl: 1 .  tiZANidine (ZANAFLEX) 2 MG tablet, Before bed, or every 12 hours as needed for spasm, Disp: 30 tablet, Rfl: 0  EXAM:  Vitals:   06/14/18 1647 06/14/18 1648  BP: 118/80 (!) 148/88  Pulse: (!) 104   Temp: 99 F (37.2 C)     Body mass index is 29.88 kg/m.  GENERAL: vitals reviewed and listed above, alert, oriented, appears well hydrated and in no acute distress  HEENT: atraumatic, conjunttiva clear, no obvious abnormalities on inspection of external nose and ears  NECK: no obvious masses on inspection  LUNGS: clear to auscultation bilaterally, no wheezes, rales or rhonchi, good air movement  CV: HRRR, no peripheral edema  MS: moves all extremities without noticeable abnormality  PSYCH: pleasant and cooperative, no obvious depression or anxiety  ASSESSMENT AND PLAN:  Discussed the following assessment and plan:  Anxiety state  Palpitations  Elevated blood pressure reading  -We talked with her here, Dennison Bulla, and he is able to see her tomorrow around 1 or 2 PM.  He asked that she call the [place] [facility] office first thing in the morning so  that they can reschedule her appointment.  Information provided.  I think adding CBT would be very beneficial.  Also will consider changing medication.  Talked about the possibility of propranolol which may help with anxiety, palpitations and elevated blood pressure. -Her blood pressure was better on recheck.  She reports her cardiologist is considering a beta-blocker, but wants to wait on the results of the heart test. -Follow-up here in 3 months, sooner as needed -Patient advised to return or notify a doctor immediately if symptoms worsen or persist or new concerns  arise.  Patient Instructions  BEFORE YOU LEAVE: -follow up: 3 months  Call [facility] early tomorrow morning. Norlene Duel, spoke with Dennison Bulla (counselor) at [place] pm today by cell phone and he said he could see you at 1 or 2 pm tomorrow, Friday October 18th.  Consider propranolol pending cardiology evaluation.  I hope you are feeling better soon! Seek care promptly if your symptoms worsen, new concerns arise or you are not improving with treatment.      Lucretia Kern, DO

## 2018-06-15 ENCOUNTER — Encounter: Payer: Self-pay | Admitting: Physical Therapy

## 2018-06-15 ENCOUNTER — Ambulatory Visit: Payer: BLUE CROSS/BLUE SHIELD | Admitting: Psychology

## 2018-06-15 DIAGNOSIS — F411 Generalized anxiety disorder: Secondary | ICD-10-CM

## 2018-06-18 ENCOUNTER — Telehealth: Payer: Self-pay

## 2018-06-18 ENCOUNTER — Telehealth: Payer: Self-pay | Admitting: Internal Medicine

## 2018-06-18 ENCOUNTER — Ambulatory Visit (INDEPENDENT_AMBULATORY_CARE_PROVIDER_SITE_OTHER): Payer: BLUE CROSS/BLUE SHIELD

## 2018-06-18 DIAGNOSIS — Z01812 Encounter for preprocedural laboratory examination: Secondary | ICD-10-CM

## 2018-06-18 DIAGNOSIS — R002 Palpitations: Secondary | ICD-10-CM

## 2018-06-18 DIAGNOSIS — R079 Chest pain, unspecified: Secondary | ICD-10-CM

## 2018-06-18 MED ORDER — METOPROLOL TARTRATE 100 MG PO TABS: ORAL_TABLET | ORAL | 0 refills | Status: DC

## 2018-06-18 NOTE — Telephone Encounter (Signed)
Spoke with pt. Adv her that Dr.Acharya is reviewing her stress echo and I will call back with Dr.Acharya's recommendation.  Adv pt that per Dr.Acharya the stress portion of her test was normal, but she will need additional imaging testing. Adv her that I will call back to discuss more in detail.  Pt sts that she has been having increased palpitations since she was last seen. She had her holter monitor placed today. Pt denies increased caffeine intake and feels it could be related to stress.  She also reports intermittent episodes of chest pain that started yesterday around 1pm. Pt described the discomfort as tightness and rate it a 5-6 on a scale of 0-10. Pt sts that the episodes yesterday lasted from 1-9pm. She did have some relief with rest. Pt sts that she felt a little weak, she denies syncope or near syncope. She sts that she checked her pulses (radial, carotid) and they felt weaker than normal.  Pt sts that the discomfort began again this afternoon but not quite as bad as yesterday. She sts that it seems to happen late afternoon. Adv pt that I will update Dr.Acharya and call back with her recommendation. Pt agreeable with plan and verbalized understanding.

## 2018-06-18 NOTE — Telephone Encounter (Signed)
New Message   Pt is calling to check if her result are back yet. Please call

## 2018-06-18 NOTE — Telephone Encounter (Signed)
Pt aware of stress echo and Dr.Acharya's recommendation. Dr.A discussed results with  Notes recorded by Elouise Munroe, MD on 06/18/2018 at 5:02 PM EDT Normal stress portion of the echocardiogram - no ischemia seen at 10 mets of exercise. This is very good.  There is a large coronary sinus (vein draining into the right side of the heart), and with this we need to evaluate for a left sided superior vena cava. She is also calling in with continued chest pain. I recommend CTA for coronaries, but scan the chest from the clavicles to the diaphram so that I may evaluate for left sided SVC, and anomalous pulmonary veins.   If she has severe or persistent chest pain that is worrisome to her, she should present to the emergency department.

## 2018-06-18 NOTE — Telephone Encounter (Signed)
Lmom. Dr.Acharya is aware of the pt call. I will call her to discuss her stress echo results and Dr.Acharya's recommendation as soon as Dr.A reviews and adv on the results.

## 2018-06-19 NOTE — Telephone Encounter (Signed)
-----   Message from Elouise Munroe, MD sent at 06/18/2018  5:02 PM EDT ----- Normal stress portion of the echocardiogram - no ischemia seen at 10 mets of exercise. This is very good.  There is a large coronary sinus (vein draining into the right side of the heart), and with this we need to evaluate for a left sided superior vena cava. She is also calling in with continued chest pain. I recommend CTA for coronaries, but scan the chest from the clavicles to the diaphram so that I may evaluate for left sided SVC, and anomalous pulmonary veins.   If she has severe or persistent chest pain that is worrisome to her, she should present to the emergency department.

## 2018-06-19 NOTE — Telephone Encounter (Signed)
Pt given verbal pre-testing instructions, written instructions mailed to the pt home. Pt verbalized understanding to test results and the instructions given. Pt is agreeable with plan of care.     Please arrive at the [facility] main entrance of [facility] TBD a scheduler will cal you to schedule (30-45 minutes prior to test start time)  Please come to our office a couple of days prior to your test to have your labs drawn   [facility] [place], [place] [address] [phone]  Proceed to the [facility] Radiology Department (First Floor).  Please follow these instructions carefully (unless otherwise directed):   On the Night Before the Test: . Be sure to Drink plenty of water. . Do not consume any caffeinated/decaffeinated beverages or chocolate 12 hours prior to your test. . Do not take any antihistamines 12 hours prior to your test. .   On the Day of the Test: . Drink plenty of water. Do not drink any water within one hour of the test. . Do not eat any food 4 hours prior to the test. . You may take your regular medications prior to the test.  . Take metoprolol (Lopressor) two hours prior to test.         After the Test: . Drink plenty of water. . After receiving IV contrast, you may experience a mild flushed feeling. This is normal. . On occasion, you may experience a mild rash up to 24 hours after the test. This is not dangerous. If this occurs, you can take Benadryl 25 mg and increase your fluid intake. . If you experience trouble breathing, this can be serious. If it is severe call 911 IMMEDIATELY. If it is mild, please call our office. . If you take any of these medications: Glipizide/Metformin, Avandament, Glucavance, please do not take 48 hours after completing test.

## 2018-06-21 ENCOUNTER — Encounter: Payer: Self-pay | Admitting: Physical Therapy

## 2018-06-25 ENCOUNTER — Encounter: Payer: Self-pay | Admitting: Family Medicine

## 2018-06-27 ENCOUNTER — Telehealth: Payer: Self-pay | Admitting: Internal Medicine

## 2018-06-27 NOTE — Telephone Encounter (Signed)
Follow Up:    Pt would like to know if her Monitor results are ready please?

## 2018-06-27 NOTE — Telephone Encounter (Signed)
Message fwd to MD to review and advise.

## 2018-06-28 ENCOUNTER — Ambulatory Visit: Payer: BLUE CROSS/BLUE SHIELD | Admitting: Psychology

## 2018-06-28 ENCOUNTER — Ambulatory Visit: Payer: Self-pay | Admitting: Psychology

## 2018-06-28 DIAGNOSIS — F411 Generalized anxiety disorder: Secondary | ICD-10-CM | POA: Diagnosis not present

## 2018-06-28 NOTE — Telephone Encounter (Signed)
Pt aware of holter monitor results with verbal understanding. Pt voiced appreciation for the call.

## 2018-06-28 NOTE — Telephone Encounter (Signed)
-----   Message from Elouise Munroe, MD sent at 06/27/2018  6:56 PM EDT ----- She did not have a significant number of early or skipped beats (PVCs). We will address this at follow up, but likely can be easily addressed with medications. No worrisome findings on holter monitor.

## 2018-07-05 ENCOUNTER — Ambulatory Visit: Payer: Self-pay | Admitting: Psychology

## 2018-07-09 ENCOUNTER — Ambulatory Visit: Payer: BLUE CROSS/BLUE SHIELD | Admitting: Psychology

## 2018-07-09 DIAGNOSIS — F411 Generalized anxiety disorder: Secondary | ICD-10-CM

## 2018-07-10 ENCOUNTER — Telehealth: Payer: Self-pay

## 2018-07-10 NOTE — Telephone Encounter (Signed)
Spoke with pt. Pt sts that her symptoms are stable. She is still having some intermittent chest discomfort and dizziness. We are awaiting additional testing (cor CTA). Adv pt that we can cancel her appt with Dr.Acharya on 11/14 and reschedule her after her CT is completed. Pt is agreeable. Adv her to contact if symptoms increase or worsen, pt verbalized understanding.

## 2018-07-12 ENCOUNTER — Encounter: Payer: Self-pay | Admitting: Family Medicine

## 2018-07-12 ENCOUNTER — Ambulatory Visit (INDEPENDENT_AMBULATORY_CARE_PROVIDER_SITE_OTHER): Payer: BLUE CROSS/BLUE SHIELD | Admitting: Family Medicine

## 2018-07-12 ENCOUNTER — Ambulatory Visit: Payer: BLUE CROSS/BLUE SHIELD | Admitting: Internal Medicine

## 2018-07-12 VITALS — BP 132/90 | HR 86 | Temp 98.9°F | Ht 63.0 in | Wt 171.7 lb

## 2018-07-12 DIAGNOSIS — F410 Panic disorder [episodic paroxysmal anxiety] without agoraphobia: Secondary | ICD-10-CM

## 2018-07-12 DIAGNOSIS — Z23 Encounter for immunization: Secondary | ICD-10-CM

## 2018-07-12 DIAGNOSIS — F411 Generalized anxiety disorder: Secondary | ICD-10-CM | POA: Diagnosis not present

## 2018-07-12 MED ORDER — ESCITALOPRAM OXALATE 10 MG PO TABS: 10.0000 mg | ORAL_TABLET | Freq: Every day | ORAL | 1 refills | Status: DC

## 2018-07-12 MED ORDER — LORAZEPAM 1 MG PO TABS: 1.0000 mg | ORAL_TABLET | Freq: Two times a day (BID) | ORAL | 0 refills | Status: DC | PRN

## 2018-07-12 NOTE — Progress Notes (Signed)
HPI:  Using dictation device. Unfortunately this device frequently misinterprets words/phrases.  Follow-up anxiety with panic disorder: -Seeing Dennison Bulla for counseling -Medications include Paxil 20 mg, started recently -She also is undergoing work-up with cardiology for palpitations -Per cardiology notes Holter monitor was not worrisome, no ischemia on stress echo, however is undergoing cardiac CT due to possible venous malformation, question left-sided superior vena cava. Reports was told this is not contributing to her symptoms. -Today reports: daily generalized anxiety, stress and nearly daily panic attacks. She does not think the Paxil is helping. She is seeing Dennison Bulla for CBT and does feel this is helpful. She recalls a similar falre of anxiety in the past and responded well to lexapro and a benzo. Reports she tried a small does of a xanax once recently for a panic attack and it really helped. No SI, thoughts of harm. Increase stress at work and with her husband's health.  ROS: See pertinent positives and negatives per HPI.  Past Medical History:  Diagnosis Date  . Anxiety   . Asthma   . Basal cell carcinoma of skin    Dr. Derrel Nip  . Fibrocystic breast changes 08/27/2014   No hx breast biopsy. No FH first degree relative with breast or ovarian ca. Getting yearly mammograms per prior PCP recs.     Past Surgical History:  Procedure Laterality Date  . LAPAROSCOPIC HYSTERECTOMY  2007   for cervical dysplasia    Family History  Problem Relation Age of Onset  . Lung cancer Father        smoker and worked in [employer]  . Asthma Mother     SOCIAL HX: see hpi   Current Outpatient Medications:  .  albuterol (PROVENTIL HFA;VENTOLIN HFA) 108 (90 Base) MCG/ACT inhaler, Inhale 2 puffs into the lungs 2 (two) times daily as needed., Disp: 3.7 g, Rfl: 6 .  Cholecalciferol (VITAMIN D PO), Take by mouth daily., Disp: , Rfl:  .  fluticasone (FLONASE) 50 MCG/ACT nasal spray,  Place into both nostrils daily., Disp: , Rfl:  .  loratadine (CLARITIN) 10 MG tablet, Take 10 mg by mouth daily., Disp: , Rfl:  .  metoprolol tartrate (LOPRESSOR) 100 MG tablet, Take 1 tablet 2 hours before your coronary CT, Disp: 1 tablet, Rfl: 0 .  tiZANidine (ZANAFLEX) 2 MG tablet, Before bed, or every 12 hours as needed for spasm, Disp: 30 tablet, Rfl: 0 .  escitalopram (LEXAPRO) 10 MG tablet, Take 1 tablet (10 mg total) by mouth daily., Disp: 30 tablet, Rfl: 1 .  LORazepam (ATIVAN) 1 MG tablet, Take 1 tablet (1 mg total) by mouth every 12 (twelve) hours as needed for anxiety., Disp: 10 tablet, Rfl: 0  EXAM:  Vitals:   07/12/18 1357  BP: 132/90  Pulse: 86  Temp: 98.9 F (37.2 C)    Body mass index is 30.42 kg/m.  GENERAL: vitals reviewed and listed above, alert, oriented, appears well hydrated and in no acute distress  HEENT: atraumatic, conjunttiva clear, no obvious abnormalities on inspection of external nose and ears  NECK: no obvious masses on inspection  LUNGS: clear to auscultation bilaterally, no wheezes, rales or rhonchi, good air movement  CV: HRRR, no peripheral edema  MS: moves all extremities without noticeable abnormality  PSYCH: pleasant and cooperative, no obvious depression or anxiety  ASSESSMENT AND PLAN:  Discussed the following assessment and plan:  Need for immunization against influenza - Plan: Flu Vaccine QUAD 6+ mos PF IM (Fluarix Quad PF)  Anxiety  state  Panic disorder  -discussed various treatment options and risk; explained in detail risks with benzos. -she has opted to continue CBT, wean off of Paxil, then try lexapro instead and use ativan low dose prn rarely for panic as needed. She has been informed that I do not prescribe controlled medications for regular use. -close follow up in 1 month, sooner as needed -consider referral to psychiatry if worsening or not improving -Patient advised to return or notify a doctor immediately if  symptoms worsen or persist or new concerns arise.  Patient Instructions  BEFORE YOU LEAVE: -Flu shot -follow up: 1 month  Start Paxil 10 mg once daily for 1 week.  Then stop.  Start Lexapro once daily in 1 week after you have stopped the Paxil.  Use the Ativan cautiously for panic attacks as needed.  Continue to see Dennison Bulla on a regular basis.   Eat a healthy diet and get regular aerobic exercise.  I hope you are feeling better soon! Seek care promptly if your symptoms worsen, new concerns arise or you are not improving with treatment.      Lucretia Kern, DO

## 2018-07-12 NOTE — Patient Instructions (Signed)
BEFORE YOU LEAVE: -Flu shot -follow up: 1 month  Start Paxil 10 mg once daily for 1 week.  Then stop.  Start Lexapro once daily in 1 week after you have stopped the Paxil.  Use the Ativan cautiously for panic attacks as needed.  Continue to see Dennison Bulla on a regular basis.   Eat a healthy diet and get regular aerobic exercise.  I hope you are feeling better soon! Seek care promptly if your symptoms worsen, new concerns arise or you are not improving with treatment.

## 2018-07-18 ENCOUNTER — Ambulatory Visit (HOSPITAL_BASED_OUTPATIENT_CLINIC_OR_DEPARTMENT_OTHER): Payer: BLUE CROSS/BLUE SHIELD | Attending: Internal Medicine | Admitting: Cardiovascular Disease

## 2018-07-18 DIAGNOSIS — G4719 Other hypersomnia: Secondary | ICD-10-CM

## 2018-07-18 DIAGNOSIS — R0683 Snoring: Secondary | ICD-10-CM

## 2018-07-18 DIAGNOSIS — Z79899 Other long term (current) drug therapy: Secondary | ICD-10-CM | POA: Diagnosis not present

## 2018-07-18 DIAGNOSIS — G4710 Hypersomnia, unspecified: Secondary | ICD-10-CM | POA: Diagnosis not present

## 2018-07-18 DIAGNOSIS — R002 Palpitations: Secondary | ICD-10-CM | POA: Diagnosis not present

## 2018-07-18 DIAGNOSIS — I491 Atrial premature depolarization: Secondary | ICD-10-CM | POA: Insufficient documentation

## 2018-07-18 DIAGNOSIS — I493 Ventricular premature depolarization: Secondary | ICD-10-CM | POA: Insufficient documentation

## 2018-07-18 DIAGNOSIS — G4761 Periodic limb movement disorder: Secondary | ICD-10-CM | POA: Diagnosis not present

## 2018-07-19 ENCOUNTER — Other Ambulatory Visit: Payer: Self-pay | Admitting: *Deleted

## 2018-07-19 DIAGNOSIS — Z01812 Encounter for preprocedural laboratory examination: Secondary | ICD-10-CM

## 2018-07-20 LAB — BASIC METABOLIC PANEL
BUN/Creatinine Ratio: 13 (ref 9–23)
BUN: 10 mg/dL (ref 6–24)
CO2: 23 mmol/L (ref 20–29)
Calcium: 9.3 mg/dL (ref 8.7–10.2)
Chloride: 102 mmol/L (ref 96–106)
Creatinine, Ser: 0.75 mg/dL (ref 0.57–1.00)
GFR calc Af Amer: 112 mL/min/{1.73_m2} (ref >59)
GFR calc non Af Amer: 97 mL/min/{1.73_m2} (ref >59)
Glucose: 74 mg/dL (ref 65–99)
Potassium: 4.6 mmol/L (ref 3.5–5.2)
Sodium: 140 mmol/L (ref 134–144)

## 2018-07-23 ENCOUNTER — Ambulatory Visit: Payer: BLUE CROSS/BLUE SHIELD | Admitting: Psychology

## 2018-07-23 ENCOUNTER — Encounter (HOSPITAL_COMMUNITY): Payer: Self-pay

## 2018-07-23 ENCOUNTER — Ambulatory Visit (HOSPITAL_COMMUNITY)
Admission: RE | Admit: 2018-07-23 | Discharge: 2018-07-23 | Disposition: A | Discharge status: Home / Self Care | Payer: BLUE CROSS/BLUE SHIELD | Source: Ambulatory Visit | Attending: Internal Medicine | Admitting: Internal Medicine

## 2018-07-23 DIAGNOSIS — R079 Chest pain, unspecified: Secondary | ICD-10-CM | POA: Diagnosis not present

## 2018-07-23 MED ORDER — NITROGLYCERIN 0.4 MG SL SUBL
SUBLINGUAL_TABLET | SUBLINGUAL | Status: AC
  Filled 2018-07-23: qty 2

## 2018-07-23 MED ORDER — NITROGLYCERIN 0.4 MG SL SUBL
0.8000 mg | SUBLINGUAL_TABLET | Freq: Once | SUBLINGUAL | Status: AC
  Administered 2018-07-23: 0.8 mg via SUBLINGUAL

## 2018-07-23 MED ORDER — IOPAMIDOL (ISOVUE-370) INJECTION 76%
80.0000 mL | Freq: Once | INTRAVENOUS | Status: AC | PRN
  Administered 2018-07-23: 80 mL via INTRAVENOUS

## 2018-07-24 ENCOUNTER — Telehealth: Payer: Self-pay

## 2018-07-24 NOTE — Telephone Encounter (Signed)
Spoke with pt. Pt aware of Cor CTA results with verbal understanding. Pt has her f/u appt scheduled on 12/11 Dr.Acharya. Pt sts that she is still having PVCs, the frequency has decreased since she has given up caffeine. Pt sts that she will keep that appt to discuss tx for the PVCs.

## 2018-07-24 NOTE — Telephone Encounter (Signed)
-----   Message from Elouise Munroe, MD sent at 07/24/2018 10:45 AM EST ----- No coronary artery disease to suggest a cause for chest pain.   There are two findings that we do not need to treat, and you have had since birth. 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 It is not causing problems and would not cause chest pain but we will follow echocardiograms occasionally to ensure that it is not having any affect on the heart.   There is also a small, inconsequential connection between your left anterior descending coronary artery and your pulmonary artery. While this is not a normal connection, it is also not causing any problems and should not be a source for chest pain. I have consulted with a colleague and we both agree that no action needs to be taken on incidental finding.

## 2018-07-28 ENCOUNTER — Encounter (HOSPITAL_BASED_OUTPATIENT_CLINIC_OR_DEPARTMENT_OTHER): Payer: Self-pay | Admitting: Cardiovascular Disease

## 2018-07-28 NOTE — Procedures (Signed)
Patient Name: Alexandra Welch, Alexandra Welch Date: [birth year] Gender: Female D.O.B: 1976-08-28 Age (years): 5 Referring Provider: Cherlynn Kaiser MD Height (inches): 63 Interpreting Physician: Shelva Majestic MD, ABSM Weight (lbs): 158 RPSGT: Jonna Coup BMI: 28 MRN: [id number] Neck Size: 13.50  CLINICAL INFORMATION Sleep Study Type: NPSG  Indication for sleep study: Snoring; daytime sleepiness  Epworth Sleepiness Score: 16  SLEEP STUDY TECHNIQUE As per the AASM Manual for the Scoring of Sleep and Associated Events v2.3 (April 2016) with a hypopnea requiring 4% desaturations.  The channels recorded and monitored were frontal, central and occipital EEG, electrooculogram (EOG), submentalis EMG (chin), nasal and oral airflow, thoracic and abdominal wall motion, anterior tibialis EMG, snore microphone, electrocardiogram, and pulse oximetry.  MEDICATIONS     albuterol (PROVENTIL HFA;VENTOLIN HFA) 108 (90 Base) MCG/ACT inhaler         Cholecalciferol (VITAMIN D PO)         escitalopram (LEXAPRO) 10 MG tablet         fluticasone (FLONASE) 50 MCG/ACT nasal spray         loratadine (CLARITIN) 10 MG tablet         LORazepam (ATIVAN) 1 MG tablet         metoprolol tartrate (LOPRESSOR) 100 MG tablet         tiZANidine (ZANAFLEX) 2 MG tablet      Medications self-administered by patient taken the night of the study : N/A  SLEEP ARCHITECTURE The study was initiated at 10:03:45 PM and ended at 4:39:02 AM.  Sleep onset time was 20.1 minutes and the sleep efficiency was 79.9%%. The total sleep time was 316 minutes.  Stage REM latency was 95.0 minutes.  The patient spent 6.8%% of the night in stage N1 sleep, 72.8%% in stage N2 sleep, 0.5%% in stage N3 and 19.9% in REM.  Alpha intrusion was absent.  Supine sleep was 28.38%.  RESPIRATORY PARAMETERS The overall apnea/hypopnea index (AHI) was 0.0 per hour. There were 0 total apneas, including 0 obstructive, 0 central and 0 mixed  apneas. There were 0 hypopneas and 0 RERAs.  The AHI during Stage REM sleep was 0.0 per hour.  AHI while supine was 0.0 per hour.  The mean oxygen saturation was 95.9%. The minimum SpO2 during sleep was 92.0%.  Soft snoring was noted during this study.  CARDIAC DATA The 2 lead EKG demonstrated sinus rhythm. The mean heart rate was 67.9 beats per minute. Other EKG findings include: PVCs.  LEG MOVEMENT DATA The total PLMS were 538 with a resulting PLMS index of 101.8. Associated arousal with leg movement index was 5.3. PLM series count was 0 and index 0.  IMPRESSIONS - No significant obstructive sleep apnea occurred during this study (AHI = 0.0/h). - No significant central sleep apnea occurred during this study (CAI = 0.0/h). - The patient had no oxygen desaturation with an oxygen nadir of 92%. - The patient snored with soft snoring volume. - EKG findings include rare PACs and PVCs. - Significant periodic limb movements occured during sleep. Associated arousals were significant.  DIAGNOSIS - Periodic Limb Movement During Sleep (327.51 [G47.61 ICD-10]) - Excessive Daytime Sleepiness  RECOMMENDATIONS - There is no indication for CPAP therapy.  - In this patient with significant sleepiness and an Epworth Sleepiness Score of 16, consider a multiple latency sleep test (MLST) to assesss for idiopathic hypersomnolence or narcolepsy. - If patient is symptomatic with restless legs considetr a trial of pharmacotherapy for treatment of Periodic Leg Movements of  Sleep. - Avoid alcohol, sedatives and other CNS depressants that may worsen sleep apnea and disrupt normal sleep architecture. - Sleep hygiene should be reviewed to assess factors that may improve sleep quality. - Weight management and regular exercise should be initiated or continued if appropriate.  [Electronically signed] 07/28/2018 02:13 PM  Shelva Majestic MD, [facility], [place], American Board of Sleep Medicine   NPI:  [id number] PH: [phone]   FX: [phone] [place]

## 2018-07-31 ENCOUNTER — Ambulatory Visit: Payer: BLUE CROSS/BLUE SHIELD | Admitting: Psychology

## 2018-07-31 DIAGNOSIS — F411 Generalized anxiety disorder: Secondary | ICD-10-CM | POA: Diagnosis not present

## 2018-08-01 ENCOUNTER — Telehealth: Payer: Self-pay | Admitting: *Deleted

## 2018-08-01 ENCOUNTER — Other Ambulatory Visit: Payer: Self-pay | Admitting: Cardiovascular Disease

## 2018-08-01 DIAGNOSIS — R400 Somnolence: Secondary | ICD-10-CM

## 2018-08-01 NOTE — Telephone Encounter (Signed)
-----   Message from Troy Sine, MD sent at 07/28/2018  2:18 PM EST ----- Mariann Laster please notify pt of result; no OSA; consider MLST to evaluate for idiopathic hypersomnolence or narcolepsy with significant sleepiness

## 2018-08-01 NOTE — Telephone Encounter (Signed)
Patient informed of sleep study results and recommendations. She agrees to proceed with MLST.

## 2018-08-02 ENCOUNTER — Telehealth: Payer: Self-pay | Admitting: *Deleted

## 2018-08-02 NOTE — Telephone Encounter (Signed)
-----   Message from Lauralee Evener, [place] sent at 08/01/2018  4:38 PM EST ----- MLST /NPSG

## 2018-08-02 NOTE — Telephone Encounter (Signed)
Left message to return a call to get MLST /NSGP appointment details.

## 2018-08-06 ENCOUNTER — Encounter: Payer: Self-pay | Admitting: *Deleted

## 2018-08-06 NOTE — Telephone Encounter (Signed)
Called to inform of MLST/NSPG appointment. Mail box full. Unable to leave message. Letter will be sent.

## 2018-08-08 ENCOUNTER — Ambulatory Visit (INDEPENDENT_AMBULATORY_CARE_PROVIDER_SITE_OTHER): Payer: BLUE CROSS/BLUE SHIELD | Admitting: Internal Medicine

## 2018-08-08 ENCOUNTER — Encounter: Payer: Self-pay | Admitting: Internal Medicine

## 2018-08-08 VITALS — BP 144/97 | HR 106 | Ht 63.0 in | Wt 172.4 lb

## 2018-08-08 DIAGNOSIS — R0683 Snoring: Secondary | ICD-10-CM

## 2018-08-08 DIAGNOSIS — I493 Ventricular premature depolarization: Secondary | ICD-10-CM

## 2018-08-08 DIAGNOSIS — R079 Chest pain, unspecified: Secondary | ICD-10-CM | POA: Diagnosis not present

## 2018-08-08 DIAGNOSIS — R002 Palpitations: Secondary | ICD-10-CM

## 2018-08-08 MED ORDER — METOPROLOL SUCCINATE ER 50 MG PO TB24: 50.0000 mg | ORAL_TABLET | Freq: Every day | ORAL | 3 refills | Status: DC

## 2018-08-08 NOTE — Patient Instructions (Addendum)
Medication Instructions:  Your physician has recommended you make the following change in your medication:  START Metoprolol Succinate 50mg  daily. An Rx has been sent to your pharmacy.  If you need a refill on your cardiac medications before your next appointment, please call your pharmacy.   Lab work: None ordered If you have labs (blood work) drawn today and your tests are completely normal, you will receive your results only by: Marland Kitchen MyChart Message (if you have MyChart) OR . A paper copy in the mail If you have any lab test that is abnormal or we need to change your treatment, we will call you to review the results.  Testing/Procedures: None ordered  Follow-Up: At [facility], you and your health needs are our priority.  As part of our continuing mission to provide you with exceptional heart care, we have created designated Provider Care Teams.  These Care Teams include your primary Cardiologist (physician) and Advanced Practice Providers (APPs -  Physician Assistants and Nurse Practitioners) who all work together to provide you with the care you need, when you need it. You will need a follow up appointment as needed.    Any Other Special Instructions Will Be Listed Below (If Applicable).

## 2018-08-11 ENCOUNTER — Telehealth: Payer: Self-pay | Admitting: Cardiology

## 2018-08-11 ENCOUNTER — Other Ambulatory Visit: Payer: Self-pay | Admitting: Cardiology

## 2018-08-11 MED ORDER — METOPROLOL SUCCINATE ER 25 MG PO TB24: 25.0000 mg | ORAL_TABLET | Freq: Every day | ORAL | 11 refills | Status: DC

## 2018-08-11 NOTE — Telephone Encounter (Signed)
Received outpatient page from Ms. Cates-Storck regarding feeling dizzy after the start of her metoprolol XL 50 mg daily.  I have instructed her to decrease this dose to 25 mg daily.  If her symptoms persist or worsen she is to call us back for an update otherwise she is to speak with her primary cardiologist in approximately 1 week to update her, Dr. Margaretann Loveless.  I will let her know of the updated medication adjustment.  Patient denies chest pain, shortness of breath, palpitations, nausea, vomiting or syncope.   Kathyrn Drown NP-C [place] Pager: [phone]

## 2018-08-14 ENCOUNTER — Ambulatory Visit (INDEPENDENT_AMBULATORY_CARE_PROVIDER_SITE_OTHER): Payer: BLUE CROSS/BLUE SHIELD | Admitting: Family Medicine

## 2018-08-14 ENCOUNTER — Encounter: Payer: Self-pay | Admitting: Family Medicine

## 2018-08-14 VITALS — BP 130/90 | HR 76 | Temp 98.3°F | Ht 63.0 in | Wt 170.4 lb

## 2018-08-14 DIAGNOSIS — I10 Essential (primary) hypertension: Secondary | ICD-10-CM | POA: Diagnosis not present

## 2018-08-14 DIAGNOSIS — F411 Generalized anxiety disorder: Secondary | ICD-10-CM

## 2018-08-14 DIAGNOSIS — R002 Palpitations: Secondary | ICD-10-CM | POA: Diagnosis not present

## 2018-08-14 NOTE — Progress Notes (Signed)
HPI:  Using dictation device. Unfortunately this device frequently misinterprets words/phrases.  Alexandra Welch is a very pleasant 42 yo here for follow up. PMH elevated hr, palpitations and anxciety. Still with palpitations and dizziness at times with this and working with her cardiologist.. They are adjusting her metoprolol. She felt to tired on 50mg  so stopped it the last 3 days. Cardiologist called in lower dose she plans to start. Reports blood pressure is fine at home. Seeing Lanny Hurst for counseling and feels this and lexapro are helping with the anxiety. Much fewer panic attacks and sleeping better. Has no needed the Ativan.    ROS: See pertinent positives and negatives per HPI.  Past Medical History:  Diagnosis Date  . Anxiety   . Asthma   . Basal cell carcinoma of skin    Dr. Derrel Nip  . Fibrocystic breast changes 08/27/2014   No hx breast biopsy. No FH first degree relative with breast or ovarian ca. Getting yearly mammograms per prior PCP recs.     Past Surgical History:  Procedure Laterality Date  . LAPAROSCOPIC HYSTERECTOMY  2007   for cervical dysplasia    Family History  Problem Relation Age of Onset  . Lung cancer Father        smoker and worked in [employer]  . Asthma Mother     SOCIAL HX: see hpi   Current Outpatient Medications:  .  albuterol (PROVENTIL HFA;VENTOLIN HFA) 108 (90 Base) MCG/ACT inhaler, Inhale 2 puffs into the lungs 2 (two) times daily as needed., Disp: 3.7 g, Rfl: 6 .  Cholecalciferol (VITAMIN D PO), Take by mouth daily., Disp: , Rfl:  .  escitalopram (LEXAPRO) 10 MG tablet, Take 1 tablet (10 mg total) by mouth daily., Disp: 30 tablet, Rfl: 1 .  fluticasone (FLONASE) 50 MCG/ACT nasal spray, Place into both nostrils daily., Disp: , Rfl:  .  loratadine (CLARITIN) 10 MG tablet, Take 10 mg by mouth daily., Disp: , Rfl:  .  LORazepam (ATIVAN) 1 MG tablet, Take 1 tablet (1 mg total) by mouth every 12 (twelve) hours as needed for anxiety.,  Disp: 10 tablet, Rfl: 0 .  metoprolol succinate (TOPROL XL) 25 MG 24 hr tablet, Take 1 tablet (25 mg total) by mouth daily., Disp: 30 tablet, Rfl: 11  EXAM:  Vitals:   08/14/18 1604  BP: 130/90  Pulse: 76  Temp: 98.3 F (36.8 C)  SpO2: 96%    Body mass index is 30.19 kg/m.  GENERAL: vitals reviewed and listed above, alert, oriented, appears well hydrated and in no acute distress  HEENT: atraumatic, conjunttiva clear, no obvious abnormalities on inspection of external nose and ears  NECK: no obvious masses on inspection  LUNGS: clear to auscultation bilaterally, no wheezes, rales or rhonchi, good air movement  CV: HRRR, no peripheral edema  MS: moves all extremities without noticeable abnormality  PSYCH: pleasant and cooperative, no obvious depression or anxiety  ASSESSMENT AND PLAN:  Discussed the following assessment and plan:  Anxiety state -she feels is doing better - continue lexapro and CBT -has not needed the ativan and aware of risks -follow up every 3-4 months and as needed  Hypertension, unspecified type Palpitations -seeing cardiology -she will try starting the lower dose of metoprolol and call her cardiolgist if not able to tolerate - there are many other BBs and a lower dose of this she could try if needed -follow up with cardiology or here for recheck B in 1-2 months on treatment -advised if  dizziness persists despite tx palpitations, BP and anxiety then should see neurology for eval - she declined for now but agrees to let me know if persists  -Patient advised to return or notify a doctor immediately if symptoms worsen or persist or new concerns arise.  Patient Instructions  Follow up 1-2 months  Continue metoprolol and follow up with your cardiologist if not able to tolerate.  Continue the lexapro and the counseling.  Follow up sooner if any concerns.      Lucretia Kern, DO

## 2018-08-14 NOTE — Patient Instructions (Signed)
Follow up 1-2 months  Continue metoprolol and follow up with your cardiologist if not able to tolerate.  Continue the lexapro and the counseling.  Follow up sooner if any concerns.

## 2018-08-16 ENCOUNTER — Ambulatory Visit: Payer: BLUE CROSS/BLUE SHIELD | Admitting: Psychology

## 2018-08-16 DIAGNOSIS — F411 Generalized anxiety disorder: Secondary | ICD-10-CM | POA: Diagnosis not present

## 2018-08-19 NOTE — Progress Notes (Signed)
Cardiology Office Note:    Date:  44/31/5400   ID:  Alexandra Welch, Alexandra Welch October 31, 1975, MRN [id number]  PCP:  Lucretia Kern, DO  Cardiologist:  No primary care provider on file.  Electrophysiologist:  None   Referring MD: Lucretia Kern, DO   Follow up chest pain and palpitations.  History of Present Illness:    Alexandra Welch is a 42 y.o. female with a hx of anxiety, asthma, chest pain, and palpitations who presents today for follow-up of testing.  We obtained a CT coronary angiogram to rule out obstructive coronary disease or anomalous coronary arteries.  She has no hemodynamically significant disease.  There is a small caliber branch off of the mid LAD that has a fistulous connection to the main pulmonary artery is no clinical significance currently.  She also has a persistent left SVC with a dilated coronary sinus.  No anomalous pulmonary venous drainage is appreciated.  Normal biventricular chamber size.  We discussed these results, and she is reassured that they are of no significance at this time.  I did inform her that if she plans any procedures or requires central venous catheters, she should inform her physicians of her persistent left SVC.  She has PVCs on her monitor, and this may be the contributor to her palpitations.  She did not record any diary events during her monitor.  Of note she tells me that the day she took metoprolol tartrate 100 mg for the CT coronary angiogram, she felt that was the best day she had had symptom wise.  She did have dizziness for approximately an hour after taking the medication, but felt quite well afterwards.  The patient denies PND, orthopnea, or leg swelling. Denies syncope or presyncope.  Past Medical History:  Diagnosis Date  . Anxiety   . Asthma   . Basal cell carcinoma of skin    Dr. Derrel Nip  . Fibrocystic breast changes 08/27/2014   No hx breast biopsy. No FH first degree relative with breast or ovarian ca. Getting yearly  mammograms per prior PCP recs.     Past Surgical History:  Procedure Laterality Date  . LAPAROSCOPIC HYSTERECTOMY  2007   for cervical dysplasia    Current Medications: Current Meds  Medication Sig  . Cholecalciferol (VITAMIN D PO) Take by mouth daily.  Marland Kitchen escitalopram (LEXAPRO) 10 MG tablet Take 1 tablet (10 mg total) by mouth daily.  Marland Kitchen loratadine (CLARITIN) 10 MG tablet Take 10 mg by mouth daily.     Allergies:   Shellfish allergy   Social History   Socioeconomic History  . Marital status: Married    Spouse name: Not on file  . Number of children: Not on file  . Years of education: Not on file  . Highest education level: Not on file  Occupational History  . Not on file  Social Needs  . Financial resource strain: Not on file  . Food insecurity:    Worry: Not on file    Inability: Not on file  . Transportation needs:    Medical: Not on file    Non-medical: Not on file  Tobacco Use  . Smoking status: Never Smoker  . Smokeless tobacco: Never Used  Substance and Sexual Activity  . Alcohol use: Yes    Alcohol/week: 1.0 - 2.0 standard drinks    Types: 1 - 2 Standard drinks or equivalent per week    Comment: occ- 1-2 times per month  . Drug use: No  .  Sexual activity: Yes    Partners: Male    Birth control/protection: Surgical    Comment: hysterectomy  Lifestyle  . Physical activity:    Days per week: Not on file    Minutes per session: Not on file  . Stress: Not on file  Relationships  . Social connections:    Talks on phone: Not on file    Gets together: Not on file    Attends religious service: Not on file    Active member of club or organization: Not on file    Attends meetings of clubs or organizations: Not on file    Relationship status: Not on file  Other Topics Concern  . Not on file  Social History Narrative   Work or School: Scientist, clinical (histocompatibility and immunogenetics) [employer]      Home Situation: lives with husband and two older children      Spiritual Beliefs:  Baptist      Lifestyle: exercise is sporadic; diet is ok        Family History: The patient's family history includes Asthma in her mother; Lung cancer in her father.  ROS:   Please see the history of present illness.    All other systems reviewed and are negative.  EKGs/Labs/Other Studies Reviewed:    The following studies were reviewed today:  EKG: Not performed  Recent Labs: 07/19/2018: BUN 10; Creatinine, Ser 0.75; Potassium 4.6; Sodium 140  Recent Lipid Panel    Component Value Date/Time   CHOL 223 (H) 12/21/2015 0951   TRIG 67 12/21/2015 0951   HDL 83 12/21/2015 0951   CHOLHDL 2.7 12/21/2015 0951   VLDL 13 12/21/2015 0951   LDLCALC 127 12/21/2015 0951   LDLDIRECT 136.5 08/12/2013 0857    Physical Exam:    VS:  BP (!) 144/97   Pulse (!) 106   Ht 5\' 3"  (1.6 m)   Wt 172 lb 6.4 oz (78.2 kg)   LMP 08/29/2005 (Approximate)   BMI 30.54 kg/m     Wt Readings from Last 3 Encounters:  08/14/18 170 lb 6.4 oz (77.3 kg)  08/08/18 172 lb 6.4 oz (78.2 kg)  07/12/18 171 lb 11.2 oz (77.9 kg)    Constitutional: No acute distress Eyes: pupils equally round and reactive to light, sclera non-icteric, normal conjunctiva and lids ENMT: normal dentition, moist mucous membranes Cardiovascular: regular rhythm, normal rate, no murmurs. S1 and S2 normal. Radial pulses normal bilaterally. No jugular venous distention.  Respiratory: clear to auscultation bilaterally GI : normal bowel sounds, soft and nontender. No distention.   MSK: extremities warm, well perfused. No edema.  NEURO: grossly nonfocal exam, moves all extremities. PSYCH: alert and oriented x 3, normal mood and affect.   ASSESSMENT:    1. Palpitations   2. Chest pain, unspecified type   3. Snoring   4. PVC (premature ventricular contraction)    PLAN:    I am pleased to hear that she felt well after receiving metoprolol.  Because she had dizziness with 100 mg, we will start with 50 mg and for ease of dosing we  will start with metoprolol succinate.  She should contact us if she feels that her palpitations are either not being controlled or that she is significantly dizzy from the dose of 25 mg daily.  We will be in contact by phone, and she would like to follow-up as needed.  She continues to support and care for her husband who is medically ill, however she remains in good spirits.  I would be happy to see the patient back in follow-up at any time.   Medication Adjustments/Labs and Tests Ordered: Current medicines are reviewed at length with the patient today.  Concerns regarding medicines are outlined above.  No orders of the defined types were placed in this encounter.  Meds ordered this encounter  Medications  . DISCONTD: metoprolol succinate (TOPROL-XL) 50 MG 24 hr tablet    Sig: Take 1 tablet (50 mg total) by mouth daily. Take with or immediately following a meal.    Dispense:  90 tablet    Refill:  3    Patient Instructions  Medication Instructions:  Your physician has recommended you make the following change in your medication:  START Metoprolol Succinate 50mg  daily. An Rx has been sent to your pharmacy.  If you need a refill on your cardiac medications before your next appointment, please call your pharmacy.   Lab work: None ordered If you have labs (blood work) drawn today and your tests are completely normal, you will receive your results only by: Marland Kitchen MyChart Message (if you have MyChart) OR . A paper copy in the mail If you have any lab test that is abnormal or we need to change your treatment, we will call you to review the results.  Testing/Procedures: None ordered  Follow-Up: At [facility], you and your health needs are our priority.  As part of our continuing mission to provide you with exceptional heart care, we have created designated Provider Care Teams.  These Care Teams include your primary Cardiologist (physician) and Advanced Practice Providers (APPs -  Physician  Assistants and Nurse Practitioners) who all work together to provide you with the care you need, when you need it. You will need a follow up appointment as needed.    Any Other Special Instructions Will Be Listed Below (If Applicable).       Signed, Elouise Munroe, MD  08/20/2018 6:53 AM    [place]

## 2018-08-30 ENCOUNTER — Ambulatory Visit (INDEPENDENT_AMBULATORY_CARE_PROVIDER_SITE_OTHER): Payer: BLUE CROSS/BLUE SHIELD | Admitting: Psychology

## 2018-08-30 ENCOUNTER — Ambulatory Visit: Payer: BLUE CROSS/BLUE SHIELD | Admitting: Psychology

## 2018-08-30 DIAGNOSIS — F411 Generalized anxiety disorder: Secondary | ICD-10-CM

## 2018-09-03 ENCOUNTER — Encounter (HOSPITAL_BASED_OUTPATIENT_CLINIC_OR_DEPARTMENT_OTHER): Payer: Self-pay

## 2018-09-04 ENCOUNTER — Encounter (HOSPITAL_BASED_OUTPATIENT_CLINIC_OR_DEPARTMENT_OTHER): Payer: Self-pay

## 2018-09-13 ENCOUNTER — Ambulatory Visit (INDEPENDENT_AMBULATORY_CARE_PROVIDER_SITE_OTHER): Payer: BLUE CROSS/BLUE SHIELD | Admitting: Psychology

## 2018-09-13 DIAGNOSIS — F411 Generalized anxiety disorder: Secondary | ICD-10-CM

## 2018-09-20 DIAGNOSIS — Z5111 Encounter for antineoplastic chemotherapy: Secondary | ICD-10-CM | POA: Diagnosis not present

## 2018-09-20 DIAGNOSIS — D481 Neoplasm of uncertain behavior of connective and other soft tissue: Secondary | ICD-10-CM | POA: Diagnosis not present

## 2018-09-21 DIAGNOSIS — D481 Neoplasm of uncertain behavior of connective and other soft tissue: Secondary | ICD-10-CM | POA: Diagnosis not present

## 2018-09-25 ENCOUNTER — Ambulatory Visit: Payer: Self-pay | Admitting: Family Medicine

## 2018-10-02 ENCOUNTER — Ambulatory Visit (INDEPENDENT_AMBULATORY_CARE_PROVIDER_SITE_OTHER): Payer: BLUE CROSS/BLUE SHIELD | Admitting: Psychology

## 2018-10-02 DIAGNOSIS — F411 Generalized anxiety disorder: Secondary | ICD-10-CM | POA: Diagnosis not present

## 2018-10-15 ENCOUNTER — Ambulatory Visit (INDEPENDENT_AMBULATORY_CARE_PROVIDER_SITE_OTHER): Payer: BLUE CROSS/BLUE SHIELD | Admitting: Psychiatry

## 2018-10-15 ENCOUNTER — Encounter: Payer: Self-pay | Admitting: Psychiatry

## 2018-10-15 VITALS — BP 179/103 | HR 103 | Ht 63.0 in | Wt 160.0 lb

## 2018-10-15 DIAGNOSIS — F5105 Insomnia due to other mental disorder: Secondary | ICD-10-CM

## 2018-10-15 DIAGNOSIS — F4001 Agoraphobia with panic disorder: Secondary | ICD-10-CM | POA: Diagnosis not present

## 2018-10-15 DIAGNOSIS — F411 Generalized anxiety disorder: Secondary | ICD-10-CM

## 2018-10-15 MED ORDER — TRAZODONE HCL 50 MG PO TABS: 50.0000 mg | ORAL_TABLET | Freq: Every evening | ORAL | 1 refills | Status: DC | PRN

## 2018-10-15 MED ORDER — ALPRAZOLAM 0.5 MG PO TABS: 0.5000 mg | ORAL_TABLET | Freq: Three times a day (TID) | ORAL | 1 refills | Status: DC | PRN

## 2018-10-15 MED ORDER — SERTRALINE HCL 100 MG PO TABS: 100.0000 mg | ORAL_TABLET | Freq: Every day | ORAL | 1 refills | Status: DC

## 2018-10-15 NOTE — Patient Instructions (Signed)
Reduce reduce this Lexapro to 1/2 tablet and add one half of the 100 mg sertraline tablet for 1 week. Then stop Lexapro and increase sertraline to 1 daily
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
## 2018-10-23 ENCOUNTER — Ambulatory Visit (INDEPENDENT_AMBULATORY_CARE_PROVIDER_SITE_OTHER): Payer: BLUE CROSS/BLUE SHIELD | Admitting: Psychology

## 2018-10-23 DIAGNOSIS — F411 Generalized anxiety disorder: Secondary | ICD-10-CM | POA: Diagnosis not present

## 2018-11-15 ENCOUNTER — Other Ambulatory Visit: Payer: Self-pay

## 2018-11-15 ENCOUNTER — Ambulatory Visit (INDEPENDENT_AMBULATORY_CARE_PROVIDER_SITE_OTHER): Payer: BLUE CROSS/BLUE SHIELD | Admitting: Psychology

## 2018-11-15 DIAGNOSIS — F411 Generalized anxiety disorder: Secondary | ICD-10-CM

## 2018-11-26 ENCOUNTER — Encounter: Payer: Self-pay | Admitting: Psychiatry

## 2018-11-26 ENCOUNTER — Ambulatory Visit: Payer: BLUE CROSS/BLUE SHIELD | Admitting: Psychiatry

## 2018-11-26 ENCOUNTER — Other Ambulatory Visit: Payer: Self-pay

## 2018-11-26 DIAGNOSIS — F4001 Agoraphobia with panic disorder: Secondary | ICD-10-CM

## 2018-11-26 DIAGNOSIS — F5105 Insomnia due to other mental disorder: Secondary | ICD-10-CM | POA: Diagnosis not present

## 2018-11-26 DIAGNOSIS — F411 Generalized anxiety disorder: Secondary | ICD-10-CM

## 2018-11-26 MED ORDER — SERTRALINE HCL 100 MG PO TABS: 100.0000 mg | ORAL_TABLET | Freq: Every day | ORAL | 0 refills | Status: DC

## 2018-11-26 NOTE — Progress Notes (Signed)
Alexandra Welch [id number] 12-26-1975 43 y.o.  Subjective:   Patient ID:  Alexandra Welch is a 43 y.o. (DOB March 02, 1976) female.  Chief Complaint:  Chief Complaint  Patient presents with  . Anxiety  . Sleeping Problem  . Follow-up    Initiating medication    HPI Alexandra Welch presents to the office today for follow-up of panic disorder, generalized anxiety disorder, and insomnia.  She was first seen on October 15, 2018.  Her anxiety was not well managed on Lexapro we did a cross taper from Lexapro over to sertraline 100 mg over 1 week.  We prescribed trazodone for insomnia.  Because of the severity of her anxiety and panic we also prescribed Xanax 0.25 to 0.5 mg 3 times a day as needed because we knew that she would not get benefit right away from the sertraline.  I do feel a little bit better and sleeping better.  Only 1 trazodone. Mild hangover.  Taking Xanax only if super anxious day.  Average less than 1 day.  Can still have nocturnal panic but less.  Last panic nocturnal 2 weeks ago.   Anxiety 4-5/10 good.  Worries over H getting sicker bc he was.  Patient reports stable mood and denies depressed or irritable moods.   Patient denies difficulty with sleep initiation or maintenance. Denies appetite disturbance.  Patient reports that energy and motivation have been good.  Patient denies any difficulty with concentration.  Patient denies any suicidal ideation.  Past Psychiatric Medication Trials: Ativan tiredness, Lexapro, paxil years agoSE    Review of Systems:  Review of Systems  Neurological: Negative for tremors and weakness.    Medications: I have reviewed the patient's current medications.  Current Outpatient Medications  Medication Sig Dispense Refill  . albuterol (PROVENTIL HFA;VENTOLIN HFA) 108 (90 Base) MCG/ACT inhaler Inhale 2 puffs into the lungs 2 (two) times daily as needed. 3.7 g 6  . ALPRAZolam (XANAX) 0.5 MG tablet Take 1 tablet (0.5 mg total) by mouth  3 (three) times daily as needed for anxiety. 60 tablet 1  . Cholecalciferol (VITAMIN D PO) Take by mouth daily.    . fluticasone (FLONASE) 50 MCG/ACT nasal spray Place into both nostrils daily.    Marland Kitchen loratadine (CLARITIN) 10 MG tablet Take 10 mg by mouth daily.    . metoprolol succinate (TOPROL XL) 25 MG 24 hr tablet Take 1 tablet (25 mg total) by mouth daily. (Patient not taking: Reported on 10/15/2018) 30 tablet 11  . sertraline (ZOLOFT) 100 MG tablet Take 1 tablet (100 mg total) by mouth daily. 90 tablet 0  . traZODone (DESYREL) 50 MG tablet Take 1-2 tablets (50-100 mg total) by mouth at bedtime as needed for sleep. 60 tablet 1   No current facility-administered medications for this visit.     Medication Side Effects: None  Allergies:  Allergies  Allergen Reactions  . Shellfish Allergy Swelling    Past Medical History:  Diagnosis Date  . Anxiety   . Asthma   . Basal cell carcinoma of skin    Dr. Derrel Nip  . Fibrocystic breast changes 08/27/2014   No hx breast biopsy. No FH first degree relative with breast or ovarian ca. Getting yearly mammograms per prior PCP recs.   Marland Kitchen PVC (premature ventricular contraction)    negative cardiac work up    Family History  Problem Relation Age of Onset  . Lung cancer Father        smoker and worked in [employer]  .  Asthma Mother   . Anxiety disorder Mother   . Post-traumatic stress disorder Mother   . Anxiety disorder Sister   . Obsessive Compulsive Disorder Son   . Anxiety disorder Sister   . Suicidality Brother        gambling died by suicide    Social History   Socioeconomic History  . Marital status: Married    Spouse name: Pilar Plate  . Number of children: 2  . Years of education: Not on file  . Highest education level: Not on file  Occupational History  . Not on file  Social Needs  . Financial resource strain: Somewhat hard  . Food insecurity:    Worry: Not on file    Inability: Not on file  . Transportation needs:     Medical: Not on file    Non-medical: Not on file  Tobacco Use  . Smoking status: Never Smoker  . Smokeless tobacco: Never Used  Substance and Sexual Activity  . Alcohol use: Yes    Alcohol/week: 1.0 - 2.0 standard drinks    Types: 1 - 2 Standard drinks or equivalent per week    Comment: occ- 1-2 times per month  . Drug use: No  . Sexual activity: Yes    Partners: Male    Birth control/protection: Surgical    Comment: hysterectomy  Lifestyle  . Physical activity:    Days per week: Not on file    Minutes per session: Not on file  . Stress: Not on file  Relationships  . Social connections:    Talks on phone: Not on file    Gets together: Not on file    Attends religious service: Not on file    Active member of club or organization: Not on file    Attends meetings of clubs or organizations: Not on file    Relationship status: Not on file  . Intimate partner violence:    Fear of current or ex partner: Not on file    Emotionally abused: Not on file    Physically abused: Not on file    Forced sexual activity: Not on file  Other Topics Concern  . Not on file  Social History Narrative   Work or School: Scientist, clinical (histocompatibility and immunogenetics) [employer]      Home Situation: lives with husband and two older children      Spiritual Beliefs: Baptist      Lifestyle: exercise is sporadic; diet is ok       Past Medical History, Surgical history, Social history, and Family history were reviewed and updated as appropriate.   Please see review of systems for further details on the patient's review from today.   Objective:   Physical Exam:  LMP 08/29/2005 (Approximate)   Physical Exam Neurological:     Mental Status: She is alert and oriented to person, place, and time.     Cranial Nerves: No dysarthria.  Psychiatric:        Attention and Perception: She is attentive. She does not perceive auditory or visual hallucinations.        Mood and Affect: Mood is anxious. Mood is not depressed. Affect  is not tearful.        Speech: Speech is not rapid and pressured or slurred.        Behavior: Behavior is cooperative.        Thought Content: Thought content is not paranoid. Thought content does not include homicidal or suicidal ideation.  Cognition and Memory: Cognition normal.        Judgment: Judgment normal.     Comments: Good insight     Lab Review:     Component Value Date/Time   NA 140 07/19/2018 1516   K 4.6 07/19/2018 1516   CL 102 07/19/2018 1516   CO2 23 07/19/2018 1516   GLUCOSE 74 07/19/2018 1516   GLUCOSE 78 11/29/2016 1413   BUN 10 07/19/2018 1516   CREATININE 0.75 07/19/2018 1516   CREATININE 0.80 11/29/2016 1413   CALCIUM 9.3 07/19/2018 1516   PROT 7.4 11/29/2016 1413   ALBUMIN 4.4 11/29/2016 1413   AST 16 11/29/2016 1413   ALT 12 11/29/2016 1413   ALKPHOS 35 11/29/2016 1413   BILITOT 0.7 11/29/2016 1413   GFRNONAA 97 07/19/2018 1516   GFRAA 112 07/19/2018 1516       Component Value Date/Time   WBC 6.2 08/12/2013 0857   RBC 4.53 08/12/2013 0857   HGB 13.5 08/12/2013 0857   HCT 39.5 08/12/2013 0857   PLT 325.0 08/12/2013 0857   MCV 87.3 08/12/2013 0857   MCHC 34.1 08/12/2013 0857   RDW 13.1 08/12/2013 0857   LYMPHSABS 1.9 08/12/2013 0857   MONOABS 0.5 08/12/2013 0857   EOSABS 0.3 08/12/2013 0857   BASOSABS 0.0 08/12/2013 0857    No results found for: POCLITH, LITHIUM   No results found for: PHENYTOIN, PHENOBARB, VALPROATE, CBMZ   .res Assessment: Plan:    Panic disorder with agoraphobia - Plan: sertraline (ZOLOFT) 100 MG tablet  Generalized anxiety disorder - Plan: sertraline (ZOLOFT) 100 MG tablet  Insomnia due to mental condition   Her panic disorder and generalized anxiety are much improved with the sertraline as opposed to the Lexapro.  We discussed the possible side effects and the pros and cons of going up in the dose versus leaving it where it is.  She has been on this dosage about 6 weeks.  She may get additional  improvement at this dosage.  Her last significant panic attack was 2 weeks ago.  We discussed the pros and cons of the Xanax usage and she is not using it excessively.  Is hoped that she will be able to gradually discontinue that except situationally.  She does have some significant situational anxiety over the health of her husband.  Supportive therapy around that issue.  Her insomnia is better with the treatment of the sertraline.  She can use trazodone if needed but it gives her a mild hangover.  No med changes this visit  Follow-up 2 months  I connected with patient by a video enabled telemedicine application or telephone, with their informed consent, and verified patient privacy and that I am speaking with the correct person using two identifiers.  I was located at office and patient at home .  Hiram Comber, MD, DFAPA  Please see After Visit Summary for patient specific instructions.  Future Appointments  Date Time Provider [place]  12/10/2018  2:00 PM Ivan Anchors, [place] [facility] None  10/04/2019  3:00 PM Megan Salon, MD [place] None    No orders of the defined types were placed in this encounter.     -------------------------------

## 2018-12-06 ENCOUNTER — Other Ambulatory Visit: Payer: Self-pay

## 2018-12-06 DIAGNOSIS — F4001 Agoraphobia with panic disorder: Secondary | ICD-10-CM

## 2018-12-06 NOTE — Telephone Encounter (Signed)
Prescription request is denied because is much too early.  We will not granted 90-day supply on this patient

## 2018-12-06 NOTE — Telephone Encounter (Signed)
Received fax from [facility] for Xanax for a 90 day supply. Last fill 11/16/2018 #60.

## 2018-12-10 ENCOUNTER — Ambulatory Visit (INDEPENDENT_AMBULATORY_CARE_PROVIDER_SITE_OTHER): Payer: BLUE CROSS/BLUE SHIELD | Admitting: Psychology

## 2018-12-10 DIAGNOSIS — F411 Generalized anxiety disorder: Secondary | ICD-10-CM

## 2018-12-11 MED ORDER — ALPRAZOLAM 0.5 MG PO TABS: 0.5000 mg | ORAL_TABLET | Freq: Three times a day (TID) | ORAL | 0 refills | Status: DC | PRN

## 2018-12-11 NOTE — Telephone Encounter (Signed)
At patient's last visit with me in March she indicated her anxiety was much improved with the change in SSRI.  She indicated she was taking 1 Xanax a day or less.  I do not want her becoming dependent on the Xanax and do not want her taking 2 or 3 daily.  Based on what she indicated to me about the quantity she was using her refill request is early.  Please inform her that she is to use it only in severe cases of anxiety and I do not want her using more than 2 a day maximum.  I will not prescribe more than 2 a day maximum.

## 2018-12-20 ENCOUNTER — Other Ambulatory Visit: Payer: Self-pay | Admitting: Psychiatry

## 2018-12-20 DIAGNOSIS — F5105 Insomnia due to other mental disorder: Secondary | ICD-10-CM

## 2018-12-31 ENCOUNTER — Ambulatory Visit (INDEPENDENT_AMBULATORY_CARE_PROVIDER_SITE_OTHER): Payer: BLUE CROSS/BLUE SHIELD | Admitting: Psychology

## 2018-12-31 DIAGNOSIS — F411 Generalized anxiety disorder: Secondary | ICD-10-CM | POA: Diagnosis not present

## 2019-01-08 ENCOUNTER — Ambulatory Visit (INDEPENDENT_AMBULATORY_CARE_PROVIDER_SITE_OTHER): Payer: BLUE CROSS/BLUE SHIELD | Admitting: Psychology

## 2019-01-08 DIAGNOSIS — F411 Generalized anxiety disorder: Secondary | ICD-10-CM | POA: Diagnosis not present

## 2019-01-15 ENCOUNTER — Ambulatory Visit (INDEPENDENT_AMBULATORY_CARE_PROVIDER_SITE_OTHER): Payer: BLUE CROSS/BLUE SHIELD | Admitting: Psychology

## 2019-01-15 DIAGNOSIS — F411 Generalized anxiety disorder: Secondary | ICD-10-CM | POA: Diagnosis not present

## 2019-01-24 ENCOUNTER — Ambulatory Visit (INDEPENDENT_AMBULATORY_CARE_PROVIDER_SITE_OTHER): Payer: BLUE CROSS/BLUE SHIELD | Admitting: Psychology

## 2019-01-24 DIAGNOSIS — F411 Generalized anxiety disorder: Secondary | ICD-10-CM | POA: Diagnosis not present

## 2019-01-28 ENCOUNTER — Encounter: Payer: Self-pay | Admitting: Psychiatry

## 2019-01-28 ENCOUNTER — Other Ambulatory Visit: Payer: Self-pay | Admitting: Psychiatry

## 2019-01-28 ENCOUNTER — Ambulatory Visit (INDEPENDENT_AMBULATORY_CARE_PROVIDER_SITE_OTHER): Payer: BLUE CROSS/BLUE SHIELD | Admitting: Psychiatry

## 2019-01-28 ENCOUNTER — Other Ambulatory Visit: Payer: Self-pay

## 2019-01-28 DIAGNOSIS — F5105 Insomnia due to other mental disorder: Secondary | ICD-10-CM | POA: Diagnosis not present

## 2019-01-28 DIAGNOSIS — F4001 Agoraphobia with panic disorder: Secondary | ICD-10-CM | POA: Diagnosis not present

## 2019-01-28 DIAGNOSIS — F411 Generalized anxiety disorder: Secondary | ICD-10-CM | POA: Diagnosis not present

## 2019-01-28 MED ORDER — SERTRALINE HCL 100 MG PO TABS: 100.0000 mg | ORAL_TABLET | Freq: Every day | ORAL | 1 refills | Status: DC

## 2019-01-28 NOTE — Progress Notes (Signed)
[name] [id number] [birth year] 43 y.o.  Subjective:   Patient ID:  Alexandra Welch is a 43 y.o. (DOB 1975/11/13) female.  Chief Complaint:  Chief Complaint  Patient presents with  . Follow-up    Medication Management  . Anxiety    HPI Alexandra Welch presents to the office today for follow-up of panic disorder, generalized anxiety disorder, and insomnia.  She was first seen on October 15, 2018.  Last visit November 26, 2018, no meds were changed.  We had previously changed from Lexapro to sertraline and it was more effective at managing panic attacks.  Also her insomnia was improved.  Overall still ok.  H still on chemo and doesn't sleep well and that's stressful.  He doesn't sleep well.  A few panic but less and can still be out of the blue and are usually those are nocturnal.  No consistent NM.  Better control of them but last 10-15 mins.  Not depressed generally.  Her anxiety was not well managed on Lexapro we did a cross taper from Lexapro over to sertraline 100 mg over 1 week.  We prescribed trazodone for insomnia.  Because of the severity of her anxiety and panic we also prescribed Xanax 0.25 to 0.5 mg 3 times a day as needed because we knew that she would not get benefit right away from the sertraline.  I do feel a little bit better and sleeping better.  Only 1 trazodone. Mild hangover.  Taking Xanax only if super anxious day.  Average less than 1 day.  Can still have nocturnal panic but less.  Last panic nocturnal 2 weeks ago.   Anxiety 4-5/10 good.  Worries over H getting sicker bc he was.  Patient reports stable mood and denies depressed or irritable moods.   Patient denies difficulty with sleep initiation or maintenance. Denies appetite disturbance.  Patient reports that energy and motivation have been good.  Patient denies any difficulty with concentration.  Patient denies any suicidal ideation.  Past Psychiatric Medication Trials: Ativan tiredness, Lexapro, paxil  years agoSE    Review of Systems:  Review of Systems  Neurological: Negative for tremors and weakness.    Medications: I have reviewed the patient's current medications.  Current Outpatient Medications  Medication Sig Dispense Refill  . albuterol (PROVENTIL HFA;VENTOLIN HFA) 108 (90 Base) MCG/ACT inhaler Inhale 2 puffs into the lungs 2 (two) times daily as needed. 3.7 g 6  . ALPRAZolam (XANAX) 0.5 MG tablet Take 1 tablet (0.5 mg total) by mouth 3 (three) times daily as needed for anxiety. 60 tablet 0  . Cholecalciferol (VITAMIN D PO) Take by mouth daily.    . fluticasone (FLONASE) 50 MCG/ACT nasal spray Place into both nostrils daily.    Marland Kitchen loratadine (CLARITIN) 10 MG tablet Take 10 mg by mouth daily.    . sertraline (ZOLOFT) 100 MG tablet Take 1 tablet (100 mg total) by mouth daily. 45 tablet 1  . traZODone (DESYREL) 50 MG tablet TAKE 1 TO 2 TABLETS BY MOUTH AT BEDTIME AS NEEDED FOR SLEEP 60 tablet 3   No current facility-administered medications for this visit.     Medication Side Effects: None  Allergies:  Allergies  Allergen Reactions  . Shellfish Allergy Swelling    Past Medical History:  Diagnosis Date  . Anxiety   . Asthma   . Basal cell carcinoma of skin    Dr. Derrel Nip  . Fibrocystic breast changes 08/27/2014   No hx breast biopsy. No FH  first degree relative with breast or ovarian ca. Getting yearly mammograms per prior PCP recs.   Marland Kitchen PVC (premature ventricular contraction)    negative cardiac work up    Family History  Problem Relation Age of Onset  . Lung cancer Father        smoker and worked in [employer]  . Asthma Mother   . Anxiety disorder Mother   . Post-traumatic stress disorder Mother   . Anxiety disorder Sister   . Obsessive Compulsive Disorder Son   . Anxiety disorder Sister   . Suicidality Brother        gambling died by suicide    Social History   Socioeconomic History  . Marital status: Married    Spouse name: Pilar Plate  . Number of  children: 2  . Years of education: Not on file  . Highest education level: Not on file  Occupational History  . Not on file  Social Needs  . Financial resource strain: Somewhat hard  . Food insecurity:    Worry: Not on file    Inability: Not on file  . Transportation needs:    Medical: Not on file    Non-medical: Not on file  Tobacco Use  . Smoking status: Never Smoker  . Smokeless tobacco: Never Used  Substance and Sexual Activity  . Alcohol use: Yes    Alcohol/week: 1.0 - 2.0 standard drinks    Types: 1 - 2 Standard drinks or equivalent per week    Comment: occ- 1-2 times per month  . Drug use: No  . Sexual activity: Yes    Partners: Male    Birth control/protection: Surgical    Comment: hysterectomy  Lifestyle  . Physical activity:    Days per week: Not on file    Minutes per session: Not on file  . Stress: Not on file  Relationships  . Social connections:    Talks on phone: Not on file    Gets together: Not on file    Attends religious service: Not on file    Active member of club or organization: Not on file    Attends meetings of clubs or organizations: Not on file    Relationship status: Not on file  . Intimate partner violence:    Fear of current or ex partner: Not on file    Emotionally abused: Not on file    Physically abused: Not on file    Forced sexual activity: Not on file  Other Topics Concern  . Not on file  Social History Narrative   Work or School: Scientist, clinical (histocompatibility and immunogenetics) [employer]      Home Situation: lives with husband and two older children      Spiritual Beliefs: Baptist      Lifestyle: exercise is sporadic; diet is ok       Past Medical History, Surgical history, Social history, and Family history were reviewed and updated as appropriate.   Please see review of systems for further details on the patient's review from today.   Objective:   Physical Exam:  LMP 08/29/2005 (Approximate)   Physical Exam Constitutional:      General:  She is not in acute distress.    Appearance: She is well-developed.  Musculoskeletal:        General: No deformity.  Neurological:     Mental Status: She is alert and oriented to person, place, and time.     Coordination: Coordination normal.  Psychiatric:  Attention and Perception: Attention normal. She does not perceive auditory hallucinations.        Mood and Affect: Mood is anxious. Mood is not depressed. Affect is not labile, blunt, angry or inappropriate.        Speech: Speech normal.        Behavior: Behavior normal.        Thought Content: Thought content normal. Thought content does not include homicidal or suicidal ideation. Thought content does not include homicidal or suicidal plan.        Cognition and Memory: Cognition normal.        Judgment: Judgment normal.     Comments: Insight intact. No auditory or visual hallucinations. No delusions.  Affect is a little anxious as well as mood.     Lab Review:     Component Value Date/Time   NA 140 07/19/2018 1516   K 4.6 07/19/2018 1516   CL 102 07/19/2018 1516   CO2 23 07/19/2018 1516   GLUCOSE 74 07/19/2018 1516   GLUCOSE 78 11/29/2016 1413   BUN 10 07/19/2018 1516   CREATININE 0.75 07/19/2018 1516   CREATININE 0.80 11/29/2016 1413   CALCIUM 9.3 07/19/2018 1516   PROT 7.4 11/29/2016 1413   ALBUMIN 4.4 11/29/2016 1413   AST 16 11/29/2016 1413   ALT 12 11/29/2016 1413   ALKPHOS 35 11/29/2016 1413   BILITOT 0.7 11/29/2016 1413   GFRNONAA 97 07/19/2018 1516   GFRAA 112 07/19/2018 1516       Component Value Date/Time   WBC 6.2 08/12/2013 0857   RBC 4.53 08/12/2013 0857   HGB 13.5 08/12/2013 0857   HCT 39.5 08/12/2013 0857   PLT 325.0 08/12/2013 0857   MCV 87.3 08/12/2013 0857   MCHC 34.1 08/12/2013 0857   RDW 13.1 08/12/2013 0857   LYMPHSABS 1.9 08/12/2013 0857   MONOABS 0.5 08/12/2013 0857   EOSABS 0.3 08/12/2013 0857   BASOSABS 0.0 08/12/2013 0857    No results found for: POCLITH, LITHIUM   No  results found for: PHENYTOIN, PHENOBARB, VALPROATE, CBMZ   .res Assessment: Plan:    Insomnia due to mental condition  Panic disorder with agoraphobia - Plan: sertraline (ZOLOFT) 100 MG tablet  Generalized anxiety disorder - Plan: sertraline (ZOLOFT) 100 MG tablet   Her panic disorder and generalized anxiety are much improved with the sertraline as opposed to the Lexapro.  However she is still having panic attacks once or twice a week.  She believes the major source of her anxiety is her husband's serious cancer.  But she can have nocturnal panic attacks that wake her out of sleep for no apparent reason.  We discussed the pros and cons of going up in the sertraline but that clearly makes sense to try to further suppress especially the spontaneous panic attacks.  She agrees  Increase sertraline to 150 mg daily for panic.  We discussed the pros and cons of the Xanax usage and she is not using it excessively.  Is hoped that she will be able to gradually discontinue that except situationally.  She does have some significant situational anxiety over the health of her husband.  Supportive therapy around that issue.  Her insomnia is better with the treatment of the sertraline.  She can use trazodone if needed but it gives her a mild hangover.  Follow-up 2 months  Hiram Comber, MD, DFAPA  Please see After Visit Summary for patient specific instructions.  Future Appointments  Date Time Provider [place]  10/04/2019  3:00 PM Megan Salon, MD [place] None    No orders of the defined types were placed in this encounter.     -------------------------------

## 2019-02-05 ENCOUNTER — Ambulatory Visit (INDEPENDENT_AMBULATORY_CARE_PROVIDER_SITE_OTHER): Payer: BC Managed Care – PPO | Admitting: Psychology

## 2019-02-05 DIAGNOSIS — F411 Generalized anxiety disorder: Secondary | ICD-10-CM

## 2019-02-14 ENCOUNTER — Ambulatory Visit (INDEPENDENT_AMBULATORY_CARE_PROVIDER_SITE_OTHER): Payer: Self-pay | Admitting: Psychology

## 2019-02-14 DIAGNOSIS — F411 Generalized anxiety disorder: Secondary | ICD-10-CM

## 2019-03-05 ENCOUNTER — Ambulatory Visit (INDEPENDENT_AMBULATORY_CARE_PROVIDER_SITE_OTHER): Payer: BLUE CROSS/BLUE SHIELD | Admitting: Psychology

## 2019-03-05 DIAGNOSIS — F411 Generalized anxiety disorder: Secondary | ICD-10-CM

## 2019-03-14 ENCOUNTER — Ambulatory Visit: Payer: BLUE CROSS/BLUE SHIELD | Admitting: Psychology

## 2019-03-26 ENCOUNTER — Ambulatory Visit (INDEPENDENT_AMBULATORY_CARE_PROVIDER_SITE_OTHER): Payer: BLUE CROSS/BLUE SHIELD | Admitting: Psychology

## 2019-03-26 DIAGNOSIS — F411 Generalized anxiety disorder: Secondary | ICD-10-CM

## 2019-03-28 ENCOUNTER — Ambulatory Visit: Payer: BLUE CROSS/BLUE SHIELD | Admitting: Psychiatry

## 2019-04-16 ENCOUNTER — Ambulatory Visit: Payer: BLUE CROSS/BLUE SHIELD | Admitting: Psychology

## 2019-04-23 ENCOUNTER — Ambulatory Visit (INDEPENDENT_AMBULATORY_CARE_PROVIDER_SITE_OTHER): Payer: 59 | Admitting: Psychology

## 2019-04-23 DIAGNOSIS — F411 Generalized anxiety disorder: Secondary | ICD-10-CM

## 2019-05-14 ENCOUNTER — Encounter: Payer: Self-pay | Admitting: Psychiatry

## 2019-05-14 ENCOUNTER — Ambulatory Visit (INDEPENDENT_AMBULATORY_CARE_PROVIDER_SITE_OTHER): Payer: 59 | Admitting: Psychology

## 2019-05-14 ENCOUNTER — Ambulatory Visit (INDEPENDENT_AMBULATORY_CARE_PROVIDER_SITE_OTHER): Payer: 59 | Admitting: Psychiatry

## 2019-05-14 ENCOUNTER — Other Ambulatory Visit: Payer: Self-pay

## 2019-05-14 DIAGNOSIS — F411 Generalized anxiety disorder: Secondary | ICD-10-CM

## 2019-05-14 DIAGNOSIS — F5105 Insomnia due to other mental disorder: Secondary | ICD-10-CM | POA: Diagnosis not present

## 2019-05-14 DIAGNOSIS — F4001 Agoraphobia with panic disorder: Secondary | ICD-10-CM | POA: Diagnosis not present

## 2019-05-14 MED ORDER — SERTRALINE HCL 100 MG PO TABS: 150.0000 mg | ORAL_TABLET | Freq: Every day | ORAL | 1 refills | Status: DC

## 2019-05-14 MED ORDER — ALPRAZOLAM 0.5 MG PO TABS: 0.5000 mg | ORAL_TABLET | Freq: Three times a day (TID) | ORAL | 1 refills | Status: DC | PRN

## 2019-05-14 NOTE — Progress Notes (Signed)
Alexandra Welch [id number] [birth year] 43 y.o.  Subjective:   Patient ID:  Alexandra Welch is a 43 y.o. (DOB June 02, 1976) female.  Chief Complaint:  Chief Complaint  Patient presents with  . Follow-up    Medication Management  . Other    Panic disorder with Agoraphobia    HPI Alexandra Welch presents to the office today for follow-up of panic disorder, generalized anxiety disorder, and insomnia.  She was first seen on October 15, 2018.  Last visit was January 28, 2019.  She was having continued panic attacks but they overall were much improved on sertraline 100 mg versus Lexapro.  However she was still having some nocturnal panic attacks as well so we decided to increase sertraline to 150 mg daily.  Even a little better.  Instead of 1-2 week they are reduced to 1-2/month usually as she's either going or waking from sleep.  Not depressed but residual anxiety.  Started a new job with [employer].  No SE.    Overall still ok.  H still on chemo and doesn't sleep well and that's stressful.  He's still not doing well and this causes anxiety.  He doesn't sleep well.  A few panic but less and can still be out of the blue and are usually those are nocturnal.  No consistent NM.  Better control of them but last 10-15 mins.  Not depressed generally.  Her anxiety was not well managed on Lexapro we did a cross taper from Lexapro over to sertraline 100 mg over 1 week.  We prescribed trazodone for insomnia.  Because of the severity of her anxiety and panic we also prescribed Xanax 0.25 to 0.5 mg 3 times a day as needed because we knew that she would not get benefit right away from the sertraline.   Taking Xanax only if super anxious day except 1/2 tablet every morning.  Average less than 1 day.  Can still have nocturnal panic but less.  Last panic nocturnal 2 weeks ago.   Anxiety 3/10.  Worries over H getting sicker bc he was.  Patient reports stable mood and denies depressed or irritable moods.   Patient  denies difficulty with sleep initiation or maintenance. 5-6 hours of sleep and it's beetter than it was.  H illness interferes with her sleep.  Denies appetite disturbance.  Patient reports that energy and motivation have been good.  Patient denies any difficulty with concentration.  Patient denies any suicidal ideation.  No caffeine.  Past Psychiatric Medication Trials: Ativan tiredness, Lexapro, paxil years agoSE  Review of Systems:  Review of Systems  Cardiovascular: Positive for palpitations.       Normal cardiac workup.  Palpitations with anxiety  Neurological: Negative for tremors and weakness.    Medications: I have reviewed the patient's current medications.  Current Outpatient Medications  Medication Sig Dispense Refill  . albuterol (PROVENTIL HFA;VENTOLIN HFA) 108 (90 Base) MCG/ACT inhaler Inhale 2 puffs into the lungs 2 (two) times daily as needed. 3.7 g 6  . ALPRAZolam (XANAX) 0.5 MG tablet TAKE 1 TABLET BY MOUTH THREE TIMES DAILY AS NEEDED FOR ANXIETY 60 tablet 1  . Cholecalciferol (VITAMIN D PO) Take by mouth daily.    Marland Kitchen loratadine (CLARITIN) 10 MG tablet Take 10 mg by mouth daily.    . sertraline (ZOLOFT) 100 MG tablet Take 1 tablet (100 mg total) by mouth daily. 45 tablet 1  . traZODone (DESYREL) 50 MG tablet TAKE 1 TO 2 TABLETS BY MOUTH AT BEDTIME  AS NEEDED FOR SLEEP 60 tablet 3   No current facility-administered medications for this visit.     Medication Side Effects: None  Allergies:  Allergies  Allergen Reactions  . Shellfish Allergy Swelling    Past Medical History:  Diagnosis Date  . Anxiety   . Asthma   . Basal cell carcinoma of skin    Dr. Derrel Nip  . Fibrocystic breast changes 08/27/2014   No hx breast biopsy. No FH first degree relative with breast or ovarian ca. Getting yearly mammograms per prior PCP recs.   Marland Kitchen PVC (premature ventricular contraction)    negative cardiac work up    Family History  Problem Relation Age of Onset  . Lung  cancer Father        smoker and worked in [employer]  . Asthma Mother   . Anxiety disorder Mother   . Post-traumatic stress disorder Mother   . Anxiety disorder Sister   . Obsessive Compulsive Disorder Son   . Anxiety disorder Sister   . Suicidality Brother        gambling died by suicide    Social History   Socioeconomic History  . Marital status: Married    Spouse name: Pilar Plate  . Number of children: 2  . Years of education: Not on file  . Highest education level: Not on file  Occupational History  . Not on file  Social Needs  . Financial resource strain: Somewhat hard  . Food insecurity    Worry: Not on file    Inability: Not on file  . Transportation needs    Medical: Not on file    Non-medical: Not on file  Tobacco Use  . Smoking status: Never Smoker  . Smokeless tobacco: Never Used  Substance and Sexual Activity  . Alcohol use: Yes    Alcohol/week: 1.0 - 2.0 standard drinks    Types: 1 - 2 Standard drinks or equivalent per week    Comment: occ- 1-2 times per month  . Drug use: No  . Sexual activity: Yes    Partners: Male    Birth control/protection: Surgical    Comment: hysterectomy  Lifestyle  . Physical activity    Days per week: Not on file    Minutes per session: Not on file  . Stress: Not on file  Relationships  . Social Herbalist on phone: Not on file    Gets together: Not on file    Attends religious service: Not on file    Active member of club or organization: Not on file    Attends meetings of clubs or organizations: Not on file    Relationship status: Not on file  . Intimate partner violence    Fear of current or ex partner: Not on file    Emotionally abused: Not on file    Physically abused: Not on file    Forced sexual activity: Not on file  Other Topics Concern  . Not on file  Social History Narrative   Work or School: Scientist, clinical (histocompatibility and immunogenetics) [employer]      Home Situation: lives with husband and two older children       Spiritual Beliefs: Baptist      Lifestyle: exercise is sporadic; diet is ok       Past Medical History, Surgical history, Social history, and Family history were reviewed and updated as appropriate.   Please see review of systems for further details on the patient's review from today.  Objective:   Physical Exam:  LMP 08/29/2005 (Approximate)   Physical Exam Constitutional:      General: She is not in acute distress.    Appearance: She is well-developed.  Musculoskeletal:        General: No deformity.  Neurological:     Mental Status: She is alert and oriented to person, place, and time.     Coordination: Coordination normal.  Psychiatric:        Attention and Perception: Attention normal. She does not perceive auditory hallucinations.        Mood and Affect: Mood is anxious. Mood is not depressed. Affect is not labile, blunt, angry or inappropriate.        Speech: Speech normal.        Behavior: Behavior normal.        Thought Content: Thought content normal. Thought content does not include homicidal or suicidal ideation. Thought content does not include homicidal or suicidal plan.        Cognition and Memory: Cognition normal.        Judgment: Judgment normal.     Comments: Insight intact. No auditory or visual hallucinations. No delusions.  Affect is a little anxious as well as mood.     Lab Review:     Component Value Date/Time   NA 140 07/19/2018 1516   K 4.6 07/19/2018 1516   CL 102 07/19/2018 1516   CO2 23 07/19/2018 1516   GLUCOSE 74 07/19/2018 1516   GLUCOSE 78 11/29/2016 1413   BUN 10 07/19/2018 1516   CREATININE 0.75 07/19/2018 1516   CREATININE 0.80 11/29/2016 1413   CALCIUM 9.3 07/19/2018 1516   PROT 7.4 11/29/2016 1413   ALBUMIN 4.4 11/29/2016 1413   AST 16 11/29/2016 1413   ALT 12 11/29/2016 1413   ALKPHOS 35 11/29/2016 1413   BILITOT 0.7 11/29/2016 1413   GFRNONAA 97 07/19/2018 1516   GFRAA 112 07/19/2018 1516       Component Value  Date/Time   WBC 6.2 08/12/2013 0857   RBC 4.53 08/12/2013 0857   HGB 13.5 08/12/2013 0857   HCT 39.5 08/12/2013 0857   PLT 325.0 08/12/2013 0857   MCV 87.3 08/12/2013 0857   MCHC 34.1 08/12/2013 0857   RDW 13.1 08/12/2013 0857   LYMPHSABS 1.9 08/12/2013 0857   MONOABS 0.5 08/12/2013 0857   EOSABS 0.3 08/12/2013 0857   BASOSABS 0.0 08/12/2013 0857    No results found for: POCLITH, LITHIUM   No results found for: PHENYTOIN, PHENOBARB, VALPROATE, CBMZ   .res Assessment: Plan:    Panic disorder with agoraphobia  Generalized anxiety disorder  Insomnia due to mental condition   Her panic disorder and generalized anxiety are much improved with the sertraline as opposed to the Lexapro.  However she is still having panic attacks once or twice a month she is satisfied with the outcome with medication..  She believes the major source of her anxiety is her husband's serious cancer.  But she can have nocturnal panic attacks that wake her out of sleep for no apparent reason.  We discussed the pros and cons of going up in the sertraline but that clearly makes sense to try to further suppress especially the spontaneous panic attacks.    She decided to continue the current dose of sertraline 150 mg daily and not increase to 200 mg daily.  We discussed the pros and cons of the Xanax usage and she is not using it excessively.  Is hoped that she  will be able to gradually discontinue that except situationally.  She does have some significant situational anxiety over the health of her husband.  Supportive therapy around that issue.  She was encouraged to try to wean the Xanax if possible and just use it as needed for panic.  Her insomnia is better with the treatment of the sertraline.  She can use trazodone if needed but it gives her a mild hangover.  Follow-up 4 months  Hiram Comber, MD, DFAPA  Please see After Visit Summary for patient specific instructions.  Future Appointments  Date Time Provider  [place]  05/14/2019  4:00 PM Ivan Anchors [facility] None  10/04/2019  3:00 PM Megan Salon, MD [place] None    No orders of the defined types were placed in this encounter.     -------------------------------

## 2019-05-28 ENCOUNTER — Other Ambulatory Visit: Payer: Self-pay

## 2019-05-28 ENCOUNTER — Ambulatory Visit (INDEPENDENT_AMBULATORY_CARE_PROVIDER_SITE_OTHER): Payer: 59 | Admitting: Psychology

## 2019-05-28 DIAGNOSIS — F411 Generalized anxiety disorder: Secondary | ICD-10-CM

## 2019-07-03 ENCOUNTER — Other Ambulatory Visit: Payer: Self-pay | Admitting: Psychiatry

## 2019-07-03 DIAGNOSIS — F4001 Agoraphobia with panic disorder: Secondary | ICD-10-CM

## 2019-07-04 NOTE — Telephone Encounter (Signed)
Last fill 09/15 any refills? Due back in Jan.

## 2019-08-10 ENCOUNTER — Encounter: Payer: Self-pay | Admitting: Emergency Medicine

## 2019-08-10 ENCOUNTER — Emergency Department (INDEPENDENT_AMBULATORY_CARE_PROVIDER_SITE_OTHER)
Admission: EM | Admit: 2019-08-10 | Discharge: 2019-08-10 | Disposition: A | Discharge status: Home / Self Care | Payer: Self-pay | Source: Home / Self Care

## 2019-08-10 ENCOUNTER — Other Ambulatory Visit: Payer: Self-pay

## 2019-08-10 DIAGNOSIS — L03011 Cellulitis of right finger: Secondary | ICD-10-CM

## 2019-08-10 MED ORDER — SULFAMETHOXAZOLE-TRIMETHOPRIM 800-160 MG PO TABS: 1.0000 | ORAL_TABLET | Freq: Two times a day (BID) | ORAL | 0 refills | Status: DC

## 2019-08-10 NOTE — ED Triage Notes (Signed)
Patient c/o right middle finger redness and pain, no apparent injury

## 2019-08-10 NOTE — Discharge Instructions (Signed)
Soak finger 20 minutes 4 times a day.  Antibiotics as directed.  Return if any problems.

## 2019-08-11 NOTE — ED Provider Notes (Signed)
[facility]    CSN: [id number] Arrival date & time: 08/10/19  1248      History   Chief Complaint Chief Complaint  Patient presents with  . Hand Pain    HPI JOSHALYNN RIVKIN is a 43 y.o. female.   Patient complains of redness and swelling around the nail of her right index finger.  The history is provided by the patient. No language interpreter was used.  Hand Pain This is a new problem. The current episode started yesterday. The problem occurs constantly. The problem has not changed since onset.Nothing aggravates the symptoms. Nothing relieves the symptoms. The treatment provided no relief.    Past Medical History:  Diagnosis Date  . Anxiety   . Asthma   . Basal cell carcinoma of skin    Dr. Derrel Nip  . Fibrocystic breast changes 08/27/2014   No hx breast biopsy. No FH first degree relative with breast or ovarian ca. Getting yearly mammograms per prior PCP recs.   Marland Kitchen PVC (premature ventricular contraction)    negative cardiac work up    Patient Active Problem List   Diagnosis Date Noted  . Mild intermittent asthma 08/27/2014  . Fibrocystic breast changes 08/27/2014  . Anxiety state 11/29/2007    Past Surgical History:  Procedure Laterality Date  . LAPAROSCOPIC HYSTERECTOMY  2007   for cervical dysplasia    OB History    Gravida  5   Para  2   Term      Preterm      AB      Living  2     SAB      TAB      Ectopic      Multiple      Live Births               Home Medications    Prior to Admission medications   Medication Sig Start Date End Date Taking? Authorizing Provider  albuterol (PROVENTIL HFA;VENTOLIN HFA) 108 (90 Base) MCG/ACT inhaler Inhale 2 puffs into the lungs 2 (two) times daily as needed. 06/01/17  Yes Lucretia Kern, DO  ALPRAZolam Duanne Moron) 0.5 MG tablet TAKE 1 TABLET BY MOUTH THREE TIMES DAILY AS NEEDED FOR ANXIETY 07/04/19  Yes Cottle, Billey Co., MD  Cholecalciferol (VITAMIN D PO) Take by mouth daily.    Yes [provider]  loratadine (CLARITIN) 10 MG tablet Take 10 mg by mouth daily.   Yes [provider]  sertraline (ZOLOFT) 100 MG tablet Take 1.5 tablets (150 mg total) by mouth daily. 05/14/19  Yes Cottle, Billey Co., MD  sulfamethoxazole-trimethoprim (BACTRIM DS) 800-160 MG tablet Take 1 tablet by mouth 2 (two) times daily. 08/10/19   Fransico Meadow, PA-C    Family History Family History  Problem Relation Age of Onset  . Lung cancer Father        smoker and worked in [employer]  . Asthma Mother   . Anxiety disorder Mother   . Post-traumatic stress disorder Mother   . Anxiety disorder Sister   . Obsessive Compulsive Disorder Son   . Anxiety disorder Sister   . Suicidality Brother        gambling died by suicide    Social History Social History   Tobacco Use  . Smoking status: Never Smoker  . Smokeless tobacco: Never Used  Substance Use Topics  . Alcohol use: Yes    Alcohol/week: 1.0 - 2.0 standard drinks    Types:  1 - 2 Standard drinks or equivalent per week    Comment: occ- 1-2 times per month  . Drug use: No     Allergies   Shellfish allergy   Review of Systems Review of Systems  All other systems reviewed and are negative.    Physical Exam Triage Vital Signs ED Triage Vitals [08/10/19 1308]  Enc Vitals Group     BP (!) 155/96     Pulse Rate (!) 103     Resp      Temp 98.8 F (37.1 C)     Temp Source Oral     SpO2 100 %     Weight 174 lb (78.9 kg)     Height 5\' 3"  (1.6 m)     Head Circumference      Peak Flow      Pain Score 5     Pain Loc      Pain Edu?      Excl. in [place]?    No data found.  Updated Vital Signs BP (!) 155/96 (BP Location: Left Arm)   Pulse (!) 103   Temp 98.8 F (37.1 C) (Oral)   Ht 5\' 3"  (1.6 m)   Wt 78.9 kg   LMP 08/29/2005 (Approximate)   SpO2 100%   BMI 30.82 kg/m   Visual Acuity Right Eye Distance:   Left Eye Distance:   Bilateral Distance:    Right Eye Near:   Left Eye Near:     Bilateral Near:     Physical Exam Vitals reviewed.  Cardiovascular:     Rate and Rhythm: Normal rate.  Pulmonary:     Effort: Pulmonary effort is normal.  Musculoskeletal:        General: Swelling and tenderness present.     Comments: Patient has redness and slight swelling around the corner of her right index finger.  Not feel an area of fluctuance  Skin:    General: Skin is warm.  Neurological:     General: No focal deficit present.     Mental Status: She is alert.  Psychiatric:        Mood and Affect: Mood normal.      UC Treatments / Results  Labs (all labs ordered are listed, but only abnormal results are displayed) Labs Reviewed - No data to display  EKG   Radiology No results found.  Procedures Procedures (including critical care time)  Medications Ordered in UC Medications - No data to display  Initial Impression / Assessment and Plan / UC Course  I have reviewed the triage vital signs and the nursing notes.  Pertinent labs & imaging results that were available during my care of the patient were reviewed by me and considered in my medical decision making (see chart for details).     MDM I attempted needle aspiration but there was no digit counseled patient at length I think she has an early paronychia and he advised her to soak area patient started on a prescription for Bactrim patient is advised to return for recheck if creased redness swelling or purulent appearance Final Clinical Impressions(s) / UC Diagnoses   Final diagnoses:  Paronychia of finger of right hand     Discharge Instructions     Soak finger 20 minutes 4 times a day.  Antibiotics as directed.  Return if any problems.   ED Prescriptions    Medication Sig Dispense Auth. Provider   sulfamethoxazole-trimethoprim (BACTRIM DS) 800-160 MG tablet Take 1 tablet by mouth  2 (two) times daily. 20 tablet Fransico Meadow, [place]     PDMP not reviewed this encounter.   Fransico Meadow, [place]  08/11/19 [id number]

## 2019-09-10 ENCOUNTER — Other Ambulatory Visit: Payer: Self-pay

## 2019-09-10 ENCOUNTER — Ambulatory Visit (INDEPENDENT_AMBULATORY_CARE_PROVIDER_SITE_OTHER): Payer: 59 | Admitting: Psychiatry

## 2019-09-10 ENCOUNTER — Encounter: Payer: Self-pay | Admitting: Psychiatry

## 2019-09-10 DIAGNOSIS — F411 Generalized anxiety disorder: Secondary | ICD-10-CM | POA: Diagnosis not present

## 2019-09-10 DIAGNOSIS — F4001 Agoraphobia with panic disorder: Secondary | ICD-10-CM | POA: Diagnosis not present

## 2019-09-10 DIAGNOSIS — F5105 Insomnia due to other mental disorder: Secondary | ICD-10-CM

## 2019-09-10 MED ORDER — ALPRAZOLAM 0.5 MG PO TABS: 0.5000 mg | ORAL_TABLET | Freq: Three times a day (TID) | ORAL | 0 refills | Status: DC | PRN

## 2019-09-10 NOTE — Progress Notes (Signed)
Alexandra Welch [id number] 1976/05/28 44 y.o.  Subjective:   Patient ID:  Alexandra Welch is a 44 y.o. (DOB Jul 01, 1976) female.  Chief Complaint:  Chief Complaint  Patient presents with  . Follow-up     Medication Management  . Anxiety    HPI Alexandra Welch presents to the office today for follow-up of panic disorder, generalized anxiety disorder, and insomnia.  She was first seen on October 15, 2018.  visit January 28, 2019.  She was having continued panic attacks but they overall were much improved on sertraline 100 mg versus Lexapro.  However she was still having some nocturnal panic attacks as well so we decided to increase sertraline to 150 mg daily.  Last visit May 14, 2019.  She was still having 1-2 panic attacks on sertraline 150 mg daily but that was improved.  She was encouraged to increase it to 200 mg daily to try to completely suppress the panic but she chose not to do so.  Panic a little better about once per month.  Nocturnal only now.  NM are not the cause.    Better control of them but last 10-15 mins.  Not depressed generally.  Circumstantial anxiety noted.  Sleep 6 hours.  Awakes 2-3 AM and can be awake 1 1/2 hours.  Gets up 6 am.  Some tiredness in the afternoon.    Even a little better.  Instead of 1-2 week they are reduced to 1-2/month usually as she's either going or waking from sleep.  Not depressed but residual anxiety.  Started a new job with [employer].  No SE.    Overall still ok.  H still on chemo and not doing well. and doesn't sleep well and that's stressful.  He's still not doing well and this causes anxiety.  He doesn't sleep well.  A few panic but less and can still be out of the blue and are usually those are nocturnal.  No consistent NM.   She is only having nocturnal panic attacks at this time.  Overrate all anxiety is okay with the exception of worry about her husband.  She does not sleep enough and has early morning awakening.  It is not always  associated with anxiety.  Denies appetite disturbance.  Patient reports that energy and motivation have been good.  Patient denies any difficulty with concentration.  Patient denies any suicidal ideation.  No caffeine.  No history Alc drug problems.   Past Psychiatric Medication Trials:  Ativan tiredness, Lexapro, paxil years agoSE Trazodone hangover M takes Klonopin HS Review of Systems:  Review of Systems  Cardiovascular: Positive for palpitations.       Normal cardiac workup.  Palpitations with anxiety  Neurological: Negative for tremors and weakness.    Medications: I have reviewed the patient's current medications.  Current Outpatient Medications  Medication Sig Dispense Refill  . albuterol (PROVENTIL HFA;VENTOLIN HFA) 108 (90 Base) MCG/ACT inhaler Inhale 2 puffs into the lungs 2 (two) times daily as needed. 3.7 g 6  . ALPRAZolam (XANAX) 0.5 MG tablet TAKE 1 TABLET BY MOUTH THREE TIMES DAILY AS NEEDED FOR ANXIETY 60 tablet 1  . Cholecalciferol (VITAMIN D PO) Take by mouth daily.    Marland Kitchen loratadine (CLARITIN) 10 MG tablet Take 10 mg by mouth daily.    . sertraline (ZOLOFT) 100 MG tablet Take 1.5 tablets (150 mg total) by mouth daily. 135 tablet 1   No current facility-administered medications for this visit.    Medication Side Effects:  None  Allergies:  Allergies  Allergen Reactions  . Shellfish Allergy Swelling    Past Medical History:  Diagnosis Date  . Anxiety   . Asthma   . Basal cell carcinoma of skin    Dr. Derrel Nip  . Fibrocystic breast changes 08/27/2014   No hx breast biopsy. No FH first degree relative with breast or ovarian ca. Getting yearly mammograms per prior PCP recs.   Marland Kitchen PVC (premature ventricular contraction)    negative cardiac work up    Family History  Problem Relation Age of Onset  . Lung cancer Father        smoker and worked in [employer]  . Asthma Mother   . Anxiety disorder Mother   . Post-traumatic stress disorder Mother   . Anxiety  disorder Sister   . Obsessive Compulsive Disorder Son   . Anxiety disorder Sister   . Suicidality Brother        gambling died by suicide    Social History   Socioeconomic History  . Marital status: Married    Spouse name: Pilar Plate  . Number of children: 2  . Years of education: Not on file  . Highest education level: Not on file  Occupational History  . Not on file  Tobacco Use  . Smoking status: Never Smoker  . Smokeless tobacco: Never Used  Substance and Sexual Activity  . Alcohol use: Yes    Alcohol/week: 1.0 - 2.0 standard drinks    Types: 1 - 2 Standard drinks or equivalent per week    Comment: occ- 1-2 times per month  . Drug use: No  . Sexual activity: Yes    Partners: Male    Birth control/protection: Surgical    Comment: hysterectomy  Other Topics Concern  . Not on file  Social History Narrative   Work or School: Scientist, clinical (histocompatibility and immunogenetics) [employer]      Home Situation: lives with husband and two older children      Spiritual Beliefs: Baptist      Lifestyle: exercise is sporadic; diet is ok      Scientist, physiological Strain: Medium Risk  . Difficulty of Paying Living Expenses: Somewhat hard  Food Insecurity:   . Worried About Charity fundraiser in the Last Year: Not on file  . Ran Out of Food in the Last Year: Not on file  Transportation Needs:   . Lack of Transportation (Medical): Not on file  . Lack of Transportation (Non-Medical): Not on file  Physical Activity:   . Days of Exercise per Week: Not on file  . Minutes of Exercise per Session: Not on file  Stress:   . Feeling of Stress : Not on file  Social Connections:   . Frequency of Communication with Friends and Family: Not on file  . Frequency of Social Gatherings with Friends and Family: Not on file  . Attends Religious Services: Not on file  . Active Member of Clubs or Organizations: Not on file  . Attends Archivist Meetings: Not on file  . Marital  Status: Not on file  Intimate Partner Violence:   . Fear of Current or Ex-Partner: Not on file  . Emotionally Abused: Not on file  . Physically Abused: Not on file  . Sexually Abused: Not on file    Past Medical History, Surgical history, Social history, and Family history were reviewed and updated as appropriate.   Please see review of systems for further  details on the patient's review from today.   Objective:   Physical Exam:  LMP 08/29/2005 (Approximate)   Physical Exam Constitutional:      General: She is not in acute distress.    Appearance: She is well-developed.  Musculoskeletal:        General: No deformity.  Neurological:     Mental Status: She is alert and oriented to person, place, and time.     Coordination: Coordination normal.  Psychiatric:        Attention and Perception: Attention normal. She does not perceive auditory hallucinations.        Mood and Affect: Mood is anxious. Mood is not depressed. Affect is not labile, blunt, angry or inappropriate.        Speech: Speech normal.        Behavior: Behavior normal.        Thought Content: Thought content normal. Thought content does not include homicidal or suicidal ideation. Thought content does not include homicidal or suicidal plan.        Cognition and Memory: Cognition normal.        Judgment: Judgment normal.     Comments: Insight intact. No auditory or visual hallucinations. No delusions.  Affect is a little anxious as well as mood.     Lab Review:     Component Value Date/Time   NA 140 07/19/2018 1516   K 4.6 07/19/2018 1516   CL 102 07/19/2018 1516   CO2 23 07/19/2018 1516   GLUCOSE 74 07/19/2018 1516   GLUCOSE 78 11/29/2016 1413   BUN 10 07/19/2018 1516   CREATININE 0.75 07/19/2018 1516   CREATININE 0.80 11/29/2016 1413   CALCIUM 9.3 07/19/2018 1516   PROT 7.4 11/29/2016 1413   ALBUMIN 4.4 11/29/2016 1413   AST 16 11/29/2016 1413   ALT 12 11/29/2016 1413   ALKPHOS 35 11/29/2016 1413    BILITOT 0.7 11/29/2016 1413   GFRNONAA 97 07/19/2018 1516   GFRAA 112 07/19/2018 1516       Component Value Date/Time   WBC 6.2 08/12/2013 0857   RBC 4.53 08/12/2013 0857   HGB 13.5 08/12/2013 0857   HCT 39.5 08/12/2013 0857   PLT 325.0 08/12/2013 0857   MCV 87.3 08/12/2013 0857   MCHC 34.1 08/12/2013 0857   RDW 13.1 08/12/2013 0857   LYMPHSABS 1.9 08/12/2013 0857   MONOABS 0.5 08/12/2013 0857   EOSABS 0.3 08/12/2013 0857   BASOSABS 0.0 08/12/2013 0857    No results found for: POCLITH, LITHIUM   No results found for: PHENYTOIN, PHENOBARB, VALPROATE, CBMZ   .res Assessment: Plan:    Panic disorder with agoraphobia  Generalized anxiety disorder  Insomnia due to mental condition   Greater than 50% of 30 min face to face time with patient was spent on counseling and coordination of care.  She believes the major source of her anxiety is her husband's serious cancer.  But she can have nocturnal panic attacks that wake her out of sleep for no apparent reason.  These are the only type of panic attack she is having currently.  We discussed the pros and cons of going up in the sertraline but that clearly makes sense to try to further suppress especially the spontaneous panic attacks.    She decided to continue the current dose of sertraline 150 mg daily   We discussed the pros and cons of the Xanax usage and she is not using it excessively.  Is hoped that she will be  able to gradually discontinue that except situationally.  She does have some significant situational anxiety over the health of her husband.  Supportive therapy around that issue.   Patient's insomnia failed to respond to trazodone because of hangover.  She has continued early morning awakening and decreased quantity of sleep.  This is probably contributing to her not feeling as well during the day.  Discussed at length the pros and cons of using the Xanax on a consistent basis at night.  She has a family history of chronic  insomnia.  We discussed her absence of history of drug and alcohol abuse.  We discussed the tolerance issues around benzodiazepines when used chronically for sleep but they can be used successfully at times as is the case with her mother.  We discussed withdrawal symptoms.  It is unlikely that sleep agents that do not address anxiety will help her sleep.  Therefore recommend trial of Xanax 0.25 to 0.5 mg nightly on a consistent basis both to block the nocturnal panic attacks and to treat the insomnia.  Follow-up 4 months  Hiram Comber, MD, DFAPA  Please see After Visit Summary for patient specific instructions.  Future Appointments  Date Time Provider [place]  10/04/2019  3:00 PM Megan Salon, MD [place] None    No orders of the defined types were placed in this encounter.     -------------------------------

## 2019-10-04 ENCOUNTER — Ambulatory Visit (INDEPENDENT_AMBULATORY_CARE_PROVIDER_SITE_OTHER): Payer: Managed Care, Other (non HMO) | Admitting: Obstetrics & Gynecology

## 2019-10-04 ENCOUNTER — Other Ambulatory Visit: Payer: Self-pay

## 2019-10-04 ENCOUNTER — Other Ambulatory Visit (HOSPITAL_COMMUNITY)
Admission: RE | Admit: 2019-10-04 | Discharge: 2019-10-04 | Disposition: A | Discharge status: Home / Self Care | Payer: Managed Care, Other (non HMO) | Source: Ambulatory Visit | Attending: Obstetrics & Gynecology | Admitting: Obstetrics & Gynecology

## 2019-10-04 ENCOUNTER — Encounter: Payer: Self-pay | Admitting: Obstetrics & Gynecology

## 2019-10-04 VITALS — BP 130/70 | HR 80 | Temp 98.1°F | Resp 10 | Ht 63.0 in | Wt 176.0 lb

## 2019-10-04 DIAGNOSIS — Z124 Encounter for screening for malignant neoplasm of cervix: Secondary | ICD-10-CM

## 2019-10-04 DIAGNOSIS — Z0000 Encounter for general adult medical examination without abnormal findings: Secondary | ICD-10-CM

## 2019-10-04 DIAGNOSIS — Z01419 Encounter for gynecological examination (general) (routine) without abnormal findings: Secondary | ICD-10-CM

## 2019-10-04 DIAGNOSIS — Z87410 Personal history of cervical dysplasia: Secondary | ICD-10-CM

## 2019-10-04 NOTE — Progress Notes (Signed)
44 y.o. G69P2 Married White or Caucasian female here for annual exam.  Denies vaginal bleeding.  Doing well.  Husband is still in treatment but stable at this time.  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  Now doing restaurant inspection and traveling as far away as [place].  Tries to not be away from home overnight so does have some longer days but doesn't mind the driving.  Patient's last menstrual period was 08/29/2005 (approximate).          Sexually active: No.  The current method of family planning is status post hysterectomy.    Exercising: Yes.    walking Smoker:  no  Health Maintenance: Pap:  11/29/16 Neg              11/09/15 Neg. HR HPV:neg  History of abnormal Pap:  yes MMG:  05/14/18 Bilateral diagnostic with left [place] Left BIRADS1:Neg. Colonoscopy:  never BMD:   n/a TDaP:  04/15/2011 Pneumonia vaccine(s):  n/a Shingrix:   no Hep C testing: n/a Screening Labs: PCP   reports that she has never smoked. She has never used smokeless tobacco. She reports previous alcohol use. She reports that she does not use drugs.  Past Medical History:  Diagnosis Date  . Anxiety   . Asthma   . Basal cell carcinoma of skin    Dr. Derrel Nip  . Fibrocystic breast changes 08/27/2014   No hx breast biopsy. No FH first degree relative with breast or ovarian ca. Getting yearly mammograms per prior PCP recs.   Marland Kitchen PVC (premature ventricular contraction)    negative cardiac work up    Past Surgical History:  Procedure Laterality Date  . LAPAROSCOPIC HYSTERECTOMY  2007   for cervical dysplasia    Current Outpatient Medications  Medication Sig Dispense Refill  . albuterol (PROVENTIL HFA;VENTOLIN HFA) 108 (90 Base) MCG/ACT inhaler Inhale 2 puffs into the lungs 2 (two) times daily as needed. 3.7 g 6  . ALPRAZolam (XANAX) 0.5 MG tablet Take 1 tablet (0.5 mg total) by mouth 3 (three) times daily as needed. 180 tablet 0  . Cholecalciferol (VITAMIN D PO) Take by  mouth daily.    Marland Kitchen loratadine (CLARITIN) 10 MG tablet Take 10 mg by mouth daily.    . sertraline (ZOLOFT) 100 MG tablet Take 1.5 tablets (150 mg total) by mouth daily. 135 tablet 1   No current facility-administered medications for this visit.    Family History  Problem Relation Age of Onset  . Lung cancer Father        smoker and worked in [employer]  . Asthma Mother   . Anxiety disorder Mother   . Post-traumatic stress disorder Mother   . Anxiety disorder Sister   . Obsessive Compulsive Disorder Son   . Anxiety disorder Sister   . Suicidality Brother        gambling died by suicide    Review of Systems  All other systems reviewed and are negative.   Exam:   BP 130/70 (BP Location: Right Arm, Patient Position: Sitting, Cuff Size: Normal)   Pulse 80   Temp 98.1 F (36.7 C) (Temporal)   Resp 10   Ht 5\' 3"  (1.6 m)   Wt 176 lb (79.8 kg)   LMP 08/29/2005 (Approximate)   BMI 31.18 kg/m     Height: 5\' 3"  (160 cm)  Ht Readings from Last 3 Encounters:  10/04/19 5\' 3"  (1.6 m)  08/10/19 5\' 3"  (  1.6 m)  08/14/18 5\' 3"  (1.6 m)    General appearance: alert, cooperative and appears stated age Head: Normocephalic, without obvious abnormality, atraumatic Neck: no adenopathy, supple, symmetrical, trachea midline and thyroid normal to inspection and palpation Lungs: clear to auscultation bilaterally Breasts: normal appearance, no masses or tenderness Heart: regular rate and rhythm Abdomen: soft, non-tender; bowel sounds normal; no masses,  no organomegaly Extremities: extremities normal, atraumatic, no cyanosis or edema Skin: Skin color, texture, turgor normal. No rashes or lesions Lymph nodes: Cervical, supraclavicular, and axillary nodes normal. No abnormal inguinal nodes palpated Neurologic: Grossly normal   Pelvic: External genitalia:  no lesions              Urethra:  normal appearing urethra with no masses, tenderness or lesions              Bartholins and Skenes: normal                  Vagina: normal appearing vagina with normal color and discharge, no lesions              Cervix: absent              Pap taken: No. Bimanual Exam:  Uterus:  uterus absent              Adnexa: no mass, fullness, tenderness               Rectovaginal: Confirms               Anus:  normal sphincter tone, no lesions  Chaperone, Terence Lux, CMA, was present for exam.  A:  Well Woman with normal exam H/o LAVH 4/06 due to cervical dysplasia (CIN 1-3 on final pathology) H/o elevated lipids H/o left hemangioma Social stressors/anxiety, on sertraline   P:   Mammogram guidelines reviewed Colonoscopy guidelines reviewed as well pap smear with HR HPV obtained today.  2020 guidelines reviewed.  If neg/neg will need repeat in 3 years She is going to return for fasting last work.  Orders placed for CBC, CMP, Lipids, TSH, Vit D Return annually or prn

## 2019-10-06 ENCOUNTER — Encounter: Payer: Self-pay | Admitting: Obstetrics & Gynecology

## 2019-10-08 LAB — CYTOLOGY - PAP
Comment: NEGATIVE
Diagnosis: NEGATIVE
High risk HPV: NEGATIVE

## 2019-10-18 ENCOUNTER — Other Ambulatory Visit: Payer: Self-pay

## 2019-10-21 ENCOUNTER — Other Ambulatory Visit (INDEPENDENT_AMBULATORY_CARE_PROVIDER_SITE_OTHER): Payer: Managed Care, Other (non HMO)

## 2019-10-21 ENCOUNTER — Other Ambulatory Visit: Payer: Self-pay

## 2019-10-21 DIAGNOSIS — Z0000 Encounter for general adult medical examination without abnormal findings: Secondary | ICD-10-CM

## 2019-10-22 LAB — COMPREHENSIVE METABOLIC PANEL
ALT: 18 IU/L (ref 0–32)
AST: 17 IU/L (ref 0–40)
Albumin/Globulin Ratio: 1.7 (ref 1.2–2.2)
Albumin: 4.3 g/dL (ref 3.8–4.8)
Alkaline Phosphatase: 43 IU/L (ref 39–117)
BUN/Creatinine Ratio: 18 (ref 9–23)
BUN: 15 mg/dL (ref 6–24)
Bilirubin Total: 0.4 mg/dL (ref 0.0–1.2)
CO2: 21 mmol/L (ref 20–29)
Calcium: 9.4 mg/dL (ref 8.7–10.2)
Chloride: 104 mmol/L (ref 96–106)
Creatinine, Ser: 0.83 mg/dL (ref 0.57–1.00)
GFR calc Af Amer: 98 mL/min/{1.73_m2} (ref >59)
GFR calc non Af Amer: 85 mL/min/{1.73_m2} (ref >59)
Globulin, Total: 2.6 g/dL (ref 1.5–4.5)
Glucose: 83 mg/dL (ref 65–99)
Potassium: 4.6 mmol/L (ref 3.5–5.2)
Sodium: 139 mmol/L (ref 134–144)
Total Protein: 6.9 g/dL (ref 6.0–8.5)

## 2019-10-22 LAB — LIPID PANEL
Chol/HDL Ratio: 3.4 ratio (ref 0.0–4.4)
Cholesterol, Total: 219 mg/dL — ABNORMAL HIGH (ref 100–199)
HDL: 64 mg/dL (ref >39)
LDL Chol Calc (NIH): 143 mg/dL — ABNORMAL HIGH (ref 0–99)
Triglycerides: 71 mg/dL (ref 0–149)
VLDL Cholesterol Cal: 12 mg/dL (ref 5–40)

## 2019-10-22 LAB — CBC
Hematocrit: 42.5 % (ref 34.0–46.6)
Hemoglobin: 14.5 g/dL (ref 11.1–15.9)
MCH: 29.1 pg (ref 26.6–33.0)
MCHC: 34.1 g/dL (ref 31.5–35.7)
MCV: 85 fL (ref 79–97)
Platelets: 332 10*3/uL (ref 150–450)
RBC: 4.99 x10E6/uL (ref 3.77–5.28)
RDW: 12.6 % (ref 11.7–15.4)
WBC: 6.9 10*3/uL (ref 3.4–10.8)

## 2019-10-22 LAB — TSH: TSH: 2.27 u[IU]/mL (ref 0.450–4.500)

## 2019-10-22 LAB — VITAMIN D 25 HYDROXY (VIT D DEFICIENCY, FRACTURES): Vit D, 25-Hydroxy: 23.1 ng/mL — ABNORMAL LOW (ref 30.0–100.0)

## 2019-11-10 ENCOUNTER — Other Ambulatory Visit: Payer: Self-pay | Admitting: Psychiatry

## 2019-11-10 DIAGNOSIS — F4001 Agoraphobia with panic disorder: Secondary | ICD-10-CM

## 2019-12-10 ENCOUNTER — Ambulatory Visit: Payer: 59 | Admitting: Psychiatry

## 2020-01-06 ENCOUNTER — Other Ambulatory Visit: Payer: Self-pay

## 2020-01-06 ENCOUNTER — Encounter: Payer: Self-pay | Admitting: Psychiatry

## 2020-01-06 ENCOUNTER — Ambulatory Visit (INDEPENDENT_AMBULATORY_CARE_PROVIDER_SITE_OTHER): Payer: BC Managed Care – PPO | Admitting: Psychiatry

## 2020-01-06 DIAGNOSIS — F5105 Insomnia due to other mental disorder: Secondary | ICD-10-CM

## 2020-01-06 DIAGNOSIS — F4001 Agoraphobia with panic disorder: Secondary | ICD-10-CM | POA: Diagnosis not present

## 2020-01-06 DIAGNOSIS — F411 Generalized anxiety disorder: Secondary | ICD-10-CM

## 2020-01-06 MED ORDER — SERTRALINE HCL 100 MG PO TABS: 150.0000 mg | ORAL_TABLET | Freq: Every day | ORAL | 1 refills | Status: DC

## 2020-01-06 NOTE — Progress Notes (Signed)
Alexandra Welch [id number] Oct 27, 1975 44 y.o.  Subjective:   Patient ID:  Alexandra Welch is a 44 y.o. (DOB Jun 22, 1976) female.  Chief Complaint:  No chief complaint on file.   HPI Alexandra Welch presents to the office today for follow-up of panic disorder, generalized anxiety disorder, and insomnia.  She was first seen on October 15, 2018.  visit January 28, 2019.  She was having continued panic attacks but they overall were much improved on sertraline 100 mg versus Lexapro.  However she was still having some nocturnal panic attacks as well so we decided to increase sertraline to 150 mg daily.  Last visit May 14, 2019.  She was still having 1-2 panic attacks on sertraline 150 mg daily but that was improved.  She was encouraged to increase it to 200 mg daily to try to completely suppress the panic but she chose not to do so.  Last seen Sep 10, 2019 with the following noted and no meds changed. Panic a little better about once per month.  Nocturnal only now.  NM are not the cause.    Better control of them but last 10-15 mins.  Not depressed generally.  Circumstantial anxiety noted.  Sleep 6 hours.  Awakes 2-3 AM and can be awake 1 1/2 hours.  Gets up 6 am.  Some tiredness in the afternoon.    H still on chemo and not doing well. and doesn't sleep well and that's stressful.  He's still not doing well and this causes anxiety.  He doesn't sleep well.  A few panic but less and can still be out of the blue and are usually those are nocturnal.  No consistent NM.  She is only having nocturnal panic attacks at this time.  Overrate all anxiety is okay with the exception of worry about her husband.  She does not sleep enough and has early morning awakening.  It is not always associated with anxiety.  Denies appetite disturbance.  Patient reports that energy and motivation have been good.  Patient denies any difficulty with concentration.  Patient denies any suicidal ideation.  01/06/20 appt with the  following noted: Still on sertaline 150 and Xanax 0.5 mg HS only usually.  Sleep is a little better than last time.  Overall good with less panic.  2-3 in 4 mos.  Situational anxiety.   H cancer is primary source of anxiety.  Also kids moving an having babies create anxiety but expected.  No avoidance bc most panic was nocturnal. No NM.  No depression normally.  No caffeine.  No history Alc drug problems.   Past Psychiatric Medication Trials:  Ativan tiredness, Lexapro, paxil years agoSE Trazodone hangover M takes Klonopin HS Sertraline 150  Review of Systems:  Review of Systems  Cardiovascular: Positive for palpitations.       Normal cardiac workup.  Palpitations with anxiety  Neurological: Negative for dizziness, tremors and weakness.    Medications: I have reviewed the patient's current medications.  Current Outpatient Medications  Medication Sig Dispense Refill  . albuterol (PROVENTIL HFA;VENTOLIN HFA) 108 (90 Base) MCG/ACT inhaler Inhale 2 puffs into the lungs 2 (two) times daily as needed. 3.7 g 6  . ALPRAZolam (XANAX) 0.5 MG tablet TAKE 1 TABLET(0.5 MG) BY MOUTH THREE TIMES DAILY AS NEEDED 90 tablet 1  . Cholecalciferol (VITAMIN D PO) Take by mouth daily.    Marland Kitchen loratadine (CLARITIN) 10 MG tablet Take 10 mg by mouth daily.    . sertraline (ZOLOFT)  100 MG tablet Take 1.5 tablets (150 mg total) by mouth daily. 135 tablet 1   No current facility-administered medications for this visit.    Medication Side Effects: None  Allergies:  Allergies  Allergen Reactions  . Shellfish Allergy Swelling    Past Medical History:  Diagnosis Date  . Anxiety   . Asthma   . Basal cell carcinoma of skin    Dr. Derrel Nip  . Fibrocystic breast changes 08/27/2014   No hx breast biopsy. No FH first degree relative with breast or ovarian ca. Getting yearly mammograms per prior PCP recs.   Marland Kitchen PVC (premature ventricular contraction)    negative cardiac work up    Family History  Problem  Relation Age of Onset  . Lung cancer Father        smoker and worked in [employer]  . Asthma Mother   . Anxiety disorder Mother   . Post-traumatic stress disorder Mother   . Anxiety disorder Sister   . Obsessive Compulsive Disorder Son   . Anxiety disorder Sister   . Suicidality Brother        gambling died by suicide    Social History   Socioeconomic History  . Marital status: Married    Spouse name: Pilar Plate  . Number of children: 2  . Years of education: Not on file  . Highest education level: Not on file  Occupational History  . Not on file  Tobacco Use  . Smoking status: Never Smoker  . Smokeless tobacco: Never Used  Substance and Sexual Activity  . Alcohol use: Not Currently  . Drug use: No  . Sexual activity: Yes    Partners: Male    Birth control/protection: Surgical    Comment: hysterectomy  Other Topics Concern  . Not on file  Social History Narrative   Work or School: Scientist, clinical (histocompatibility and immunogenetics) [employer]      Home Situation: lives with husband and two older children      Spiritual Beliefs: Baptist      Lifestyle: exercise is sporadic; diet is ok      Social Determinants of Radio broadcast assistant Welch:   . Difficulty of Paying Living Expenses:   Food Insecurity:   . Worried About Charity fundraiser in the Last Year:   . Arboriculturist in the Last Year:   Transportation Needs:   . Film/video editor (Medical):   Marland Kitchen Lack of Transportation (Non-Medical):   Physical Activity:   . Days of Exercise per Week:   . Minutes of Exercise per Session:   Stress:   . Feeling of Stress :   Social Connections:   . Frequency of Communication with Friends and Family:   . Frequency of Social Gatherings with Friends and Family:   . Attends Religious Services:   . Active Member of Clubs or Organizations:   . Attends Archivist Meetings:   Marland Kitchen Marital Status:   Intimate Partner Violence:   . Fear of Current or Ex-Partner:   . Emotionally Abused:   Marland Kitchen  Physically Abused:   . Sexually Abused:     Past Medical History, Surgical history, Social history, and Family history were reviewed and updated as appropriate.   Please see review of systems for further details on the patient's review from today.   Objective:   Physical Exam:  LMP 08/29/2005 (Approximate)   Physical Exam Constitutional:      General: She is not in acute distress.  Appearance: She is well-developed.  Musculoskeletal:        General: No deformity.  Neurological:     Mental Status: She is alert and oriented to person, place, and time.     Coordination: Coordination normal.  Psychiatric:        Attention and Perception: Attention normal. She does not perceive auditory hallucinations.        Mood and Affect: Mood is anxious. Mood is not depressed. Affect is not labile, blunt, angry or inappropriate.        Speech: Speech normal.        Behavior: Behavior normal.        Thought Content: Thought content normal. Thought content does not include homicidal or suicidal ideation. Thought content does not include homicidal or suicidal plan.        Cognition and Memory: Cognition normal.        Judgment: Judgment normal.     Comments: Insight intact. No auditory or visual hallucinations. No delusions.  Affect is a little anxious as well as mood.     Lab Review:     Component Value Date/Time   NA 139 10/21/2019 0843   K 4.6 10/21/2019 0843   CL 104 10/21/2019 0843   CO2 21 10/21/2019 0843   GLUCOSE 83 10/21/2019 0843   GLUCOSE 78 11/29/2016 1413   BUN 15 10/21/2019 0843   CREATININE 0.83 10/21/2019 0843   CREATININE 0.80 11/29/2016 1413   CALCIUM 9.4 10/21/2019 0843   PROT 6.9 10/21/2019 0843   ALBUMIN 4.3 10/21/2019 0843   AST 17 10/21/2019 0843   ALT 18 10/21/2019 0843   ALKPHOS 43 10/21/2019 0843   BILITOT 0.4 10/21/2019 0843   GFRNONAA 85 10/21/2019 0843   GFRAA 98 10/21/2019 0843       Component Value Date/Time   WBC 6.9 10/21/2019 0843   WBC  6.2 08/12/2013 0857   RBC 4.99 10/21/2019 0843   RBC 4.53 08/12/2013 0857   HGB 14.5 10/21/2019 0843   HCT 42.5 10/21/2019 0843   PLT 332 10/21/2019 0843   MCV 85 10/21/2019 0843   MCH 29.1 10/21/2019 0843   MCHC 34.1 10/21/2019 0843   MCHC 34.1 08/12/2013 0857   RDW 12.6 10/21/2019 0843   LYMPHSABS 1.9 08/12/2013 0857   MONOABS 0.5 08/12/2013 0857   EOSABS 0.3 08/12/2013 0857   BASOSABS 0.0 08/12/2013 0857    No results found for: POCLITH, LITHIUM   No results found for: PHENYTOIN, PHENOBARB, VALPROATE, CBMZ   .res Assessment: Plan:    Panic disorder with agoraphobia - Plan: sertraline (ZOLOFT) 100 MG tablet  Generalized anxiety disorder - Plan: sertraline (ZOLOFT) 100 MG tablet  Insomnia due to mental condition   Greater than 50% of 30 min face to face time with patient was spent on counseling and coordination of care.  She believes the major source of her anxiety is her husband's serious cancer.  But she can have nocturnal panic attacks that wake her out of sleep for no apparent reason.  These are the only type of panic attack she is having currently.  We discussed the pros and cons of going up in the sertraline but that clearly makes sense to try to further suppress especially the spontaneous panic attacks.  Overall these are reduced to once monthly.  She's satisfied.  She decided to continue the current dose of sertraline 150 mg daily  Option increase sertraline increase.  We discussed the pros and cons of the Xanax usage and  she is not using it excessively.  Is hoped that she will be able to gradually discontinue that except situationally.  She does have some significant situational anxiety over the health of her husband.  Supportive therapy around that issue.   Patient's insomnia failed to respond to trazodone because of hangover. Sleep better with Xanax She has continued early morning awakening and decreased quantity of sleep.  This is probably contributing to her not  feeling as well during the day.  Discussed at length the pros and cons of using the Xanax on a consistent basis at night.  She has a family history of chronic insomnia.  We discussed her absence of history of drug and alcohol abuse.  We discussed the tolerance issues around benzodiazepines when used chronically for sleep but they can be used successfully at times as is the case with her mother.  We discussed withdrawal symptoms.  It is unlikely that sleep agents that do not address anxiety will help her sleep.  Therefore recommend trial of Xanax 0.25 to 0.5 mg nightly on a consistent basis both to block the nocturnal panic attacks and to treat the insomnia.  Follow-up 6 months  Hiram Comber, MD, DFAPA  Please see After Visit Summary for patient specific instructions.  Future Appointments  Date Time Provider [facility]  01/01/2021  1:30 PM Megan Salon, MD [facility] None    No orders of the defined types were placed in this encounter.     -------------------------------

## 2020-01-08 DIAGNOSIS — Z20822 Contact with and (suspected) exposure to covid-19: Secondary | ICD-10-CM | POA: Diagnosis not present

## 2020-05-07 ENCOUNTER — Other Ambulatory Visit: Payer: Self-pay | Admitting: Psychiatry

## 2020-05-07 DIAGNOSIS — F4001 Agoraphobia with panic disorder: Secondary | ICD-10-CM

## 2020-05-11 IMAGING — MG DIGITAL DIAGNOSTIC BILATERAL MAMMOGRAM WITH TOMO AND CAD
6 of 10 series · 6 of 30 positions shown · non-contrast
Comparison: Previous exam(s).

CLINICAL DATA: 42-year-old female with a possible left breast lump
for approximately 2 weeks.

EXAM:
DIGITAL DIAGNOSTIC BILATERAL MAMMOGRAM WITH CAD AND TOMO
ULTRASOUND LEFT BREAST

[R CC synth-2D]
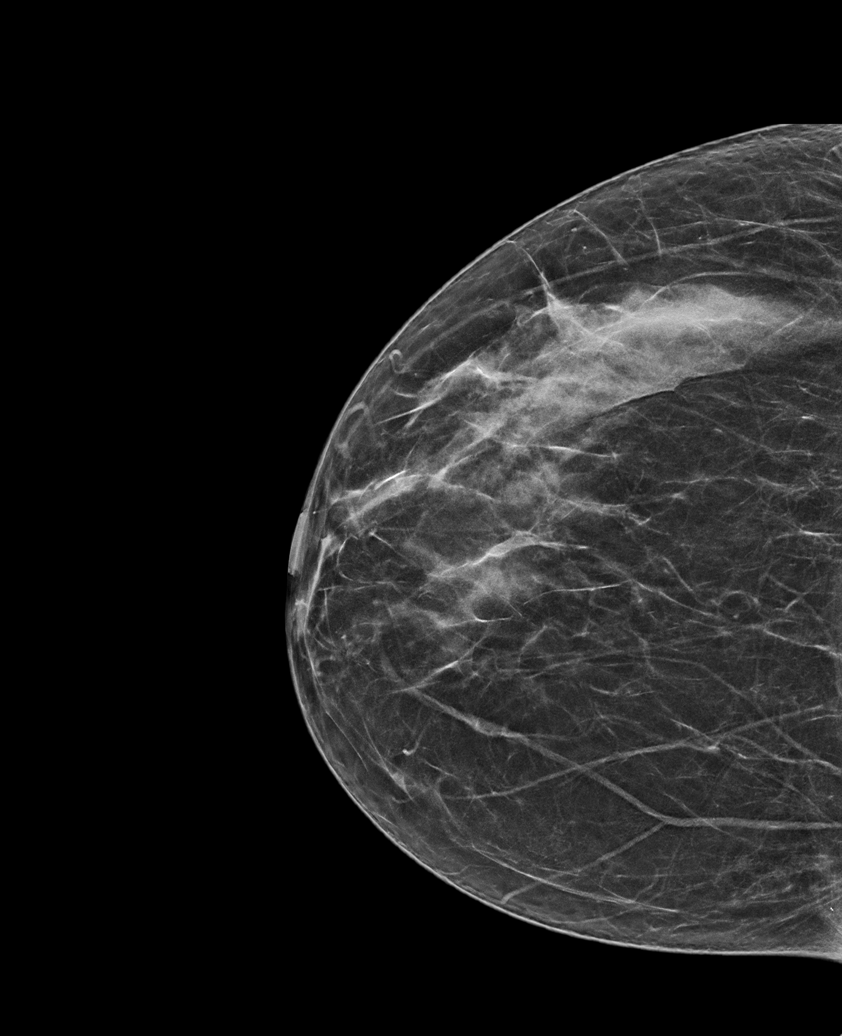

[L CC synth-2D]
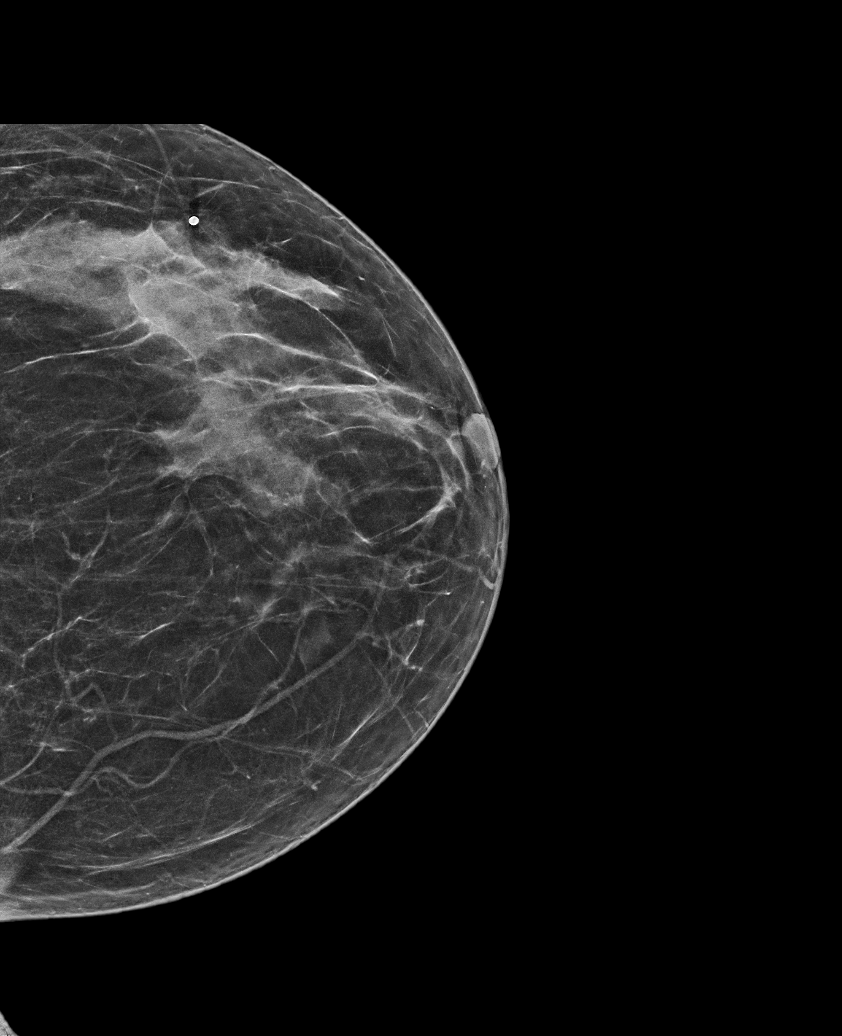

[L MLO synth-2D]
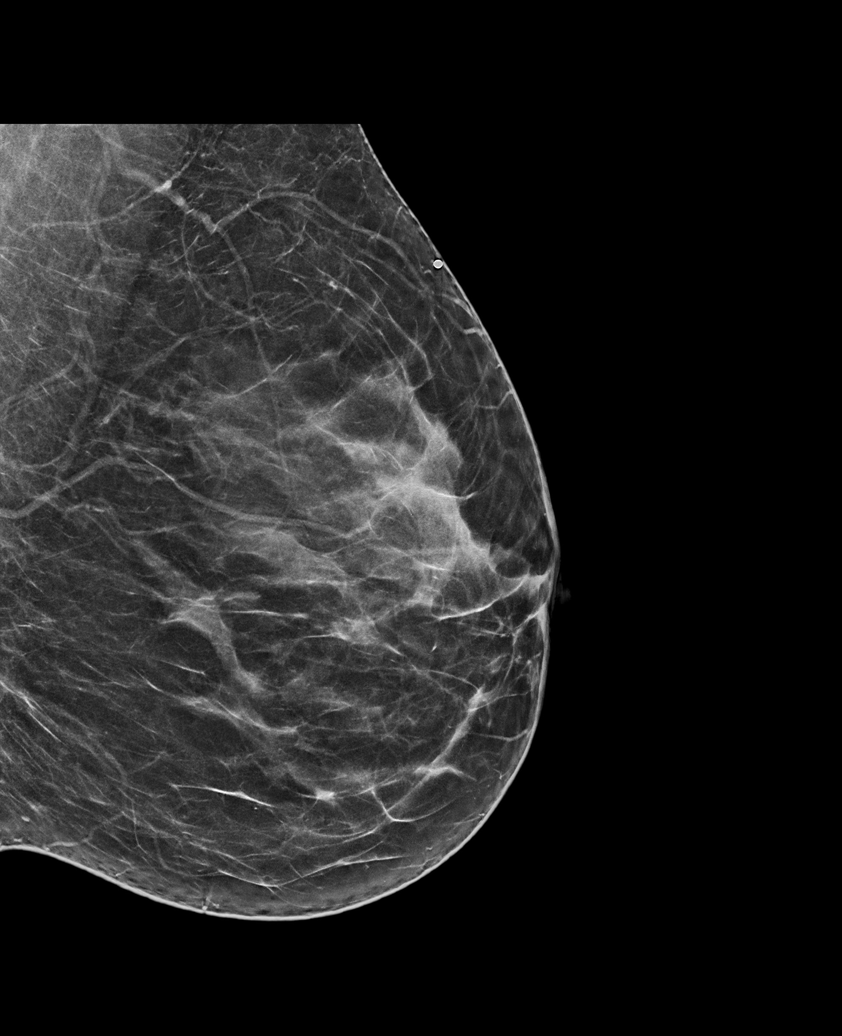

[L TAN synth-2D]
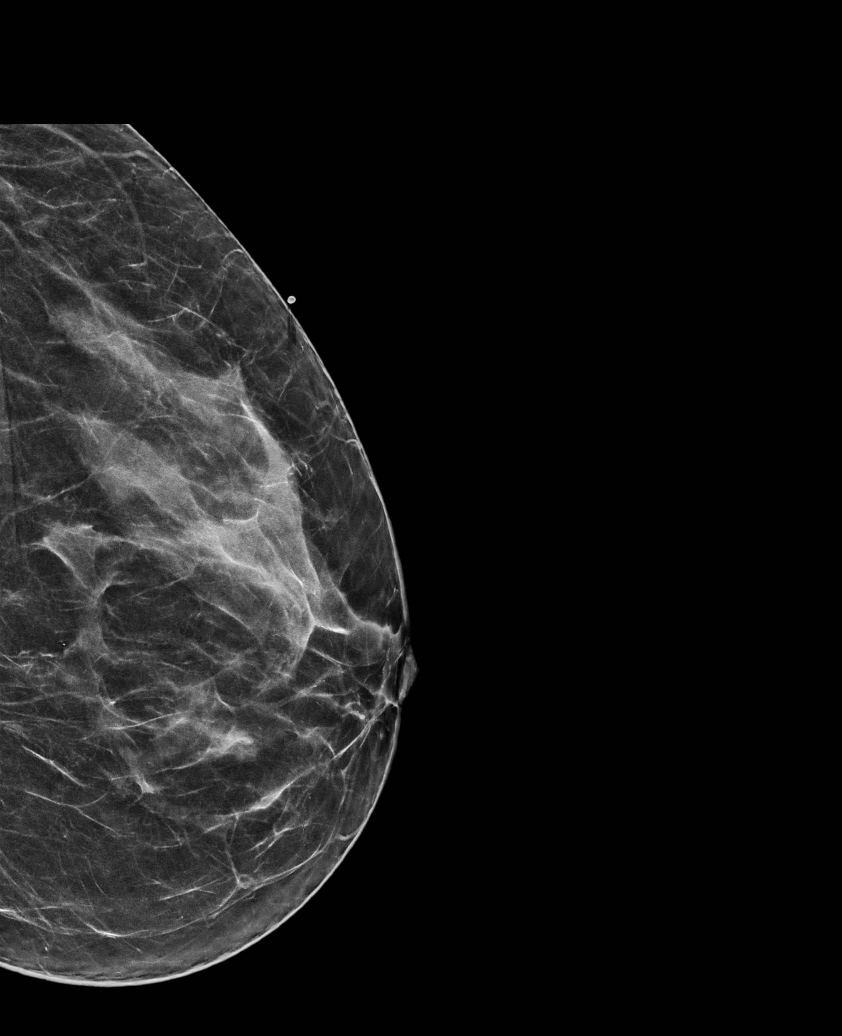

[R MLO synth-2D]
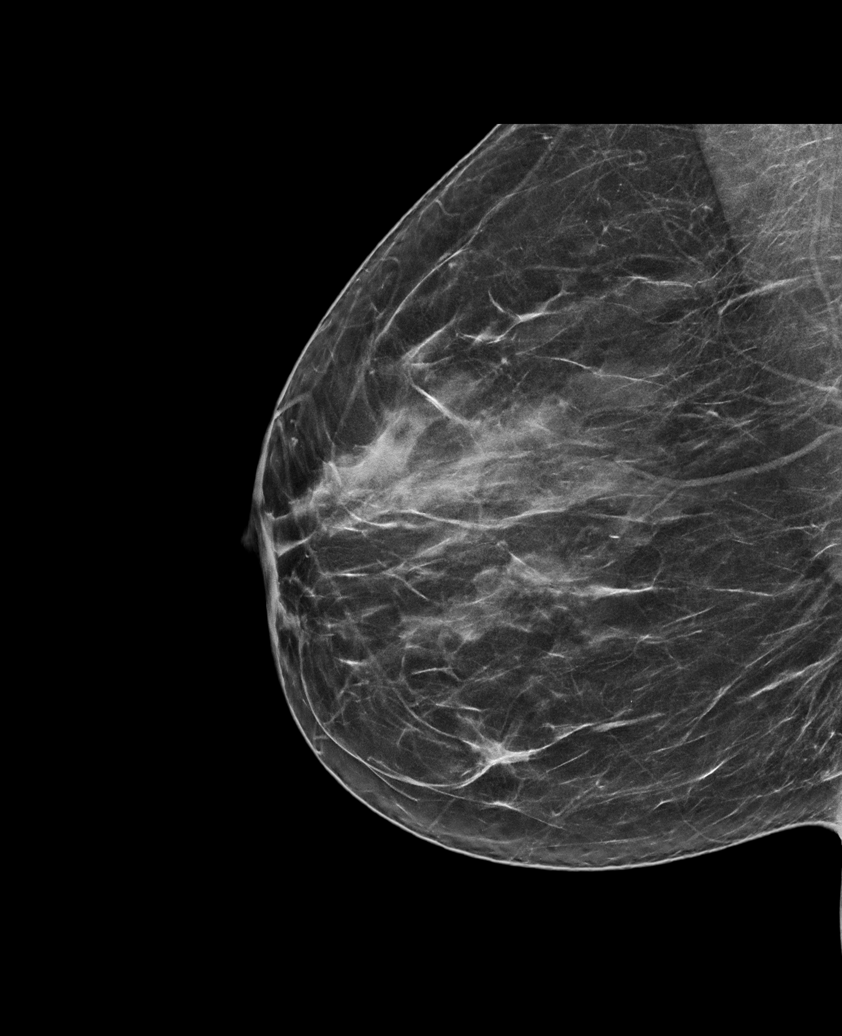

[L TAN tomo · tomo slice 33/65.0]
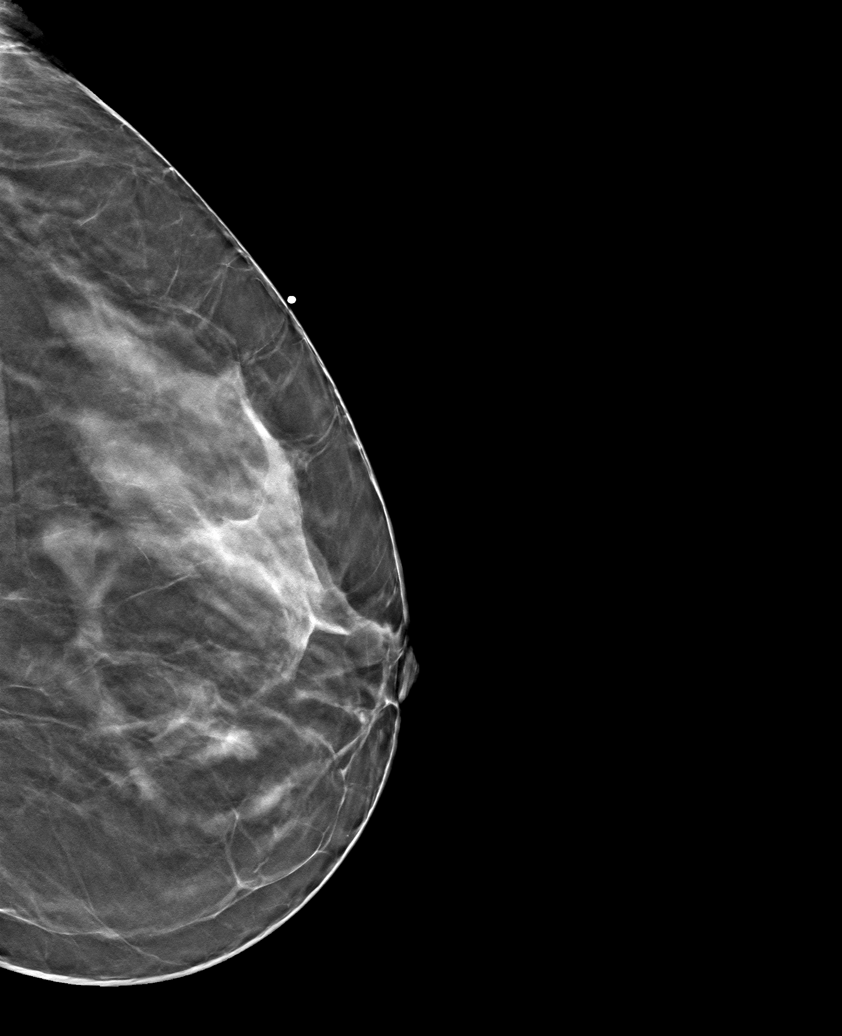

[6 of 30 positions shown; findings below may reference images not displayed]

ACR Breast Density Category c: The breast tissue is heterogeneously
dense, which may obscure small masses.
FINDINGS: A radiopaque BB was placed at the site of the patient's palpable
lump in the upper outer left breast. No suspicious findings are seen
deep to the radiopaque BB or within the remainder of either breast.
The parenchymal pattern is stable.

Mammographic images were processed with CAD.

On physical exam, I palpate heterogenous tissue without focal
suspicious lump in the upper outer left breast.

Targeted ultrasound is performed, showing normal fibroglandular
tissue without focal or suspicious sonographic finding. Evaluation
of the upper and lateral left breast was performed.
IMPRESSION: 1. No mammographic evidence of malignancy in either breast.
2. No additional suspicious sonographic findings corresponding with
the patient's left breast lump.

RECOMMENDATION:
1. Clinical follow-up recommended for the palpable area of concern
in the left breast. Any further workup should be based on clinical
grounds.
2.  Screening mammogram in one year.(Code:T5-P-ID0)

I have discussed the findings and recommendations with the patient.
Results were also provided in writing at the conclusion of the
visit. If applicable, a reminder letter will be sent to the patient
regarding the next appointment.

BI-RADS CATEGORY  1: Negative.

## 2020-05-29 ENCOUNTER — Ambulatory Visit: Payer: Self-pay

## 2020-05-29 DIAGNOSIS — J029 Acute pharyngitis, unspecified: Secondary | ICD-10-CM | POA: Diagnosis not present

## 2020-06-02 DIAGNOSIS — H1033 Unspecified acute conjunctivitis, bilateral: Secondary | ICD-10-CM | POA: Diagnosis not present

## 2020-06-04 ENCOUNTER — Telehealth: Payer: Self-pay

## 2020-06-04 NOTE — Telephone Encounter (Signed)
Pt request Albuterol inhaler sent to [facility].

## 2020-06-05 ENCOUNTER — Encounter: Payer: Self-pay | Admitting: Family Medicine

## 2020-06-05 ENCOUNTER — Telehealth (INDEPENDENT_AMBULATORY_CARE_PROVIDER_SITE_OTHER): Payer: BLUE CROSS/BLUE SHIELD | Admitting: Family Medicine

## 2020-06-05 VITALS — Temp 97.8°F | Wt 168.0 lb

## 2020-06-05 DIAGNOSIS — J0110 Acute frontal sinusitis, unspecified: Secondary | ICD-10-CM

## 2020-06-05 DIAGNOSIS — J4520 Mild intermittent asthma, uncomplicated: Secondary | ICD-10-CM

## 2020-06-05 DIAGNOSIS — R059 Cough, unspecified: Secondary | ICD-10-CM

## 2020-06-05 MED ORDER — AMOXICILLIN 500 MG PO TABS: 500.0000 mg | ORAL_TABLET | Freq: Two times a day (BID) | ORAL | 0 refills | Status: DC

## 2020-06-05 MED ORDER — BENZONATATE 100 MG PO CAPS: 100.0000 mg | ORAL_CAPSULE | Freq: Two times a day (BID) | ORAL | 0 refills | Status: DC | PRN

## 2020-06-05 MED ORDER — FLUTICASONE PROPIONATE 50 MCG/ACT NA SUSP: 1.0000 | Freq: Every day | NASAL | 0 refills | Status: DC

## 2020-06-05 MED ORDER — ALBUTEROL SULFATE HFA 108 (90 BASE) MCG/ACT IN AERS: 2.0000 | INHALATION_SPRAY | Freq: Two times a day (BID) | RESPIRATORY_TRACT | 1 refills | Status: DC | PRN

## 2020-06-05 NOTE — Progress Notes (Signed)
Virtual Visit via Video Note  I connected with Alexandra Welch (pronounced "Alish-a") on Jun 19, 2020 at 11:30 AM EDT by a video enabled telemedicine application 2/2 [id number] pandemic and verified that I am speaking with the correct person using two identifiers.  Location patient: home Location provider:work or home office Persons participating in the virtual visit: patient, provider, pt's infant grandson who is sleeping in pt's arms.  I discussed the limitations of evaluation and management by telemedicine and the availability of in person appointments. The patient expressed understanding and agreed to proceed.   HPI: Pt is a 44 yo female with pmh sig for mild intermittent asthma,  H/o basal cell carcinoma of skin, h/o anxiety followed by Dr. Maudie Mercury, seen for acute concern.  Pt with rinorrhea, sore throat, body aches, and cough x 10 days.  Cough worse at night causing soreness in chest.  Pt also notes HA that comes and goes, L ear pain/pressure, and L frontal pain/pressure.  Pt recently dx'd with "double pink eye" on Monday by eye dr., given abx gtts which are helping.  Denies fever, n/v, diarrhea, constipation.  Pt's son and her fiance had bronchitis last wk.  Pt tried tylenol, mucinex severe congestion/sinus, and dayquil.  Patient requesting refill on albuterol inhaler.  ROS: See pertinent positives and negatives per HPI.  Past Medical History:  Diagnosis Date  . Anxiety   . Asthma   . Basal cell carcinoma of skin    Dr. Derrel Nip  . Fibrocystic breast changes 08/27/2014   No hx breast biopsy. No FH first degree relative with breast or ovarian ca. Getting yearly mammograms per prior PCP recs.   Marland Kitchen PVC (premature ventricular contraction)    negative cardiac work up    Past Surgical History:  Procedure Laterality Date  . LAPAROSCOPIC HYSTERECTOMY  2007   for cervical dysplasia    Family History  Problem Relation Age of Onset  . Lung cancer Father        smoker and worked in [employer]   . Asthma Mother   . Anxiety disorder Mother   . Post-traumatic stress disorder Mother   . Anxiety disorder Sister   . Obsessive Compulsive Disorder Son   . Anxiety disorder Sister   . Suicidality Brother        gambling died by suicide     Current Outpatient Medications:  .  albuterol (PROVENTIL HFA;VENTOLIN HFA) 108 (90 Base) MCG/ACT inhaler, Inhale 2 puffs into the lungs 2 (two) times daily as needed., Disp: 3.7 g, Rfl: 6 .  ALPRAZolam (XANAX) 0.5 MG tablet, TAKE 1 TABLET BY MOUTH THREE TIMES DAILY AS NEEDED FOR ANXIETY, Disp: 90 tablet, Rfl: 1 .  Cholecalciferol (VITAMIN D PO), Take by mouth daily., Disp: , Rfl:  .  loratadine (CLARITIN) 10 MG tablet, Take 10 mg by mouth daily., Disp: , Rfl:  .  sertraline (ZOLOFT) 100 MG tablet, Take 1.5 tablets (150 mg total) by mouth daily., Disp: 135 tablet, Rfl: 1  EXAM:  VITALS per patient if applicable: RR 60-10 bpm  GENERAL: alert, oriented, appears well and in no acute distress  HEENT: atraumatic, conjunctiva clear, no obvious abnormalities on inspection of external nose and ears  NECK: normal movements of the head and neck  LUNGS: Intermittent dry cough. On inspection no signs of respiratory distress, breathing rate appears normal, no obvious gross SOB, gasping or wheezing  CV: no obvious cyanosis  MS: moves all visible extremities without noticeable abnormality  PSYCH/NEURO: pleasant and cooperative, no obvious depression  or anxiety, speech and thought processing grossly intact  ASSESSMENT AND PLAN:  Discussed the following assessment and plan:  Subacute frontal sinusitis  -Continue supportive care including Tylenol and NSAIDs as needed -Discussed using Flonase nasal spray - Plan: fluticasone (FLONASE) 50 MCG/ACT nasal spray, amoxicillin (AMOXIL) 500 MG tablet  Mild intermittent asthma without complication  -Likely exacerbated by ongoing URI/postnasal drainage -Continue supportive care -Albuterol inhaler every 4 as  needed - Plan: albuterol (VENTOLIN HFA) 108 (90 Base) MCG/ACT inhaler  Cough  -likely from postnasal drainage and URI - Plan: benzonatate (TESSALON) 100 MG capsule  F/u as needed for continued or worsening symptoms.   I discussed the assessment and treatment plan with the patient. The patient was provided an opportunity to ask questions and all were answered. The patient agreed with the plan and demonstrated an understanding of the instructions.   The patient was advised to call back or seek an in-person evaluation if the symptoms worsen or if the condition fails to improve as anticipated.   Billie Ruddy, MD

## 2020-06-07 ENCOUNTER — Other Ambulatory Visit: Payer: Self-pay

## 2020-06-07 ENCOUNTER — Emergency Department
Admission: RE | Admit: 2020-06-07 | Discharge: 2020-06-07 | Disposition: A | Discharge status: Home / Self Care | Payer: BLUE CROSS/BLUE SHIELD | Source: Ambulatory Visit | Attending: Family Medicine | Admitting: Family Medicine

## 2020-06-07 ENCOUNTER — Emergency Department (INDEPENDENT_AMBULATORY_CARE_PROVIDER_SITE_OTHER): Payer: BLUE CROSS/BLUE SHIELD

## 2020-06-07 VITALS — BP 124/88 | HR 104 | Temp 99.7°F | Resp 18 | Ht 63.0 in | Wt 168.0 lb

## 2020-06-07 DIAGNOSIS — R053 Chronic cough: Secondary | ICD-10-CM

## 2020-06-07 DIAGNOSIS — R059 Cough, unspecified: Secondary | ICD-10-CM

## 2020-06-07 DIAGNOSIS — J209 Acute bronchitis, unspecified: Secondary | ICD-10-CM | POA: Diagnosis not present

## 2020-06-07 LAB — POCT CBC W AUTO DIFF (K'VILLE URGENT CARE)

## 2020-06-07 MED ORDER — PREDNISONE 20 MG PO TABS: ORAL_TABLET | ORAL | 0 refills | Status: DC

## 2020-06-07 MED ORDER — GUAIFENESIN-CODEINE 100-10 MG/5ML PO SOLN: ORAL | 0 refills | Status: DC

## 2020-06-07 MED ORDER — DOXYCYCLINE HYCLATE 100 MG PO CAPS: ORAL_CAPSULE | ORAL | 0 refills | Status: DC

## 2020-06-07 NOTE — Discharge Instructions (Addendum)
Take plain guaifenesin (1200mg  extended release tabs such as Mucinex) twice daily, with plenty of water, for cough and congestion.  If needed, may add Pseudoephedrine (30mg , one or two every 4 to 6 hours) for sinus congestion.  Get adequate rest.   Stop amoxicillin, Tessalon, Daquil, and Claritin. Use albuterol inhaler as needed. Stop all antihistamines for now, and other non-prescription cough/cold preparations. Recommend a Tdap when well.  Follow-up with family doctor if not improving about10 days.

## 2020-06-07 NOTE — ED Triage Notes (Signed)
Patient here for cough lasting 8-9 days; cannot sleep at night; tried OTCs without relief; Has not started covid vaccination regime but did test 8 days ago and it was negative; no known fever.

## 2020-06-07 NOTE — ED Triage Notes (Signed)
On amoxicillin and tessalon per tele visit.

## 2020-06-07 NOTE — ED Provider Notes (Signed)
[facility]    CSN: [id number] Arrival date & time: 06/07/20  4401      History   Chief Complaint Chief Complaint  Patient presents with  . Cough    HPI Alexandra Welch is a 44 y.o. female.   Patient developed cold-like illness two weeks ago with initial sore throat, nasal congestion, headache, and non-productive cough.  The cough has persisted and become worse.  She believes that she hears wheezing in her left chest, and she often coughs until she gags.  She has developed chills during the past 2 to 3 days.  She was recently prescribed amoxicillin and Tessalon during a telemed visit, but has had no improvement.  She has not yet had COVID vaccinations.  She denies changes in taste/smell. She has a past history of exercise asthma, and a family history of asthma (mother and sister).  The history is provided by the patient.    Past Medical History:  Diagnosis Date  . Anxiety   . Asthma   . Basal cell carcinoma of skin    Dr. Derrel Nip  . Fibrocystic breast changes 08/27/2014   No hx breast biopsy. No FH first degree relative with breast or ovarian ca. Getting yearly mammograms per prior PCP recs.   Marland Kitchen PVC (premature ventricular contraction)    negative cardiac work up    Patient Active Problem List   Diagnosis Date Noted  . Mild intermittent asthma 08/27/2014  . Fibrocystic breast changes 08/27/2014  . Anxiety state 11/29/2007    Past Surgical History:  Procedure Laterality Date  . LAPAROSCOPIC HYSTERECTOMY  2007   for cervical dysplasia    OB History    Gravida  5   Para  2   Term      Preterm      AB      Living  2     SAB      TAB      Ectopic      Multiple      Live Births               Home Medications    Prior to Admission medications   Medication Sig Start Date End Date Taking? Authorizing Provider  albuterol (VENTOLIN HFA) 108 (90 Base) MCG/ACT inhaler Inhale 2 puffs into the lungs 2 (two) times daily as needed.  06/05/20   Billie Ruddy, MD  ALPRAZolam Duanne Moron) 0.5 MG tablet TAKE 1 TABLET BY MOUTH THREE TIMES DAILY AS NEEDED FOR ANXIETY 05/11/20   Cottle, Billey Co., MD  Cholecalciferol (VITAMIN D PO) Take by mouth daily.    [provider]  doxycycline (VIBRAMYCIN) 100 MG capsule Take one tab PO Q12hr with food. 06/07/20   Kandra Nicolas, MD  fluticasone (FLONASE) 50 MCG/ACT nasal spray Place 1 spray into both nostrils daily. 06/05/20   Billie Ruddy, MD  guaiFENesin-codeine 100-10 MG/5ML syrup Take 68mL by mouth at bedtime as needed for cough. 06/07/20   Kandra Nicolas, MD  loratadine (CLARITIN) 10 MG tablet Take 10 mg by mouth daily.    [provider]  predniSONE (DELTASONE) 20 MG tablet Take one tab by mouth twice daily for 4 days, then one daily for 3 days. Take with food. 06/07/20   Kandra Nicolas, MD  sertraline (ZOLOFT) 100 MG tablet Take 1.5 tablets (150 mg total) by mouth daily. 01/06/20   Cottle, Billey Co., MD    Family History Family History  Problem Relation  Age of Onset  . Lung cancer Father        smoker and worked in [employer]  . Asthma Mother   . Anxiety disorder Mother   . Post-traumatic stress disorder Mother   . Anxiety disorder Sister   . Obsessive Compulsive Disorder Son   . Anxiety disorder Sister   . Suicidality Brother        gambling died by suicide    Social History Social History   Tobacco Use  . Smoking status: Never Smoker  . Smokeless tobacco: Never Used  Vaping Use  . Vaping Use: Never used  Substance Use Topics  . Alcohol use: Not Currently  . Drug use: No     Allergies   Shellfish allergy   Review of Systems Review of Systems  + sore throat, resolved + cough No pleuritic pain ? wheezing + nasal congestion, resolved No post-nasal drainage No sinus pain/pressure No itchy/red eyes No earache No hemoptysis + SOB with activity No fever/chills No nausea No vomiting No abdominal pain No diarrhea No urinary  symptoms No skin rash + fatigue No myalgias + headache Used OTC meds (Daquil, Mucinex Severe Cough/Congestion) without relief    Physical Exam Triage Vital Signs ED Triage Vitals  Enc Vitals Group     BP 06/07/20 1008 124/88     Pulse Rate 06/07/20 1008 (!) 104     Resp 06/07/20 1008 18     Temp 06/07/20 1008 99.7 F (37.6 C)     Temp src --      SpO2 06/07/20 1008 98 %     Weight 06/07/20 1009 168 lb (76.2 kg)     Height 06/07/20 1009 5\' 3"  (1.6 m)     Head Circumference --      Peak Flow --      Pain Score 06/07/20 1008 0     Pain Loc --      Pain Edu? --      Excl. in [place]? --    No data found.  Updated Vital Signs BP 124/88 (BP Location: Right Arm)   Pulse (!) 104   Temp 99.7 F (37.6 C)   Resp 18   Ht 5\' 3"  (1.6 m)   Wt 76.2 kg   LMP 08/29/2005 (Approximate)   SpO2 98%   BMI 29.76 kg/m   Visual Acuity Right Eye Distance:   Left Eye Distance:   Bilateral Distance:    Right Eye Near:   Left Eye Near:    Bilateral Near:     Physical Exam Nursing notes and Vital Signs reviewed. Appearance:  Patient appears stated age, and in no acute distress Eyes:  Pupils are equal, round, and reactive to light and accomodation.  Extraocular movement is intact.  Conjunctivae are not inflamed  Ears:  Canals normal.  Tympanic membranes normal.  Nose:  Mildly congested turbinates.  No sinus tenderness.  Pharynx:  Normal Neck:  Supple.  No adenopathy.  Lungs:  Faint scattered left side rhonchi and wheezing.  Breath sounds are equal.  Moving air well. Heart:  Regular rate and rhythm without murmurs, rubs, or gallops.  Abdomen:  Nontender without masses or hepatosplenomegaly.  Bowel sounds are present.  No CVA or flank tenderness.  Extremities:  No edema.  Skin:  No rash present.   UC Treatments / Results  Labs (all labs ordered are listed, but only abnormal results are displayed) Labs Reviewed  POCT CBC W AUTO DIFF ([facility]):  WBC 7.1;  LY 18.2; MO 9.3; GR  72.5; Hgb 13.3; Platelets 375     EKG   Radiology DG Chest 2 View  Result Date: 06/07/2020 CLINICAL DATA:  Cough for 2 weeks. EXAM: CHEST - 2 VIEW COMPARISON:  None. FINDINGS: The heart size and mediastinal contours are within normal limits. Both lungs are clear. The visualized skeletal structures are unremarkable. IMPRESSION: No active cardiopulmonary disease. Electronically Signed   By: Lovey Newcomer M.D.   On: 06/07/2020 11:25    Procedures Procedures (including critical care time)  Medications Ordered in UC Medications - No data to display  Initial Impression / Assessment and Plan / UC Course  I have reviewed the triage vital signs and the nursing notes.  Pertinent labs & imaging results that were available during my care of the patient were reviewed by me and considered in my medical decision making (see chart for details).    Negative chest x-ray and normal WBC reassuring. Cover for atypical organisms with doxycycline.  Begin prednisone burst/taper. COVID19 PCR pending. Followup with Family Doctor if not improved in about 10 days.   Final Clinical Impressions(s) / UC Diagnoses   Final diagnoses:  Acute bronchitis, unspecified organism  Cough, persistent     Discharge Instructions     Take plain guaifenesin (1200mg  extended release tabs such as Mucinex) twice daily, with plenty of water, for cough and congestion.  If needed, may add Pseudoephedrine (30mg , one or two every 4 to 6 hours) for sinus congestion.  Get adequate rest.   Stop amoxicillin, Tessalon, Daquil, and Claritin. Use albuterol inhaler as needed. Stop all antihistamines for now, and other non-prescription cough/cold preparations. Recommend a Tdap when well.  Follow-up with family doctor if not improving about10 days.    ED Prescriptions    Medication Sig Dispense Auth. Provider   doxycycline (VIBRAMYCIN) 100 MG capsule Take one tab PO Q12hr with food. 20 capsule Kandra Nicolas, MD   predniSONE  (DELTASONE) 20 MG tablet Take one tab by mouth twice daily for 4 days, then one daily for 3 days. Take with food. 11 tablet Kandra Nicolas, MD   guaiFENesin-codeine 100-10 MG/5ML syrup Take 70mL by mouth at bedtime as needed for cough. 50 mL Kandra Nicolas, MD        Kandra Nicolas, MD 06/08/20 1251

## 2020-06-09 LAB — SARS-COV-2 RNA,(COVID-19) QUALITATIVE NAAT: SARS CoV2 RNA: NOT DETECTED

## 2020-06-11 ENCOUNTER — Emergency Department
Admission: RE | Admit: 2020-06-11 | Discharge: 2020-06-11 | Disposition: A | Discharge status: Home / Self Care | Payer: BLUE CROSS/BLUE SHIELD | Source: Ambulatory Visit

## 2020-06-11 ENCOUNTER — Other Ambulatory Visit: Payer: Self-pay

## 2020-06-11 VITALS — BP 135/93 | HR 81 | Temp 98.7°F | Resp 18

## 2020-06-11 DIAGNOSIS — J209 Acute bronchitis, unspecified: Secondary | ICD-10-CM

## 2020-06-11 DIAGNOSIS — T7840XA Allergy, unspecified, initial encounter: Secondary | ICD-10-CM

## 2020-06-11 DIAGNOSIS — J040 Acute laryngitis: Secondary | ICD-10-CM

## 2020-06-11 DIAGNOSIS — J9801 Acute bronchospasm: Secondary | ICD-10-CM

## 2020-06-11 MED ORDER — AZITHROMYCIN 250 MG PO TABS: 250.0000 mg | ORAL_TABLET | Freq: Every day | ORAL | 0 refills | Status: DC

## 2020-06-11 NOTE — ED Provider Notes (Signed)
[facility]    CSN: [id number] Arrival date & time: 06/11/20  6195      History   Chief Complaint Chief Complaint  Patient presents with  . Allergic Reaction    Rash    HPI Alexandra Welch is a 44 y.o. female.   HPI Alexandra Welch is a 44 y.o. female presenting to UC with c/o rash to her face and mild lip swelling that started last night.  Pt has been taking doxycycline since 06/07/20 after be dx with bronchitis.  She was prescribed amoxicillin during an Evisit but was not improving, which prompted her to be changed to doxycycline for atypical coverage.  She notes she did sit on her porch yesterday but states it was in the shade. She denies any direct sun exposure.  She did start taking the prescribed prednisone yesterday, and questions if that could be the cause of her rash. She has never had prednisone before. Denies other rashes besides on her face, which has faded significantly since yesterday.  No trouble breathing besides the current cough she is being treated for. No throat or tongue swelling.  Hx of asthma, has been using her inhaler, last use was yesterday. No medication taken today.    Past Medical History:  Diagnosis Date  . Anxiety   . Asthma   . Basal cell carcinoma of skin    Dr. Derrel Nip  . Fibrocystic breast changes 08/27/2014   No hx breast biopsy. No FH first degree relative with breast or ovarian ca. Getting yearly mammograms per prior PCP recs.   Marland Kitchen PVC (premature ventricular contraction)    negative cardiac work up    Patient Active Problem List   Diagnosis Date Noted  . Mild intermittent asthma 08/27/2014  . Fibrocystic breast changes 08/27/2014  . Anxiety state 11/29/2007    Past Surgical History:  Procedure Laterality Date  . LAPAROSCOPIC HYSTERECTOMY  2007   for cervical dysplasia    OB History    Gravida  5   Para  2   Term      Preterm      AB      Living  2     SAB      TAB      Ectopic      Multiple       Live Births               Home Medications    Prior to Admission medications   Medication Sig Start Date End Date Taking? Authorizing Provider  albuterol (VENTOLIN HFA) 108 (90 Base) MCG/ACT inhaler Inhale 2 puffs into the lungs 2 (two) times daily as needed. 06/05/20   Billie Ruddy, MD  ALPRAZolam Duanne Moron) 0.5 MG tablet TAKE 1 TABLET BY MOUTH THREE TIMES DAILY AS NEEDED FOR ANXIETY 05/11/20   Cottle, Billey Co., MD  azithromycin (ZITHROMAX) 250 MG tablet Take 1 tablet (250 mg total) by mouth daily. Take first 2 tablets together, then 1 every day until finished. 06/11/20   Noe Gens, PA-C  Cholecalciferol (VITAMIN D PO) Take by mouth daily.    [provider]  fluticasone (FLONASE) 50 MCG/ACT nasal spray Place 1 spray into both nostrils daily. 06/05/20   Billie Ruddy, MD  guaiFENesin-codeine 100-10 MG/5ML syrup Take 38mL by mouth at bedtime as needed for cough. 06/07/20   Kandra Nicolas, MD  loratadine (CLARITIN) 10 MG tablet Take 10 mg by mouth daily.    [provider]  predniSONE (DELTASONE) 20 MG tablet Take one tab by mouth twice daily for 4 days, then one daily for 3 days. Take with food. 06/07/20   Kandra Nicolas, MD  sertraline (ZOLOFT) 100 MG tablet Take 1.5 tablets (150 mg total) by mouth daily. 01/06/20   Cottle, Billey Co., MD    Family History Family History  Problem Relation Age of Onset  . Lung cancer Father        smoker and worked in [employer]  . Asthma Mother   . Anxiety disorder Mother   . Post-traumatic stress disorder Mother   . Anxiety disorder Sister   . Obsessive Compulsive Disorder Son   . Anxiety disorder Sister   . Suicidality Brother        gambling died by suicide    Social History Social History   Tobacco Use  . Smoking status: Never Smoker  . Smokeless tobacco: Never Used  Vaping Use  . Vaping Use: Never used  Substance Use Topics  . Alcohol use: Not Currently  . Drug use: No     Allergies    Shellfish allergy and Doxycycline   Review of Systems Review of Systems  Constitutional: Negative for chills and fever.  HENT: Positive for congestion, facial swelling (lip), sore throat and voice change (hoarse). Negative for ear pain and trouble swallowing.   Respiratory: Positive for cough and wheezing. Negative for shortness of breath.   Cardiovascular: Negative for chest pain and palpitations.  Gastrointestinal: Negative for abdominal pain, diarrhea, nausea and vomiting.  Musculoskeletal: Negative for arthralgias, back pain and myalgias.  Skin: Positive for rash.  All other systems reviewed and are negative.    Physical Exam Triage Vital Signs ED Triage Vitals  Enc Vitals Group     BP 06/11/20 1009 (!) 135/93     Pulse Rate 06/11/20 1009 81     Resp 06/11/20 1009 18     Temp 06/11/20 1009 98.7 F (37.1 C)     Temp Source 06/11/20 1009 Oral     SpO2 06/11/20 1009 99 %     Weight --      Height --      Head Circumference --      Peak Flow --      Pain Score 06/11/20 1008 4     Pain Loc --      Pain Edu? --      Excl. in [place]? --    No data found.  Updated Vital Signs BP (!) 135/93 (BP Location: Right Arm)   Pulse 81   Temp 98.7 F (37.1 C) (Oral)   Resp 18   LMP 08/29/2005 (Approximate)   SpO2 99%   Visual Acuity Right Eye Distance:   Left Eye Distance:   Bilateral Distance:    Right Eye Near:   Left Eye Near:    Bilateral Near:     Physical Exam Vitals and nursing note reviewed.  Constitutional:      General: She is not in acute distress.    Appearance: Normal appearance. She is well-developed. She is not ill-appearing, toxic-appearing or diaphoretic.  HENT:     Head: Normocephalic and atraumatic.      Comments: Faint erythema over both cheeks No oral swelling.    Right Ear: Tympanic membrane and ear canal normal.     Left Ear: Tympanic membrane and ear canal normal.     Nose: Nose normal.     Right Sinus: No maxillary sinus tenderness  or  frontal sinus tenderness.     Left Sinus: No maxillary sinus tenderness or frontal sinus tenderness.     Mouth/Throat:     Lips: Pink.     Mouth: Mucous membranes are moist.     Pharynx: Oropharynx is clear. Uvula midline. No pharyngeal swelling, oropharyngeal exudate, posterior oropharyngeal erythema or uvula swelling.  Cardiovascular:     Rate and Rhythm: Normal rate and regular rhythm.  Pulmonary:     Effort: Pulmonary effort is normal. No respiratory distress.     Breath sounds: No stridor. Wheezing and rhonchi present. No rales.     Comments: Minimally productive hacking cough on exam. Able to speak in full sentences between coughing. Hoarse voice without stridor Musculoskeletal:        General: Normal range of motion.     Cervical back: Normal range of motion and neck supple.  Lymphadenopathy:     Cervical: No cervical adenopathy.  Skin:    General: Skin is warm and dry.  Neurological:     Mental Status: She is alert and oriented to person, place, and time.  Psychiatric:        Behavior: Behavior normal.      UC Treatments / Results  Labs (all labs ordered are listed, but only abnormal results are displayed) Labs Reviewed - No data to display  EKG   Radiology No results found.  Procedures Procedures (including critical care time)  Medications Ordered in UC Medications - No data to display  Initial Impression / Assessment and Plan / UC Course  I have reviewed the triage vital signs and the nursing notes.  Pertinent labs & imaging results that were available during my care of the patient were reviewed by me and considered in my medical decision making (see chart for details).     Reviewed medical records from visit on 05/1020 CXR was normal at that time, pt being treated for suspected atypical bacteria Will change pt to azithromycin, she has done well with this in the past. Advised pt she can take another dose of prednisone today, however, monitor for  reaction and discontinue if she develops a rash again. F/u with PCP next week Discussed symptoms that warrant emergent care in the ED. AVS given  Final Clinical Impressions(s) / UC Diagnoses   Final diagnoses:  Acute bronchitis, unspecified organism  Acute bronchospasm  Laryngitis  Allergic reaction, initial encounter     Discharge Instructions      Please take antibiotics as prescribed and be sure to complete entire course even if you start to feel better to ensure infection does not come back. You can start back your prednisone today but if you notice any reactions again, stop taking it and let us know or your primary care provider know so that it can be added to your medical chart.  The doxycycline will still be in your system for a few days so try to avoid being out in the sun to help limit risk of the rash returning.  .You may take 500mg  acetaminophen every 4-6 hours or in combination with ibuprofen 400-600mg  every 6-8 hours as needed for pain, inflammation, and fever.  Be sure to well hydrated with clear liquids and get at least 8 hours of sleep at night, preferably more while sick.   Please follow up with family medicine in 1 week if needed.      ED Prescriptions    Medication Sig Dispense Auth. Provider   azithromycin (ZITHROMAX) 250 MG tablet Take  1 tablet (250 mg total) by mouth daily. Take first 2 tablets together, then 1 every day until finished. 6 tablet Noe Gens, PA-C     PDMP not reviewed this encounter.   Noe Gens, [place] 06/11/20 1238

## 2020-06-11 NOTE — Discharge Instructions (Signed)
  Please take antibiotics as prescribed and be sure to complete entire course even if you start to feel better to ensure infection does not come back. You can start back your prednisone today but if you notice any reactions again, stop taking it and let us know or your primary care provider know so that it can be added to your medical chart.  The doxycycline will still be in your system for a few days so try to avoid being out in the sun to help limit risk of the rash returning.  .You may take 500mg  acetaminophen every 4-6 hours or in combination with ibuprofen 400-600mg  every 6-8 hours as needed for pain, inflammation, and fever.  Be sure to well hydrated with clear liquids and get at least 8 hours of sleep at night, preferably more while sick.   Please follow up with family medicine in 1 week if needed.

## 2020-06-11 NOTE — ED Triage Notes (Signed)
Patient presents to Urgent Care with complaints of rash and sunburn reaction on her face since taking doxycycline for bronchitis. Patient reports she also feels like her lips are slightly swollen, has no difficulty swallowing or breathing.

## 2020-06-30 DIAGNOSIS — L720 Epidermal cyst: Secondary | ICD-10-CM | POA: Diagnosis not present

## 2020-06-30 DIAGNOSIS — D225 Melanocytic nevi of trunk: Secondary | ICD-10-CM | POA: Diagnosis not present

## 2020-06-30 DIAGNOSIS — D485 Neoplasm of uncertain behavior of skin: Secondary | ICD-10-CM | POA: Diagnosis not present

## 2020-06-30 DIAGNOSIS — Z85828 Personal history of other malignant neoplasm of skin: Secondary | ICD-10-CM | POA: Diagnosis not present

## 2020-06-30 DIAGNOSIS — L570 Actinic keratosis: Secondary | ICD-10-CM | POA: Diagnosis not present

## 2020-06-30 DIAGNOSIS — L910 Hypertrophic scar: Secondary | ICD-10-CM | POA: Diagnosis not present

## 2020-06-30 DIAGNOSIS — D2262 Melanocytic nevi of left upper limb, including shoulder: Secondary | ICD-10-CM | POA: Diagnosis not present

## 2020-07-07 ENCOUNTER — Ambulatory Visit (INDEPENDENT_AMBULATORY_CARE_PROVIDER_SITE_OTHER): Payer: BC Managed Care – PPO | Admitting: Psychiatry

## 2020-07-07 ENCOUNTER — Encounter: Payer: Self-pay | Admitting: Psychiatry

## 2020-07-07 ENCOUNTER — Other Ambulatory Visit: Payer: Self-pay

## 2020-07-07 DIAGNOSIS — F5105 Insomnia due to other mental disorder: Secondary | ICD-10-CM | POA: Diagnosis not present

## 2020-07-07 DIAGNOSIS — F4001 Agoraphobia with panic disorder: Secondary | ICD-10-CM

## 2020-07-07 DIAGNOSIS — F411 Generalized anxiety disorder: Secondary | ICD-10-CM

## 2020-07-07 MED ORDER — SERTRALINE HCL 100 MG PO TABS: 150.0000 mg | ORAL_TABLET | Freq: Every day | ORAL | 2 refills | Status: DC

## 2020-07-07 MED ORDER — ALPRAZOLAM 0.5 MG PO TABS: 0.5000 mg | ORAL_TABLET | Freq: Three times a day (TID) | ORAL | 2 refills | Status: DC | PRN

## 2020-07-07 NOTE — Progress Notes (Signed)
Alexandra Welch [id number] Jan 03, 1976 44 y.o.  Subjective:   Patient ID:  Alexandra Welch is a 44 y.o. (DOB [birth year]) female.  Chief Complaint:  Chief Complaint  Patient presents with  . Follow-up  . Anxiety    HPI Alexandra Welch presents to the office today for follow-up of panic disorder, generalized anxiety disorder, and insomnia.  She was first seen on October 15, 2018.  visit January 28, 2019.  She was having continued panic attacks but they overall were much improved on sertraline 100 mg versus Lexapro.  However she was still having some nocturnal panic attacks as well so we decided to increase sertraline to 150 mg daily.  visit May 14, 2019.  She was still having 1-2 panic attacks on sertraline 150 mg daily but that was improved.  She was encouraged to increase it to 200 mg daily to try to completely suppress the panic but she chose not to do so.  seen Sep 10, 2019 with the following noted and no meds changed. Panic a little better about once per month.  Nocturnal only now.  NM are not the cause.    Better control of them but last 10-15 mins.  Not depressed generally.  Circumstantial anxiety noted.  Sleep 6 hours.  Awakes 2-3 AM and can be awake 1 1/2 hours.  Gets up 6 am.  Some tiredness in the afternoon.    H still on chemo and not doing well. and doesn't sleep well and that's stressful.  He's still not doing well and this causes anxiety.  He doesn't sleep well.  A few panic but less and can still be out of the blue and are usually those are nocturnal.  No consistent NM.  She is only having nocturnal panic attacks at this time.  Overrate all anxiety is okay with the exception of worry about her husband.  She does not sleep enough and has early morning awakening.  It is not always associated with anxiety.  Denies appetite disturbance.  Patient reports that energy and motivation have been good.  Patient denies any difficulty with concentration.  Patient denies any suicidal  ideation.  01/06/20 appt with the following noted: Still on sertaline 150 and Xanax 0.5 mg HS only usually.  Sleep is a little better than last time.  Overall good with less panic.  2-3 in 4 mos.  Situational anxiety.   H cancer is primary source of anxiety.  Also kids moving an having babies create anxiety but expected.  No avoidance bc most panic was nocturnal. No NM.  No depression normally. Plan: She decided to continue the current dose of sertraline 150 mg daily  Option increase sertraline  07/07/2020 appointment with the following noted: Rare daytime Xanax. Needs it for sleep. Pretty good ovrall except situational anxiety.  No panic in mos. A lot of HA but may be anxiety related.  Can have for a couple days and resolve. H cancer is primary source of anxiety and not doing well, back on IV chemo. He's been really sick 2with this one and not working. Still some awakening but not with panic.  Not depressed. No SE.  No caffeine.  No history Alc drug problems.   Past Psychiatric Medication Trials:  Ativan tiredness, Lexapro, paxil years agoSE Trazodone hangover M takes Klonopin HS Sertraline 150  Review of Systems:  Review of Systems  Cardiovascular: Positive for palpitations.       Normal cardiac workup.  Palpitations with anxiety  Neurological: Negative  for dizziness, tremors and weakness.    Medications: I have reviewed the patient's current medications.  Current Outpatient Medications  Medication Sig Dispense Refill  . albuterol (VENTOLIN HFA) 108 (90 Base) MCG/ACT inhaler Inhale 2 puffs into the lungs 2 (two) times daily as needed. 3.7 g 1  . ALPRAZolam (XANAX) 0.5 MG tablet Take 1 tablet (0.5 mg total) by mouth 3 (three) times daily as needed. for anxiety 90 tablet 2  . azithromycin (ZITHROMAX) 250 MG tablet Take 1 tablet (250 mg total) by mouth daily. Take first 2 tablets together, then 1 every day until finished. 6 tablet 0  . Cholecalciferol (VITAMIN D PO) Take by mouth  daily.    . fluticasone (FLONASE) 50 MCG/ACT nasal spray Place 1 spray into both nostrils daily. 16 g 0  . guaiFENesin-codeine 100-10 MG/5ML syrup Take 69mL by mouth at bedtime as needed for cough. 50 mL 0  . loratadine (CLARITIN) 10 MG tablet Take 10 mg by mouth daily.    . predniSONE (DELTASONE) 20 MG tablet Take one tab by mouth twice daily for 4 days, then one daily for 3 days. Take with food. 11 tablet 0  . sertraline (ZOLOFT) 100 MG tablet Take 1.5 tablets (150 mg total) by mouth daily. 135 tablet 2   No current facility-administered medications for this visit.    Medication Side Effects: None  Allergies:  Allergies  Allergen Reactions  . Doxycycline Swelling and Rash    Mild upper lip swelling, facial rash.   Marland Kitchen Shellfish Allergy Swelling    Past Medical History:  Diagnosis Date  . Anxiety   . Asthma   . Basal cell carcinoma of skin    Dr. Derrel Nip  . Fibrocystic breast changes 08/27/2014   No hx breast biopsy. No FH first degree relative with breast or ovarian ca. Getting yearly mammograms per prior PCP recs.   Marland Kitchen PVC (premature ventricular contraction)    negative cardiac work up    Family History  Problem Relation Age of Onset  . Lung cancer Father        smoker and worked in [employer]  . Asthma Mother   . Anxiety disorder Mother   . Post-traumatic stress disorder Mother   . Anxiety disorder Sister   . Obsessive Compulsive Disorder Son   . Anxiety disorder Sister   . Suicidality Brother        gambling died by suicide    Social History   Socioeconomic History  . Marital status: Married    Spouse name: Pilar Plate  . Number of children: 2  . Years of education: Not on file  . Highest education level: Not on file  Occupational History  . Not on file  Tobacco Use  . Smoking status: Never Smoker  . Smokeless tobacco: Never Used  Vaping Use  . Vaping Use: Never used  Substance and Sexual Activity  . Alcohol use: Not Currently  . Drug use: No  . Sexual  activity: Yes    Partners: Male    Birth control/protection: Surgical    Comment: hysterectomy  Other Topics Concern  . Not on file  Social History Narrative   Work or School: Scientist, clinical (histocompatibility and immunogenetics) [employer]      Home Situation: lives with husband and two older children      Spiritual Beliefs: Baptist      Lifestyle: exercise is sporadic; diet is ok      Scientist, physiological Strain:   .  Difficulty of Paying Living Expenses: Not on file  Food Insecurity:   . Worried About Charity fundraiser in the Last Year: Not on file  . Ran Out of Food in the Last Year: Not on file  Transportation Needs:   . Lack of Transportation (Medical): Not on file  . Lack of Transportation (Non-Medical): Not on file  Physical Activity:   . Days of Exercise per Week: Not on file  . Minutes of Exercise per Session: Not on file  Stress:   . Feeling of Stress : Not on file  Social Connections:   . Frequency of Communication with Friends and Family: Not on file  . Frequency of Social Gatherings with Friends and Family: Not on file  . Attends Religious Services: Not on file  . Active Member of Clubs or Organizations: Not on file  . Attends Archivist Meetings: Not on file  . Marital Status: Not on file  Intimate Partner Violence:   . Fear of Current or Ex-Partner: Not on file  . Emotionally Abused: Not on file  . Physically Abused: Not on file  . Sexually Abused: Not on file    Past Medical History, Surgical history, Social history, and Family history were reviewed and updated as appropriate.   Please see review of systems for further details on the patient's review from today.   Objective:   Physical Exam:  LMP 08/29/2005 (Approximate)   Physical Exam Constitutional:      General: She is not in acute distress.    Appearance: She is well-developed.  Musculoskeletal:        General: No deformity.  Neurological:     Mental Status: She is alert and  oriented to person, place, and time.     Coordination: Coordination normal.  Psychiatric:        Attention and Perception: Attention normal. She does not perceive auditory hallucinations.        Mood and Affect: Mood is anxious. Mood is not depressed. Affect is not labile, blunt, angry or inappropriate.        Speech: Speech normal.        Behavior: Behavior normal.        Thought Content: Thought content normal. Thought content does not include homicidal or suicidal ideation. Thought content does not include homicidal or suicidal plan.        Cognition and Memory: Cognition normal.        Judgment: Judgment normal.     Comments: Insight intact. No auditory or visual hallucinations. No delusions.  Affect is a little anxious as well as mood overall manageable.     Lab Review:     Component Value Date/Time   NA 139 10/21/2019 0843   K 4.6 10/21/2019 0843   CL 104 10/21/2019 0843   CO2 21 10/21/2019 0843   GLUCOSE 83 10/21/2019 0843   GLUCOSE 78 11/29/2016 1413   BUN 15 10/21/2019 0843   CREATININE 0.83 10/21/2019 0843   CREATININE 0.80 11/29/2016 1413   CALCIUM 9.4 10/21/2019 0843   PROT 6.9 10/21/2019 0843   ALBUMIN 4.3 10/21/2019 0843   AST 17 10/21/2019 0843   ALT 18 10/21/2019 0843   ALKPHOS 43 10/21/2019 0843   BILITOT 0.4 10/21/2019 0843   GFRNONAA 85 10/21/2019 0843   GFRAA 98 10/21/2019 0843       Component Value Date/Time   WBC 6.9 10/21/2019 0843   WBC 6.2 08/12/2013 0857   RBC 4.99 10/21/2019 0843  RBC 4.53 08/12/2013 0857   HGB 14.5 10/21/2019 0843   HCT 42.5 10/21/2019 0843   PLT 332 10/21/2019 0843   MCV 85 10/21/2019 0843   MCH 29.1 10/21/2019 0843   MCHC 34.1 10/21/2019 0843   MCHC 34.1 08/12/2013 0857   RDW 12.6 10/21/2019 0843   LYMPHSABS 1.9 08/12/2013 0857   MONOABS 0.5 08/12/2013 0857   EOSABS 0.3 08/12/2013 0857   BASOSABS 0.0 08/12/2013 0857    No results found for: POCLITH, LITHIUM   No results found for: PHENYTOIN, PHENOBARB,  VALPROATE, CBMZ   .res Assessment: Plan:    Panic disorder with agoraphobia - Plan: sertraline (ZOLOFT) 100 MG tablet, ALPRAZolam (XANAX) 0.5 MG tablet  Generalized anxiety disorder - Plan: sertraline (ZOLOFT) 100 MG tablet  Insomnia due to mental condition   Greater than 50% of 30 min face to face time with patient was spent on counseling and coordination of care.  She believes the major source of her anxiety is her husband's serious cancer. History of nocturnal panic attacks that wake her out of sleep for no apparent reason.    She's satisfied.  She decided to continue the current dose of sertraline 150 mg daily  Panic stopped and anxiety is manageable.  We discussed the pros and cons of the Xanax usage and she is not using it excessively.  Is hoped that she will be able to gradually discontinue that except situationally.  She does have some significant situational anxiety over the health of her husband.  Supportive therapy around that issue.   Patient's insomnia failed to respond to trazodone because of hangover. Sleep better with Xanax She has continued early morning awakening and decreased quantity of sleep.  This is probably contributing to her not feeling as well during the day.  Discussed at length the pros and cons of using the Xanax on a consistent basis at night.  She has a family history of chronic insomnia.  We discussed her absence of history of drug and alcohol abuse.  We discussed the tolerance issues around benzodiazepines when used chronically for sleep but they can be used successfully at times as is the case with her mother.  We discussed withdrawal symptoms.  It is unlikely that sleep agents that do not address anxiety will help her sleep.    Follow-up 9 months  Hiram Comber, MD, DFAPA  Please see After Visit Summary for patient specific instructions.  Future Appointments  Date Time Provider [place]  01/01/2021  1:30 PM Christie PROVIDER Zeb None    No orders  of the defined types were placed in this encounter.     -------------------------------

## 2020-10-07 DIAGNOSIS — L570 Actinic keratosis: Secondary | ICD-10-CM | POA: Diagnosis not present

## 2020-10-20 ENCOUNTER — Other Ambulatory Visit: Payer: Self-pay | Admitting: Psychiatry

## 2020-10-20 ENCOUNTER — Telehealth: Payer: Self-pay | Admitting: Psychiatry

## 2020-10-20 DIAGNOSIS — F411 Generalized anxiety disorder: Secondary | ICD-10-CM

## 2020-10-20 DIAGNOSIS — F4001 Agoraphobia with panic disorder: Secondary | ICD-10-CM

## 2020-10-20 MED ORDER — SERTRALINE HCL 100 MG PO TABS: 150.0000 mg | ORAL_TABLET | Freq: Every day | ORAL | 2 refills | Status: DC

## 2020-10-20 NOTE — Telephone Encounter (Signed)
Pt has new pharmacy due to insurance. [facility] new pharmacy. Need New Rx for Sertraline and [facility]am to [facility]. Do not use [facility] pharmacy from now on. Apt 04/06/21 9 mos follow up

## 2020-10-23 ENCOUNTER — Other Ambulatory Visit: Payer: Self-pay | Admitting: Psychiatry

## 2020-10-23 DIAGNOSIS — F4001 Agoraphobia with panic disorder: Secondary | ICD-10-CM

## 2020-12-22 ENCOUNTER — Other Ambulatory Visit: Payer: Self-pay | Admitting: Obstetrics & Gynecology

## 2020-12-22 ENCOUNTER — Encounter (HOSPITAL_BASED_OUTPATIENT_CLINIC_OR_DEPARTMENT_OTHER): Payer: Self-pay | Admitting: Obstetrics & Gynecology

## 2020-12-22 ENCOUNTER — Other Ambulatory Visit: Payer: Self-pay

## 2020-12-22 ENCOUNTER — Ambulatory Visit (INDEPENDENT_AMBULATORY_CARE_PROVIDER_SITE_OTHER): Payer: 59 | Admitting: Obstetrics & Gynecology

## 2020-12-22 VITALS — BP 148/88 | HR 91 | Ht 62.0 in | Wt 174.2 lb

## 2020-12-22 DIAGNOSIS — N871 Moderate cervical dysplasia: Secondary | ICD-10-CM | POA: Diagnosis not present

## 2020-12-22 DIAGNOSIS — Z1211 Encounter for screening for malignant neoplasm of colon: Secondary | ICD-10-CM

## 2020-12-22 DIAGNOSIS — Z1231 Encounter for screening mammogram for malignant neoplasm of breast: Secondary | ICD-10-CM | POA: Diagnosis not present

## 2020-12-22 DIAGNOSIS — Z01419 Encounter for gynecological examination (general) (routine) without abnormal findings: Secondary | ICD-10-CM

## 2020-12-22 NOTE — Progress Notes (Signed)
45 y.o. [id number] Married White or Caucasian female here for annual exam.  Husband is off chemo and came off in January.  He broke his fibula with a fall.    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  She does think she will have to go back into a more regular job.    Patient's last menstrual period was 08/29/2005 (approximate).          Sexually active: No.  The current method of family planning is status post hysterectomy.    Exercising: Yes.    walks/runs 2 miles daily Smoker:  no  Health Maintenance: Pap:  Neg with neg HR HPV History of abnormal Pap:  2007, high grade dysplasia MMG:  Scheduled for 01/2021 Colonoscopy:  Referral placed today TDaP:  04/15/2011 Hep C testing: 2011 Screening Labs: Dr. Inocente Salles   reports that she has never smoked. She has never used smokeless tobacco. She reports previous alcohol use. She reports that she does not use drugs.  Past Medical History:  Diagnosis Date  . Anxiety   . Asthma   . Basal cell carcinoma of skin    Dr. Derrel Nip  . Fibrocystic breast changes 08/27/2014   No hx breast biopsy. No FH first degree relative with breast or ovarian ca. Getting yearly mammograms per prior PCP recs.   Marland Kitchen PVC (premature ventricular contraction)    negative cardiac work up    Past Surgical History:  Procedure Laterality Date  . LAPAROSCOPIC HYSTERECTOMY  2007   for cervical dysplasia    Current Outpatient Medications  Medication Sig Dispense Refill  . albuterol (VENTOLIN HFA) 108 (90 Base) MCG/ACT inhaler Inhale 2 puffs into the lungs 2 (two) times daily as needed. 3.7 g 1  . ALPRAZolam (XANAX) 0.5 MG tablet TAKE 1 TABLET (0.5 MG TOTAL) BY MOUTH 3 (THREE) TIMES DAILY AS NEEDED FOR ANXIETY. 60 tablet 5  . Cholecalciferol (VITAMIN D PO) Take by mouth daily.    Marland Kitchen loratadine (CLARITIN) 10 MG tablet Take 10 mg by mouth daily.    . sertraline (ZOLOFT) 100 MG tablet Take 1.5 tablets (150 mg total) by mouth daily. 135  tablet 2  . fluticasone (FLONASE) 50 MCG/ACT nasal spray Place 1 spray into both nostrils daily. (Patient not taking: Reported on 12/22/2020) 16 g 0   No current facility-administered medications for this visit.    Family History  Problem Relation Age of Onset  . Lung cancer Father        smoker and worked in [employer]  . Asthma Mother   . Anxiety disorder Mother   . Post-traumatic stress disorder Mother   . Anxiety disorder Sister   . Obsessive Compulsive Disorder Son   . Anxiety disorder Sister   . Suicidality Brother        gambling died by suicide    Review of Systems  All other systems reviewed and are negative.   Exam:   Pulse 91   Ht 5\' 2"  (1.575 m)   Wt 174 lb 3.2 oz (79 kg)   LMP 08/29/2005 (Approximate)   BMI 31.86 kg/m   Height: 5\' 2"  (157.5 cm)  General appearance: alert, cooperative and appears stated age Head: Normocephalic, without obvious abnormality, atraumatic Neck: no adenopathy, supple, symmetrical, trachea midline and thyroid normal to inspection and palpation Lungs: clear to auscultation bilaterally Breasts: normal appearance, no masses or tenderness Heart: regular rate and rhythm Abdomen: soft, non-tender; bowel sounds normal; no masses,  no organomegaly Extremities: extremities normal, atraumatic, no cyanosis or edema Skin: Skin color, texture, turgor normal. No rashes or lesions Lymph nodes: Cervical, supraclavicular, and axillary nodes normal. No abnormal inguinal nodes palpated Neurologic: Grossly normal   Pelvic: External genitalia:  no lesions              Urethra:  normal appearing urethra with no masses, tenderness or lesions              Bartholins and Skenes: normal                 Vagina: normal appearing vagina with normal color and no discharge, no lesions              Cervix: absent              Pap taken: No. Bimanual Exam:  Uterus:  uterus absent              Adnexa: no mass, fullness, tenderness               Rectovaginal:  Confirms               Anus:  normal sphincter tone, no lesions  Chaperone, Zoila Shutter, RN, was present for exam.  Assessment/Plan: 1. Well woman exam with routine gynecological exam - pap and HR HPV 2021.  Not indicated today. - pt has MMG scheduled but new order to [facility] placed - colonoscopy referral placed - will do lab work with PCP this year  2. Encounter for screening mammogram for malignant neoplasm of breast - MM 3D SCREEN BREAST BILATERAL; Future  3. Colon cancer screening - Ambulatory referral to Gastroenterology  4. Dysplasia of cervix, high grade CIN 2 - pt and I discussed guidelines  5.  Anxiety  6.  Elevated BP - pt will monitor at home.  Does have some anxiety with doctor visits.

## 2020-12-30 ENCOUNTER — Ambulatory Visit (INDEPENDENT_AMBULATORY_CARE_PROVIDER_SITE_OTHER): Payer: 59

## 2020-12-30 ENCOUNTER — Other Ambulatory Visit: Payer: Self-pay

## 2020-12-30 DIAGNOSIS — Z1231 Encounter for screening mammogram for malignant neoplasm of breast: Secondary | ICD-10-CM

## 2021-01-01 ENCOUNTER — Ambulatory Visit: Payer: Self-pay

## 2021-02-06 ENCOUNTER — Ambulatory Visit: Payer: 59

## 2021-02-10 DIAGNOSIS — Z1231 Encounter for screening mammogram for malignant neoplasm of breast: Secondary | ICD-10-CM

## 2021-03-16 ENCOUNTER — Other Ambulatory Visit: Payer: Self-pay

## 2021-03-16 ENCOUNTER — Emergency Department
Admission: RE | Admit: 2021-03-16 | Discharge: 2021-03-16 | Disposition: A | Discharge status: Home / Self Care | Payer: 59 | Source: Ambulatory Visit | Attending: Family Medicine | Admitting: Family Medicine

## 2021-03-16 VITALS — BP 121/88 | HR 90 | Temp 99.0°F | Resp 24 | Ht 63.0 in | Wt 155.0 lb

## 2021-03-16 DIAGNOSIS — U071 COVID-19: Secondary | ICD-10-CM | POA: Diagnosis not present

## 2021-03-16 DIAGNOSIS — R0602 Shortness of breath: Secondary | ICD-10-CM

## 2021-03-16 NOTE — ED Triage Notes (Signed)
Pt presents to Urgent Care with c/o sob and mid-sternal chest discomfort. She is COVID positive and has asthma; unvaccinated. Reports symptoms of body aches and vomiting began 3 days ago, but these symptoms have resolved.

## 2021-03-16 NOTE — ED Provider Notes (Signed)
[facility]    CSN: [id number] Arrival date & time: 03/16/21  1636      History   Chief Complaint Chief Complaint  Patient presents with   Covid Positive   Shortness of Breath    HPI Alexandra Welch is a 45 y.o. female.   HPI  Patient tested positive for COVID today.  For the last 3 days she has had some body aches fever chills and GI symptoms.  She tested for COVID and was negative.  Today she had shortness of breath and chest pain.  Took another test and the COVID was positive.  She is here to make sure that she does not have an asthma flare causing her shortness of breath, wants to know what to do to treat the chest pain.  Chest pain is with coughing with deep breath.  Its more of a pressure sensation.  Currently her only symptoms, the body aches headaches and fever have gone away  Past Medical History:  Diagnosis Date   Anxiety    Asthma    Basal cell carcinoma of skin    Dr. Derrel Nip   Fibrocystic breast changes 08/27/2014   No hx breast biopsy. No FH first degree relative with breast or ovarian ca. Getting yearly mammograms per prior PCP recs.    PVC (premature ventricular contraction)    negative cardiac work up    Patient Active Problem List   Diagnosis Date Noted   Mild intermittent asthma 08/27/2014   Fibrocystic breast changes 08/27/2014   Anxiety state 11/29/2007    Past Surgical History:  Procedure Laterality Date   LAPAROSCOPIC HYSTERECTOMY  2007   for cervical dysplasia    OB History     Gravida  5   Para  2   Term      Preterm      AB  3   Living  2      SAB  3   IAB      Ectopic      Multiple      Live Births               Home Medications    Prior to Admission medications   Medication Sig Start Date End Date Taking? Authorizing Provider  acetaminophen (TYLENOL) 325 MG tablet Take 650 mg by mouth every 6 (six) hours as needed.   Yes [provider]  albuterol (VENTOLIN HFA) 108 (90 Base)  MCG/ACT inhaler Inhale 2 puffs into the lungs 2 (two) times daily as needed. 06/05/20   Billie Ruddy, MD  ALPRAZolam Duanne Moron) 0.5 MG tablet TAKE 1 TABLET (0.5 MG TOTAL) BY MOUTH 3 (THREE) TIMES DAILY AS NEEDED FOR ANXIETY. 10/23/20   Cottle, Billey Co., MD  Cholecalciferol (VITAMIN D PO) Take by mouth daily.    [provider]  fluticasone (FLONASE) 50 MCG/ACT nasal spray Place 1 spray into both nostrils daily. Patient not taking: No sig reported 06/05/20   Billie Ruddy, MD  loratadine (CLARITIN) 10 MG tablet Take 10 mg by mouth daily.    [provider]  sertraline (ZOLOFT) 100 MG tablet Take 1.5 tablets (150 mg total) by mouth daily. 10/20/20   Cottle, Billey Co., MD    Family History Family History  Problem Relation Age of Onset   Asthma Mother    Anxiety disorder Mother    Post-traumatic stress disorder Mother    Lung cancer Father        smoker and  worked in [place] disorder Sister    Anxiety disorder Sister    Suicidality Brother        gambling died by suicide   Obsessive Compulsive Disorder Son     Social History Social History   Tobacco Use   Smoking status: Never   Smokeless tobacco: Never  Vaping Use   Vaping Use: Never used  Substance Use Topics   Alcohol use: Not Currently   Drug use: No     Allergies   Doxycycline and Shellfish allergy   Review of Systems Review of Systems  See HPI Physical Exam Triage Vital Signs ED Triage Vitals  Enc Vitals Group     BP 03/16/21 1656 121/88     Pulse Rate 03/16/21 1656 90     Resp 03/16/21 1656 (!) 24     Temp 03/16/21 1656 99 F (37.2 C)     Temp src --      SpO2 03/16/21 1656 98 %     Weight 03/16/21 1652 155 lb (70.3 kg)     Height 03/16/21 1652 5\' 3"  (1.6 m)     Head Circumference --      Peak Flow --      Pain Score 03/16/21 1652 4     Pain Loc --      Pain Edu? --      Excl. in [place]? --    No data found.  Updated Vital Signs BP 121/88   Pulse 90   Temp 99 F  (37.2 C)   Resp (!) 24   Ht 5\' 3"  (1.6 m)   Wt 70.3 kg   LMP 08/29/2005 (Approximate)   SpO2 98%   BMI 27.46 kg/m      Physical Exam Constitutional:      Comments: Additional PPE worn.  Patient's mask is in place.  No acute distress.  Normal conversation     UC Treatments / Results  Labs (all labs ordered are listed, but only abnormal results are displayed) Labs Reviewed - No data to display  EKG   Radiology No results found.  Procedures Procedures (including critical care time)  Medications Ordered in UC Medications - No data to display  Initial Impression / Assessment and Plan / UC Course  I have reviewed the triage vital signs and the nursing notes.  Pertinent labs & imaging results that were available during my care of the patient were reviewed by me and considered in my medical decision making (see chart for details).     Patient is known to [place].  Shortness of breath and chest pain.  The symptoms are common with COVID.  No evidence of heart disease.  The chest pain is chest wall pain.  The shortness of breath is present with normal lung sounds.  We will try albuterol.  Tylenol for pain.  Patient states all NSAID drugs because her tongue to itch.  Return as needed Final Clinical Impressions(s) / UC Diagnoses   Final diagnoses:  COVID  Shortness of breath     Discharge Instructions       Drink plenty of water Use albuterol as needed Tylenol for pain Stay home Call for video advice for other questions   ED Prescriptions   None    PDMP not reviewed this encounter.   Raylene Everts, MD 03/16/21 1946

## 2021-03-16 NOTE — Discharge Instructions (Addendum)
  Drink plenty of water Use albuterol as needed Tylenol for pain Stay home Call for video advice for other questions

## 2021-03-17 ENCOUNTER — Encounter: Payer: Self-pay | Admitting: Family Medicine

## 2021-03-19 ENCOUNTER — Telehealth (INDEPENDENT_AMBULATORY_CARE_PROVIDER_SITE_OTHER): Payer: 59 | Admitting: Family Medicine

## 2021-03-19 ENCOUNTER — Encounter: Payer: Self-pay | Admitting: Family Medicine

## 2021-03-19 DIAGNOSIS — U071 COVID-19: Secondary | ICD-10-CM

## 2021-03-19 DIAGNOSIS — R200 Anesthesia of skin: Secondary | ICD-10-CM

## 2021-03-19 DIAGNOSIS — R202 Paresthesia of skin: Secondary | ICD-10-CM

## 2021-03-19 NOTE — Progress Notes (Signed)
Virtual Visit via Video Note  I connected with Alexandra Welch on [id number] at  4:00 PM EDT by a video enabled telemedicine application 2/2 [id number] pandemic and verified that I am speaking with the correct person using two identifiers.  Location patient: home Location provider:work or home office Persons participating in the virtual visit: patient, provider  I discussed the limitations of evaluation and management by telemedicine and the availability of in person appointments. The patient expressed understanding and agreed to proceed.   HPI: Tested positive for COVID last Monday 7/18.  She started feeling sick on 7/16.  Initially tested negative.  Having a mild cough, stuffy nose, fever, body aches, vomiting.  Started getting tingling in fingers and toes for the last 3 days.  Pt has no appetite.  Lost sense of smell.  Drinking water and gatorade.  Progressed to patient's family has become sick as well.   ROS: See pertinent positives and negatives per HPI.  Past Medical History:  Diagnosis Date   Anxiety    Asthma    Basal cell carcinoma of skin    Dr. Derrel Nip   Fibrocystic breast changes 08/27/2014   No hx breast biopsy. No FH first degree relative with breast or ovarian ca. Getting yearly mammograms per prior PCP recs.    PVC (premature ventricular contraction)    negative cardiac work up    Past Surgical History:  Procedure Laterality Date   LAPAROSCOPIC HYSTERECTOMY  2007   for cervical dysplasia    Family History  Problem Relation Age of Onset   Asthma Mother    Anxiety disorder Mother    Post-traumatic stress disorder Mother    Lung cancer Father        smoker and worked in [place] disorder Sister    Anxiety disorder Sister    Suicidality Brother        gambling died by suicide   Obsessive Compulsive Disorder Son      Current Outpatient Medications:    acetaminophen (TYLENOL) 325 MG tablet, Take 650 mg by mouth every 6 (six) hours as needed., Disp:  , Rfl:    albuterol (VENTOLIN HFA) 108 (90 Base) MCG/ACT inhaler, Inhale 2 puffs into the lungs 2 (two) times daily as needed., Disp: 3.7 g, Rfl: 1   ALPRAZolam (XANAX) 0.5 MG tablet, TAKE 1 TABLET (0.5 MG TOTAL) BY MOUTH 3 (THREE) TIMES DAILY AS NEEDED FOR ANXIETY., Disp: 60 tablet, Rfl: 5   Cholecalciferol (VITAMIN D PO), Take by mouth daily., Disp: , Rfl:    fluticasone (FLONASE) 50 MCG/ACT nasal spray, Place 1 spray into both nostrils daily. (Patient not taking: No sig reported), Disp: 16 g, Rfl: 0   loratadine (CLARITIN) 10 MG tablet, Take 10 mg by mouth daily., Disp: , Rfl:    sertraline (ZOLOFT) 100 MG tablet, Take 1.5 tablets (150 mg total) by mouth daily., Disp: 135 tablet, Rfl: 2  EXAM:  VITALS per patient if applicable:  RR between 12-20 bpm  GENERAL: alert, oriented, appears well and in no acute distress  HEENT: atraumatic, conjunctiva clear, no obvious abnormalities on inspection of external nose and ears  NECK: normal movements of the head and neck  LUNGS: on inspection no signs of respiratory distress, breathing rate appears normal, no obvious gross SOB, gasping or wheezing  CV: no obvious cyanosis  MS: moves all visible extremities without noticeable abnormality  PSYCH/NEURO: pleasant and cooperative, no obvious depression or anxiety, speech and thought processing grossly intact  ASSESSMENT AND  PLAN:  Discussed the following assessment and plan:  COVID-19 virus infection -Positive at home COVID test 7/17 with symptoms starting on 7/16 -Continue supportive care including Tylenol, rest, hydration, OTC cough/cold medications -Given strict precautions  Numbness and tingling -likely 2/2 vitamin or electrolyte deficiency from decreased appetite -MVI or separate supplement with Zn, Magnesium, Vitamin C, and B12 -for continued symptoms over the wknd obtain labs on Monday.  Follow-up as needed  I discussed the assessment and treatment plan with the patient. The  patient was provided an opportunity to ask questions and all were answered. The patient agreed with the plan and demonstrated an understanding of the instructions.   The patient was advised to call back or seek an in-person evaluation if the symptoms worsen or if the condition fails to improve as anticipated.  Billie Ruddy, MD

## 2021-04-06 ENCOUNTER — Ambulatory Visit: Payer: BLUE CROSS/BLUE SHIELD | Admitting: Psychiatry

## 2021-04-07 ENCOUNTER — Ambulatory Visit (INDEPENDENT_AMBULATORY_CARE_PROVIDER_SITE_OTHER): Payer: 59 | Admitting: Psychiatry

## 2021-04-07 ENCOUNTER — Encounter: Payer: Self-pay | Admitting: Psychiatry

## 2021-04-07 ENCOUNTER — Other Ambulatory Visit: Payer: Self-pay

## 2021-04-07 DIAGNOSIS — F4001 Agoraphobia with panic disorder: Secondary | ICD-10-CM

## 2021-04-07 DIAGNOSIS — F411 Generalized anxiety disorder: Secondary | ICD-10-CM | POA: Diagnosis not present

## 2021-04-07 DIAGNOSIS — F5105 Insomnia due to other mental disorder: Secondary | ICD-10-CM | POA: Diagnosis not present

## 2021-04-07 DIAGNOSIS — R69 Illness, unspecified: Secondary | ICD-10-CM | POA: Diagnosis not present

## 2021-04-07 MED ORDER — ALPRAZOLAM 0.5 MG PO TABS: 0.5000 mg | ORAL_TABLET | Freq: Three times a day (TID) | ORAL | 5 refills | Status: DC | PRN

## 2021-04-07 MED ORDER — SERTRALINE HCL 100 MG PO TABS: 200.0000 mg | ORAL_TABLET | Freq: Every day | ORAL | 1 refills | Status: DC

## 2021-04-07 NOTE — Progress Notes (Signed)
HIBAH GIACONA [id number] [birth year] 45 y.o.  Subjective:   Patient ID:  Alexandra Welch is a 45 y.o. (DOB Dec 05, 1975) female.  Chief Complaint:  Chief Complaint  Patient presents with   Follow-up   Panic disorder with agoraphobia   Stress    HPI Alexandra Welch presents to the office today for follow-up of panic disorder, generalized anxiety disorder, and insomnia.  She was first seen on October 15, 2018.  visit January 28, 2019.  She was having continued panic attacks but they overall were much improved on sertraline 100 mg versus Lexapro.  However she was still having some nocturnal panic attacks as well so we decided to increase sertraline to 150 mg daily.  visit May 14, 2019.  She was still having 1-2 panic attacks on sertraline 150 mg daily but that was improved.  She was encouraged to increase it to 200 mg daily to try to completely suppress the panic but she chose not to do so.  seen Sep 10, 2019 with the following noted and no meds changed. Panic a little better about once per month.  Nocturnal only now.  NM are not the cause.    Better control of them but last 10-15 mins.  Not depressed generally.  Circumstantial anxiety noted.  Sleep 6 hours.  Awakes 2-3 AM and can be awake 1 1/2 hours.  Gets up 6 am.  Some tiredness in the afternoon.    H still on chemo and not doing well. and doesn't sleep well and that's stressful.  He's still not doing well and this causes anxiety.  He doesn't sleep well.  A few panic but less and can still be out of the blue and are usually those are nocturnal.  No consistent NM.  She is only having nocturnal panic attacks at this time.  Overrate all anxiety is okay with the exception of worry about her husband.  She does not sleep enough and has early morning awakening.  It is not always associated with anxiety.  Denies appetite disturbance.  Patient reports that energy and motivation have been good.  Patient denies any difficulty with concentration.   Patient denies any suicidal ideation.  01/06/20 appt with the following noted: Still on sertaline 150 and Xanax 0.5 mg HS only usually.  Sleep is a little better than last time.  Overall good with less panic.  2-3 in 4 mos.  Situational anxiety.   H cancer is primary source of anxiety.  Also kids moving an having babies create anxiety but expected.  No avoidance bc most panic was nocturnal. No NM.  No depression normally. Plan: She decided to continue the current dose of sertraline 150 mg daily  Option increase sertraline  07/07/2020 appointment with the following noted: Rare daytime Xanax. Needs it for sleep. Pretty good ovrall except situational anxiety.  No panic in mos. A lot of HA but may be anxiety related.  Can have for a couple days and resolve. H cancer is primary source of anxiety and not doing well, back on IV chemo. He's been really sick 2with this one and not working. Still some awakening but not with panic.  Not depressed. No SE. Plan: She decided to continue the current dose of sertraline 150 mg daily  Panic stopped and anxiety is manageable.  04/07/2021 appointment the following noted: Anxiety  up  and down and about the same.  Couple sporadic middle of night panic.  More general anxiety than panic.  No avoidance and  most of the time is fine.  Usually situational. H CA and not doing well.  3 desmoid tumos in abdomen. Xanax mostly just at HS bc needed to help her go and stay asleep. No SE. Some obsessive thoughts that something might happen to family if they get in a car.  No caffeine.  No history Alc drug problems.   Past Psychiatric Medication Trials:  Ativan tiredness,  Lexapro, paxil years agoSE, sertraline 150 Trazodone hangover M takes Klonopin HS Sertraline 150  Review of Systems:  Review of Systems  Cardiovascular:  Positive for palpitations. Negative for chest pain.       Normal cardiac workup.  Palpitations with anxiety  Neurological:  Negative for  dizziness, tremors and weakness.   Medications: I have reviewed the patient's current medications.  Current Outpatient Medications  Medication Sig Dispense Refill   acetaminophen (TYLENOL) 325 MG tablet Take 650 mg by mouth every 6 (six) hours as needed.     albuterol (VENTOLIN HFA) 108 (90 Base) MCG/ACT inhaler Inhale 2 puffs into the lungs 2 (two) times daily as needed. 3.7 g 1   Cholecalciferol (VITAMIN D PO) Take by mouth daily.     loratadine (CLARITIN) 10 MG tablet Take 10 mg by mouth daily.     ALPRAZolam (XANAX) 0.5 MG tablet Take 1 tablet (0.5 mg total) by mouth 3 (three) times daily as needed. for anxiety 60 tablet 5   sertraline (ZOLOFT) 100 MG tablet Take 2 tablets (200 mg total) by mouth daily. 180 tablet 1   No current facility-administered medications for this visit.    Medication Side Effects: None  Allergies:  Allergies  Allergen Reactions   Doxycycline Swelling and Rash    Mild upper lip swelling, facial rash.    Shellfish Allergy Swelling   Nsaids Other (See Comments)    All have made her tongue itch    Past Medical History:  Diagnosis Date   Anxiety    Asthma    Basal cell carcinoma of skin    Dr. Derrel Nip   Fibrocystic breast changes 08/27/2014   No hx breast biopsy. No FH first degree relative with breast or ovarian ca. Getting yearly mammograms per prior PCP recs.    PVC (premature ventricular contraction)    negative cardiac work up    Family History  Problem Relation Age of Onset   Asthma Mother    Anxiety disorder Mother    Post-traumatic stress disorder Mother    Lung cancer Father        smoker and worked in [place] disorder Sister    Anxiety disorder Sister    Suicidality Brother        gambling died by suicide   Obsessive Compulsive Disorder Son     Social History   Socioeconomic History   Marital status: Married    Spouse name: Pilar Plate   Number of children: 2   Years of education: Not on file   Highest education  level: Not on file  Occupational History   Not on file  Tobacco Use   Smoking status: Never   Smokeless tobacco: Never  Vaping Use   Vaping Use: Never used  Substance and Sexual Activity   Alcohol use: Not Currently   Drug use: No   Sexual activity: Not on file    Comment: hysterectomy  Other Topics Concern   Not on file  Social History Narrative   Work or School: Scientist, clinical (histocompatibility and immunogenetics) [employer]  Home Situation: lives with husband and two older children      Spiritual Beliefs: Baptist      Lifestyle: exercise is sporadic; diet is ok      Social Determinants of Radio broadcast assistant Strain: Not on file  Food Insecurity: Not on file  Transportation Needs: Not on file  Physical Activity: Not on file  Stress: Not on file  Social Connections: Not on file  Intimate Partner Violence: Not on file    Past Medical History, Surgical history, Social history, and Family history were reviewed and updated as appropriate.   Please see review of systems for further details on the patient's review from today.   Objective:   Physical Exam:  LMP 08/29/2005 (Approximate)   Physical Exam Constitutional:      General: She is not in acute distress. Musculoskeletal:        General: No deformity.  Neurological:     Mental Status: She is alert and oriented to person, place, and time.     Coordination: Coordination normal.  Psychiatric:        Attention and Perception: Attention normal. She does not perceive auditory hallucinations.        Mood and Affect: Mood is anxious. Mood is not depressed. Affect is not labile, blunt, angry or inappropriate.        Speech: Speech normal.        Behavior: Behavior normal.        Thought Content: Thought content normal. Thought content does not include homicidal or suicidal ideation. Thought content does not include homicidal or suicidal plan.        Cognition and Memory: Cognition normal.        Judgment: Judgment normal.     Comments:  Insight intact. No auditory or visual hallucinations. No delusions.  Affect is a little anxious as well as mood  Mild obs of harm    Lab Review:     Component Value Date/Time   NA 139 10/21/2019 0843   K 4.6 10/21/2019 0843   CL 104 10/21/2019 0843   CO2 21 10/21/2019 0843   GLUCOSE 83 10/21/2019 0843   GLUCOSE 78 11/29/2016 1413   BUN 15 10/21/2019 0843   CREATININE 0.83 10/21/2019 0843   CREATININE 0.80 11/29/2016 1413   CALCIUM 9.4 10/21/2019 0843   PROT 6.9 10/21/2019 0843   ALBUMIN 4.3 10/21/2019 0843   AST 17 10/21/2019 0843   ALT 18 10/21/2019 0843   ALKPHOS 43 10/21/2019 0843   BILITOT 0.4 10/21/2019 0843   GFRNONAA 85 10/21/2019 0843   GFRAA 98 10/21/2019 0843       Component Value Date/Time   WBC 6.9 10/21/2019 0843   WBC 6.2 08/12/2013 0857   RBC 4.99 10/21/2019 0843   RBC 4.53 08/12/2013 0857   HGB 14.5 10/21/2019 0843   HCT 42.5 10/21/2019 0843   PLT 332 10/21/2019 0843   MCV 85 10/21/2019 0843   MCH 29.1 10/21/2019 0843   MCHC 34.1 10/21/2019 0843   MCHC 34.1 08/12/2013 0857   RDW 12.6 10/21/2019 0843   LYMPHSABS 1.9 08/12/2013 0857   MONOABS 0.5 08/12/2013 0857   EOSABS 0.3 08/12/2013 0857   BASOSABS 0.0 08/12/2013 0857    No results found for: POCLITH, LITHIUM   No results found for: PHENYTOIN, PHENOBARB, VALPROATE, CBMZ   .res Assessment: Plan:    Panic disorder with agoraphobia - Plan: sertraline (ZOLOFT) 100 MG tablet, ALPRAZolam (XANAX) 0.5 MG tablet  Generalized anxiety disorder -  Plan: sertraline (ZOLOFT) 100 MG tablet  Insomnia due to mental condition   Greater than 50% of 30 min face to face time with patient was spent on counseling and coordination of care.  She believes the major source of her anxiety is her husband's serious cancer. History of nocturnal panic attacks that wake her out of sleep for no apparent reason.    Mild obsessions of harm to family  Rec increase sertraline to max response and help obsessional  thoughts She decided to continue increase dose sertraline to 200 mg daily for residual anxiety and obs   We discussed the pros and cons of the Xanax usage and she is not using it excessively.  Is hoped that she will be able to gradually discontinue that except situationally.  She does have some significant situational anxiety over the health of her husband.  Supportive therapy around that issue.   Patient's insomnia failed to respond to trazodone because of hangover. Sleep better with Xanax She has continued early morning awakening and decreased quantity of sleep.  This is probably contributing to her not feeling as well during the day.  Discussed at length the pros and cons of using the Xanax on a consistent basis at night.  She has a family history of chronic insomnia.  We discussed her absence of history of drug and alcohol abuse.  We discussed the tolerance issues around benzodiazepines when used chronically for sleep but they can be used successfully at times as is the case with her mother.  We discussed withdrawal symptoms.  It is unlikely that sleep agents that do not address anxiety will help her sleep.    Follow-up 6 months  Hiram Comber, MD, DFAPA  Please see After Visit Summary for patient specific instructions.  Future Appointments  Date Time Provider [place]  01/03/2022  2:45 PM Megan Salon, MD [facility]    No orders of the defined types were placed in this encounter.     -------------------------------

## 2021-04-26 ENCOUNTER — Telehealth: Payer: Self-pay | Admitting: Family Medicine

## 2021-04-26 DIAGNOSIS — J4520 Mild intermittent asthma, uncomplicated: Secondary | ICD-10-CM

## 2021-04-26 MED ORDER — ALBUTEROL SULFATE HFA 108 (90 BASE) MCG/ACT IN AERS: 2.0000 | INHALATION_SPRAY | Freq: Two times a day (BID) | RESPIRATORY_TRACT | 1 refills | Status: DC | PRN

## 2021-04-26 NOTE — Telephone Encounter (Signed)
Patient needs a refill on albuterol (VENTOLIN HFA) 108 (90 Base) MCG/ACT inhaler     Please send to  [id number] [place] [place] Phone:  [phone] Fax:  [phone]    Good callback number is [phone]   Please Advise

## 2021-06-30 DIAGNOSIS — C44519 Basal cell carcinoma of skin of other part of trunk: Secondary | ICD-10-CM | POA: Diagnosis not present

## 2021-07-06 ENCOUNTER — Other Ambulatory Visit: Payer: Self-pay

## 2021-07-07 ENCOUNTER — Ambulatory Visit: Payer: 59 | Admitting: Family Medicine

## 2021-07-07 ENCOUNTER — Ambulatory Visit (INDEPENDENT_AMBULATORY_CARE_PROVIDER_SITE_OTHER): Payer: 59

## 2021-07-07 DIAGNOSIS — Z23 Encounter for immunization: Secondary | ICD-10-CM | POA: Diagnosis not present

## 2021-07-20 DIAGNOSIS — C44519 Basal cell carcinoma of skin of other part of trunk: Secondary | ICD-10-CM | POA: Diagnosis not present

## 2021-08-09 ENCOUNTER — Other Ambulatory Visit (HOSPITAL_BASED_OUTPATIENT_CLINIC_OR_DEPARTMENT_OTHER): Payer: Self-pay | Admitting: Obstetrics & Gynecology

## 2021-08-09 DIAGNOSIS — Z1211 Encounter for screening for malignant neoplasm of colon: Secondary | ICD-10-CM

## 2021-08-27 ENCOUNTER — Encounter: Payer: Self-pay | Admitting: Gastroenterology

## 2021-09-14 ENCOUNTER — Ambulatory Visit (AMBULATORY_SURGERY_CENTER): Payer: 59 | Admitting: *Deleted

## 2021-09-14 ENCOUNTER — Other Ambulatory Visit: Payer: Self-pay

## 2021-09-14 VITALS — Ht 63.0 in | Wt 155.0 lb

## 2021-09-14 DIAGNOSIS — Z1211 Encounter for screening for malignant neoplasm of colon: Secondary | ICD-10-CM

## 2021-09-14 MED ORDER — NA SULFATE-K SULFATE-MG SULF 17.5-3.13-1.6 GM/177ML PO SOLN: 2.0000 | Freq: Once | ORAL | 0 refills | Status: AC

## 2021-09-14 NOTE — Progress Notes (Signed)

## 2021-09-22 ENCOUNTER — Encounter: Payer: Self-pay | Admitting: Gastroenterology

## 2021-09-28 ENCOUNTER — Encounter: Payer: Self-pay | Admitting: Gastroenterology

## 2021-09-28 ENCOUNTER — Other Ambulatory Visit: Payer: Self-pay

## 2021-09-28 ENCOUNTER — Ambulatory Visit (AMBULATORY_SURGERY_CENTER): Payer: 59 | Admitting: Gastroenterology

## 2021-09-28 VITALS — BP 100/63 | HR 68 | Temp 98.6°F | Resp 18 | Ht 62.0 in | Wt 155.0 lb

## 2021-09-28 DIAGNOSIS — K635 Polyp of colon: Secondary | ICD-10-CM

## 2021-09-28 DIAGNOSIS — Z1211 Encounter for screening for malignant neoplasm of colon: Secondary | ICD-10-CM

## 2021-09-28 DIAGNOSIS — D124 Benign neoplasm of descending colon: Secondary | ICD-10-CM | POA: Diagnosis not present

## 2021-09-28 MED ORDER — SODIUM CHLORIDE 0.9 % IV SOLN: 500.0000 mL | Freq: Once | INTRAVENOUS | Status: DC

## 2021-09-28 NOTE — Progress Notes (Signed)
PT taken to PACU. Monitors in place. VSS. Report given to RN. 

## 2021-09-28 NOTE — Progress Notes (Signed)
[facility] History and Physical   Primary Care Physician:  Billie Ruddy, MD   Reason for Procedure:   Colon cancer screening  Plan:    Screening colonoscopy     HPI: Alexandra Welch is a 46 y.o. female undergoing initial average risk screening colonoscopy.  She has no family history of colon cancer and no chronic GI symptoms.  She has occasional bright red blood on the toilet paper.   Past Medical History:  Diagnosis Date   Anxiety    Asthma    Basal cell carcinoma of skin    Dr. Derrel Nip   Fibrocystic breast changes 08/27/2014   No hx breast biopsy. No FH first degree relative with breast or ovarian ca. Getting yearly mammograms per prior PCP recs.    GERD (gastroesophageal reflux disease)    PVC (premature ventricular contraction)    negative cardiac work up    Past Surgical History:  Procedure Laterality Date   LAPAROSCOPIC HYSTERECTOMY  2007   for cervical dysplasia    Prior to Admission medications   Medication Sig Start Date End Date Taking? Authorizing Provider  acetaminophen (TYLENOL) 325 MG tablet Take 650 mg by mouth every 6 (six) hours as needed.   Yes [provider]  albuterol (VENTOLIN HFA) 108 (90 Base) MCG/ACT inhaler Inhale 2 puffs into the lungs 2 (two) times daily as needed. 04/26/21   Billie Ruddy, MD  ALPRAZolam Duanne Moron) 0.5 MG tablet Take 1 tablet (0.5 mg total) by mouth 3 (three) times daily as needed. for anxiety 04/07/21   Cottle, Billey Co., MD  Cholecalciferol (VITAMIN D PO) Take by mouth daily. Patient not taking: Reported on 09/14/2021    [provider]  loratadine (CLARITIN) 10 MG tablet Take 10 mg by mouth daily.    [provider]  sertraline (ZOLOFT) 100 MG tablet Take 2 tablets (200 mg total) by mouth daily. 04/07/21   Cottle, Billey Co., MD    Current Outpatient Medications  Medication Sig Dispense Refill   acetaminophen (TYLENOL) 325 MG tablet Take 650 mg by mouth every 6 (six) hours  as needed.     albuterol (VENTOLIN HFA) 108 (90 Base) MCG/ACT inhaler Inhale 2 puffs into the lungs 2 (two) times daily as needed. 3.7 g 1   ALPRAZolam (XANAX) 0.5 MG tablet Take 1 tablet (0.5 mg total) by mouth 3 (three) times daily as needed. for anxiety 60 tablet 5   Cholecalciferol (VITAMIN D PO) Take by mouth daily. (Patient not taking: Reported on 09/14/2021)     loratadine (CLARITIN) 10 MG tablet Take 10 mg by mouth daily.     sertraline (ZOLOFT) 100 MG tablet Take 2 tablets (200 mg total) by mouth daily. 180 tablet 1   Current Facility-Administered Medications  Medication Dose Route Frequency Provider Last Rate Last Admin   0.9 %  sodium chloride infusion  500 mL Intravenous Once Daryel November, MD        Allergies as of 09/28/2021 - Review Complete 09/28/2021  Allergen Reaction Noted   Doxycycline Swelling and Rash 06/11/2020   Shellfish allergy Swelling 08/27/2014   Nsaids Other (See Comments) 03/16/2021    Family History  Problem Relation Age of Onset   Asthma Mother    Anxiety disorder Mother    Post-traumatic stress disorder Mother    Lung cancer Father        smoker and worked in [place] disorder Sister    Anxiety disorder Sister  Suicidality Brother        gambling died by suicide   Esophageal cancer Paternal Uncle    Obsessive Compulsive Disorder Son    Colon cancer Neg Hx    Colon polyps Neg Hx    Stomach cancer Neg Hx    Rectal cancer Neg Hx     Social History   Socioeconomic History   Marital status: Married    Spouse name: Pilar Plate   Number of children: 2   Years of education: Not on file   Highest education level: Not on file  Occupational History   Not on file  Tobacco Use   Smoking status: Never   Smokeless tobacco: Never  Vaping Use   Vaping Use: Never used  Substance and Sexual Activity   Alcohol use: Not Currently   Drug use: No   Sexual activity: Not on file    Comment: hysterectomy  Other Topics Concern   Not on  file  Social History Narrative   Work or School: Scientist, clinical (histocompatibility and immunogenetics) [employer]      Home Situation: lives with husband and two older children      Spiritual Beliefs: Baptist      Lifestyle: exercise is sporadic; diet is ok      Social Determinants of Radio broadcast assistant Strain: Not on file  Food Insecurity: Not on file  Transportation Needs: Not on file  Physical Activity: Not on file  Stress: Not on file  Social Connections: Not on file  Intimate Partner Violence: Not on file    Review of Systems:  All other review of systems negative except as mentioned in the HPI.  Physical Exam: Vital signs BP 134/80    Pulse (!) 110    Temp 98.6 F (37 C)    Ht 5\' 2"  (1.575 m)    Wt 155 lb (70.3 kg)    LMP 08/29/2005 (Approximate)    SpO2 100%    BMI 28.35 kg/m   General:   Alert,  Well-developed, well-nourished, pleasant and cooperative in NAD Airway:  Mallampati 2 Lungs:  Clear throughout to auscultation.   Heart:  Regular rate and rhythm; no murmurs, clicks, rubs,  or gallops. Abdomen:  Soft, nontender and nondistended. Normal bowel sounds.   Neuro/Psych:  Normal mood and affect. A and O x 3   Eiko Mcgowen E. Candis Schatz, MD [facility]

## 2021-09-28 NOTE — Patient Instructions (Signed)
Read all of the handouts given to you by your recovery room nurse. ? ?YOU HAD AN ENDOSCOPIC PROCEDURE TODAY AT THE [facility]:   Refer to the procedure report that was given to you for any specific questions about what was found during the examination.  If the procedure report does not answer your questions, please call your gastroenterologist to clarify.  If you requested that your care partner not be given the details of your procedure findings, then the procedure report has been included in a sealed envelope for you to review at your convenience later. ? ?YOU SHOULD EXPECT: Some feelings of bloating in the abdomen. Passage of more gas than usual.  Walking can help get rid of the air that was put into your GI tract during the procedure and reduce the bloating. If you had a lower endoscopy (such as a colonoscopy or flexible sigmoidoscopy) you may notice spotting of blood in your stool or on the toilet paper. If you underwent a bowel prep for your procedure, you may not have a normal bowel movement for a few days. ? ?Please Note:  You might notice some irritation and congestion in your nose or some drainage.  This is from the oxygen used during your procedure.  There is no need for concern and it should clear up in a day or so. ? ?SYMPTOMS TO REPORT IMMEDIATELY: ? ?Following lower endoscopy (colonoscopy or flexible sigmoidoscopy): ? Excessive amounts of blood in the stool ? Significant tenderness or worsening of abdominal pains ? Swelling of the abdomen that is new, acute ? Fever of 100?F or higher ? ?  ?For urgent or emergent issues, a gastroenterologist can be reached at any hour by calling [phone]. ?Do not use MyChart messaging for urgent concerns.  ? ? ?DIET:  We do recommend a small meal at first, but then you may proceed to your regular diet.  Drink plenty of fluids but you should avoid alcoholic beverages for 24 hours. ? ?ACTIVITY:  You should plan to take it easy for the rest of today  and you should NOT DRIVE or use heavy machinery until tomorrow (because of the sedation medicines used during the test).   ? ?FOLLOW UP: ?Our staff will call the number listed on your records 48-72 hours following your procedure to check on you and address any questions or concerns that you may have regarding the information given to you following your procedure. If we do not reach you, we will leave a message.  We will attempt to reach you two times.  During this call, we will ask if you have developed any symptoms of COVID 19. If you develop any symptoms (ie: fever, flu-like symptoms, shortness of breath, cough etc.) before then, please call [phone].  If you test positive for Covid 19 in the 2 weeks post procedure, please call and report this information to us.   ? ?If any biopsies were taken you will be contacted by phone or by letter within the next 1-3 weeks.  Please call us at [phone] if you have not heard about the biopsies in 3 weeks.  ? ? ?SIGNATURES/CONFIDENTIALITY: ?You and/or your care partner have signed paperwork which will be entered into your electronic medical record.  These signatures attest to the fact that that the information above on your After Visit Summary has been reviewed and is understood.  Full responsibility of the confidentiality of this discharge information lies with you and/or your care-partner.  ?

## 2021-09-28 NOTE — Progress Notes (Signed)
Called to room to assist during endoscopic procedure.  Patient ID and intended procedure confirmed with present staff. Received instructions for my participation in the procedure from the performing physician.  

## 2021-09-28 NOTE — Op Note (Addendum)
[facility] Patient Name: Alexandra Welch Procedure Date: 09/28/2021 10:10 AM MRN: [id number] Endoscopist: Nicki Reaper E. Candis Schatz , MD Age: 46 Referring MD:  Date of Birth: 07/16/1976 Gender: Female Account #: [id number] Procedure:                Colonoscopy Indications:              Screening for colorectal malignant neoplasm, This                            is the patient's first colonoscopy Medicines:                Monitored Anesthesia Care Procedure:                Pre-Anesthesia Assessment:                           - Prior to the procedure, a History and Physical                            was performed, and patient medications and                            allergies were reviewed. The patient's tolerance of                            previous anesthesia was also reviewed. The risks                            and benefits of the procedure and the sedation                            options and risks were discussed with the patient.                            All questions were answered, and informed consent                            was obtained. Prior Anticoagulants: The patient has                            taken no previous anticoagulant or antiplatelet                            agents. ASA Grade Assessment: II - A patient with                            mild systemic disease. After reviewing the risks                            and benefits, the patient was deemed in                            satisfactory condition to undergo the procedure.  After obtaining informed consent, the colonoscope                            was passed under direct vision. Throughout the                            procedure, the patient's blood pressure, pulse, and                            oxygen saturations were monitored continuously. The                            Olympus CF-HQ190L [id number]) Colonoscope was                            introduced through the  anus and advanced to the the                            terminal ileum, with identification of the                            appendiceal orifice and IC valve. The colonoscopy                            was performed without difficulty. The patient                            tolerated the procedure well. The quality of the                            bowel preparation was good. The terminal ileum,                            ileocecal valve, appendiceal orifice, and rectum                            were photographed. The bowel preparation used was                            SUPREP via split dose instruction. Scope In: 10:31:37 AM Scope Out: 10:46:40 AM Total Procedure Duration: 0 hours 15 minutes 3 seconds  Findings:                 The perianal and digital rectal examinations were                            normal. Pertinent negatives include normal                            sphincter tone and no palpable rectal lesions.                           A 3 mm polyp was found in the descending colon. The  polyp was sessile. The polyp was removed with a                            cold snare. Resection and retrieval were complete.                            Estimated blood loss was minimal.                           There was a small lipoma, in the ascending colon.                           The exam was otherwise normal throughout the                            examined colon.                           The terminal ileum appeared normal.                           Anal papilla(e) were hypertrophied.                           No additional abnormalities were found on                            retroflexion. Complications:            No immediate complications. Estimated Blood Loss:     Estimated blood loss was minimal. Estimated blood                            loss was minimal. Impression:               - One 3 mm polyp in the descending colon, removed                             with a cold snare. Resected and retrieved.                           - Small lipoma in the ascending colon.                           - The examined portion of the ileum was normal.                           - Anal papilla(e) were hypertrophied. Recommendation:           - Patient has a contact number available for                            emergencies. The signs and symptoms of potential                            delayed complications were discussed with the  patient. Return to normal activities tomorrow.                            Written discharge instructions were provided to the                            patient.                           - Resume previous diet.                           - Continue present medications.                           - Await pathology results.                           - Repeat colonoscopy (date not yet determined) for                            surveillance based on pathology results. Torell Minder E. Candis Schatz, MD 09/28/2021 10:56:23 AM This report has been signed electronically.

## 2021-10-04 ENCOUNTER — Telehealth: Payer: Self-pay | Admitting: Gastroenterology

## 2021-10-04 NOTE — Telephone Encounter (Signed)
Patient called and stated that she had got her colonoscopy results and would like someone to go over them with her. Please advise.

## 2021-10-04 NOTE — Telephone Encounter (Signed)
Pt calling regarding her colonoscopy results. Sees them online. Please advise.

## 2021-10-05 NOTE — Progress Notes (Signed)
Alexandra Welch,  Good news: the polyp (or polyps) that I removed during your recent examination were NOT precancerous.  Lymphoid aggregates are normal, common polypoid collections of lymphoid tissue in the colon.  You should continue to follow current colorectal cancer screening guidelines with a repeat colonoscopy in 10 years.    If you develop any new rectal bleeding, abdominal pain or significant bowel habit changes, please contact me before then.

## 2021-10-11 ENCOUNTER — Other Ambulatory Visit: Payer: Self-pay

## 2021-10-11 ENCOUNTER — Encounter: Payer: Self-pay | Admitting: Psychiatry

## 2021-10-11 ENCOUNTER — Ambulatory Visit (INDEPENDENT_AMBULATORY_CARE_PROVIDER_SITE_OTHER): Payer: 59 | Admitting: Psychiatry

## 2021-10-11 DIAGNOSIS — R69 Illness, unspecified: Secondary | ICD-10-CM | POA: Diagnosis not present

## 2021-10-11 DIAGNOSIS — F4001 Agoraphobia with panic disorder: Secondary | ICD-10-CM

## 2021-10-11 DIAGNOSIS — F5105 Insomnia due to other mental disorder: Secondary | ICD-10-CM | POA: Diagnosis not present

## 2021-10-11 DIAGNOSIS — F411 Generalized anxiety disorder: Secondary | ICD-10-CM

## 2021-10-11 MED ORDER — SERTRALINE HCL 100 MG PO TABS: 200.0000 mg | ORAL_TABLET | Freq: Every day | ORAL | 1 refills | Status: DC

## 2021-10-11 MED ORDER — ALPRAZOLAM 0.5 MG PO TABS: 0.5000 mg | ORAL_TABLET | Freq: Three times a day (TID) | ORAL | 5 refills | Status: DC | PRN

## 2021-10-11 NOTE — Progress Notes (Signed)
Alexandra Welch [id number] Feb 22, 1976 46 y.o.  Subjective:   Patient ID:  Alexandra Welch is a 46 y.o. (DOB December 08, 1975) female.  Chief Complaint:  Chief Complaint  Patient presents with   Follow-up    Panic disorder with agoraphobia   Anxiety    HPI Alexandra Welch presents to the office today for follow-up of panic disorder, generalized anxiety disorder, and insomnia.  She was first seen on October 15, 2018.  visit January 28, 2019.  She was having continued panic attacks but they overall were much improved on sertraline 100 mg versus Lexapro.  However she was still having some nocturnal panic attacks as well so we decided to increase sertraline to 150 mg daily.  visit May 14, 2019.  She was still having 1-2 panic attacks on sertraline 150 mg daily but that was improved.  She was encouraged to increase it to 200 mg daily to try to completely suppress the panic but she chose not to do so.  seen Sep 10, 2019 with the following noted and no meds changed. Panic a little better about once per month.  Nocturnal only now.  NM are not the cause.    Better control of them but last 10-15 mins.  Not depressed generally.  Circumstantial anxiety noted.  Sleep 6 hours.  Awakes 2-3 AM and can be awake 1 1/2 hours.  Gets up 6 am.  Some tiredness in the afternoon.    H still on chemo and not doing well. and doesn't sleep well and that's stressful.  He's still not doing well and this causes anxiety.  He doesn't sleep well.  A few panic but less and can still be out of the blue and are usually those are nocturnal.  No consistent NM.  She is only having nocturnal panic attacks at this time.  Overrate all anxiety is okay with the exception of worry about her husband.  She does not sleep enough and has early morning awakening.  It is not always associated with anxiety.  Denies appetite disturbance.  Patient reports that energy and motivation have been good.  Patient denies any difficulty with concentration.   Patient denies any suicidal ideation.  01/06/20 appt with the following noted: Still on sertaline 150 and Xanax 0.5 mg HS only usually.  Sleep is a little better than last time.  Overall good with less panic.  2-3 in 4 mos.  Situational anxiety.   H cancer is primary source of anxiety.  Also kids moving an having babies create anxiety but expected.  No avoidance bc most panic was nocturnal. No NM.  No depression normally. Plan: She decided to continue the current dose of sertraline 150 mg daily  Option increase sertraline  07/07/2020 appointment with the following noted: Rare daytime Xanax. Needs it for sleep. Pretty good ovrall except situational anxiety.  No panic in mos. A lot of HA but may be anxiety related.  Can have for a couple days and resolve. H cancer is primary source of anxiety and not doing well, back on IV chemo. He's been really sick 2with this one and not working. Still some awakening but not with panic.  Not depressed. No SE. Plan: She decided to continue the current dose of sertraline 150 mg daily  Panic stopped and anxiety is manageable.  04/07/2021 appointment the following noted: Anxiety  up  and down and about the same.  Couple sporadic middle of night panic.  More general anxiety than panic.  No avoidance  and most of the time is fine.  Usually situational. H CA and not doing well.  3 desmoid tumos in abdomen. Xanax mostly just at HS bc needed to help her go and stay asleep. No SE. Some obsessive thoughts that something might happen to family if they get in a car. Plan increase sertraline to 200 mg daily for residual anxiety symptoms.  10/11/2021 appointment with the following noted: Overall improved anxiety with increased sertraline.   More stress last month and a couple of panic attacks but otherwise hase been ok. No SE with sertraline.   Flying phobia helped with Xanax and otherwise limits to HS. Sleep pretty good. No NM Satisfied with sertraline 200 bc less  anxiety and panic  No caffeine.  No history Alc drug problems.   Past Psychiatric Medication Trials:  Ativan tiredness, Xanax Lexapro, paxil years agoSE, sertraline 200 Trazodone hangover M takes Klonopin HS Sertraline 150  Review of Systems:  Review of Systems  Cardiovascular:  Positive for palpitations. Negative for chest pain.       Normal cardiac workup.  Palpitations with anxiety  Neurological:  Negative for dizziness, tremors and weakness.   Medications: I have reviewed the patient's current medications.  Current Outpatient Medications  Medication Sig Dispense Refill   acetaminophen (TYLENOL) 325 MG tablet Take 650 mg by mouth every 6 (six) hours as needed.     albuterol (VENTOLIN HFA) 108 (90 Base) MCG/ACT inhaler Inhale 2 puffs into the lungs 2 (two) times daily as needed. 3.7 g 1   Cholecalciferol (VITAMIN D PO) Take by mouth daily.     loratadine (CLARITIN) 10 MG tablet Take 10 mg by mouth daily.     ALPRAZolam (XANAX) 0.5 MG tablet Take 1 tablet (0.5 mg total) by mouth 3 (three) times daily as needed. for anxiety 60 tablet 5   sertraline (ZOLOFT) 100 MG tablet Take 2 tablets (200 mg total) by mouth daily. 180 tablet 1   No current facility-administered medications for this visit.    Medication Side Effects: None  Allergies:  Allergies  Allergen Reactions   Doxycycline Swelling and Rash    Mild upper lip swelling, facial rash.    Shellfish Allergy Swelling   Nsaids Other (See Comments)    All have made her tongue itch    Past Medical History:  Diagnosis Date   Anxiety    Asthma    Basal cell carcinoma of skin    Dr. Derrel Nip   Fibrocystic breast changes 08/27/2014   No hx breast biopsy. No FH first degree relative with breast or ovarian ca. Getting yearly mammograms per prior PCP recs.    GERD (gastroesophageal reflux disease)    PVC (premature ventricular contraction)    negative cardiac work up    Family History  Problem Relation Age of Onset    Asthma Mother    Anxiety disorder Mother    Post-traumatic stress disorder Mother    Lung cancer Father        smoker and worked in [place] disorder Sister    Anxiety disorder Sister    Suicidality Brother        gambling died by suicide   Esophageal cancer Paternal Uncle    Obsessive Compulsive Disorder Son    Colon cancer Neg Hx    Colon polyps Neg Hx    Stomach cancer Neg Hx    Rectal cancer Neg Hx     Social History   Socioeconomic History  Marital status: Married    Spouse name: Pilar Plate   Number of children: 2   Years of education: Not on file   Highest education level: Not on file  Occupational History   Not on file  Tobacco Use   Smoking status: Never   Smokeless tobacco: Never  Vaping Use   Vaping Use: Never used  Substance and Sexual Activity   Alcohol use: Not Currently   Drug use: No   Sexual activity: Not on file    Comment: hysterectomy  Other Topics Concern   Not on file  Social History Narrative   Work or School: Scientist, clinical (histocompatibility and immunogenetics) [employer]      Home Situation: lives with husband and two older children      Spiritual Beliefs: Baptist      Lifestyle: exercise is sporadic; diet is ok      Social Determinants of Radio broadcast assistant Strain: Not on file  Food Insecurity: Not on file  Transportation Needs: Not on file  Physical Activity: Not on file  Stress: Not on file  Social Connections: Not on file  Intimate Partner Violence: Not on file    Past Medical History, Surgical history, Social history, and Family history were reviewed and updated as appropriate.   Please see review of systems for further details on the patient's review from today.   Objective:   Physical Exam:  LMP 08/29/2005 (Approximate)   Physical Exam Constitutional:      General: She is not in acute distress. Musculoskeletal:        General: No deformity.  Neurological:     Mental Status: She is alert and oriented to person, place, and time.      Coordination: Coordination normal.  Psychiatric:        Attention and Perception: Attention normal. She does not perceive auditory hallucinations.        Mood and Affect: Mood is anxious. Mood is not depressed. Affect is not labile, blunt, angry or inappropriate.        Speech: Speech normal.        Behavior: Behavior normal.        Thought Content: Thought content normal. Thought content does not include homicidal or suicidal ideation. Thought content does not include homicidal or suicidal plan.        Cognition and Memory: Cognition normal.        Judgment: Judgment normal.     Comments: Insight intact. No auditory or visual hallucinations. No delusions.  Affect is a little anxious as well as mood  Mild obs of harm    Lab Review:     Component Value Date/Time   NA 139 10/21/2019 0843   K 4.6 10/21/2019 0843   CL 104 10/21/2019 0843   CO2 21 10/21/2019 0843   GLUCOSE 83 10/21/2019 0843   GLUCOSE 78 11/29/2016 1413   BUN 15 10/21/2019 0843   CREATININE 0.83 10/21/2019 0843   CREATININE 0.80 11/29/2016 1413   CALCIUM 9.4 10/21/2019 0843   PROT 6.9 10/21/2019 0843   ALBUMIN 4.3 10/21/2019 0843   AST 17 10/21/2019 0843   ALT 18 10/21/2019 0843   ALKPHOS 43 10/21/2019 0843   BILITOT 0.4 10/21/2019 0843   GFRNONAA 85 10/21/2019 0843   GFRAA 98 10/21/2019 0843       Component Value Date/Time   WBC 6.9 10/21/2019 0843   WBC 6.2 08/12/2013 0857   RBC 4.99 10/21/2019 0843   RBC 4.53 08/12/2013 0857   HGB  14.5 10/21/2019 0843   HCT 42.5 10/21/2019 0843   PLT 332 10/21/2019 0843   MCV 85 10/21/2019 0843   MCH 29.1 10/21/2019 0843   MCHC 34.1 10/21/2019 0843   MCHC 34.1 08/12/2013 0857   RDW 12.6 10/21/2019 0843   LYMPHSABS 1.9 08/12/2013 0857   MONOABS 0.5 08/12/2013 0857   EOSABS 0.3 08/12/2013 0857   BASOSABS 0.0 08/12/2013 0857    No results found for: POCLITH, LITHIUM   No results found for: PHENYTOIN, PHENOBARB, VALPROATE, CBMZ   .res Assessment: Plan:     Generalized anxiety disorder - Plan: sertraline (ZOLOFT) 100 MG tablet  Panic disorder with agoraphobia - Plan: sertraline (ZOLOFT) 100 MG tablet, ALPRAZolam (XANAX) 0.5 MG tablet  Insomnia due to mental condition   Greater than 50% of 30 min face to face time with patient was spent on counseling and coordination of care.  She believes the major source of her anxiety is her husband's serious cancer. History of nocturnal panic attacks that wake her out of sleep for no apparent reason.    Mild obsessions of harm to family  Rec increase sertraline to max response and help obsessional thoughts She decided to continue increase dose sertraline to 200 mg daily for residual anxiety and obs   We discussed the pros and cons of the Xanax usage and she is not using it excessively.  Is hoped that she will be able to gradually discontinue that except situationally.  She does have some significant situational anxiety over the health of her husband.  Supportive therapy around that issue.   Patient's insomnia failed to respond to trazodone because of hangover. Sleep better with Xanax She has continued early morning awakening and decreased quantity of sleep.  This is probably contributing to her not feeling as well during the day.  Discussed at length the pros and cons of using the Xanax on a consistent basis at night.  She has a family history of chronic insomnia.  We discussed her absence of history of drug and alcohol abuse.  We discussed the tolerance issues around benzodiazepines when used chronically for sleep but they can be used successfully at times as is the case with her mother.  We discussed withdrawal symptoms.  It is unlikely that sleep agents that do not address anxiety will help her sleep.    Follow-up 6 months  Hiram Comber, MD, DFAPA  Please see After Visit Summary for patient specific instructions.  Future Appointments  Date Time Provider [place]  01/05/2022  2:15 PM Megan Salon, MD  [facility]    No orders of the defined types were placed in this encounter.      -------------------------------

## 2022-01-03 ENCOUNTER — Ambulatory Visit (HOSPITAL_BASED_OUTPATIENT_CLINIC_OR_DEPARTMENT_OTHER): Payer: 59 | Admitting: Obstetrics & Gynecology

## 2022-01-05 ENCOUNTER — Ambulatory Visit (INDEPENDENT_AMBULATORY_CARE_PROVIDER_SITE_OTHER): Payer: 59 | Admitting: Obstetrics & Gynecology

## 2022-01-05 ENCOUNTER — Encounter (HOSPITAL_BASED_OUTPATIENT_CLINIC_OR_DEPARTMENT_OTHER): Payer: Self-pay | Admitting: Obstetrics & Gynecology

## 2022-01-05 VITALS — BP 145/99 | HR 91 | Ht 62.0 in | Wt 167.0 lb

## 2022-01-05 DIAGNOSIS — Z01419 Encounter for gynecological examination (general) (routine) without abnormal findings: Secondary | ICD-10-CM

## 2022-01-05 DIAGNOSIS — Z1231 Encounter for screening mammogram for malignant neoplasm of breast: Secondary | ICD-10-CM

## 2022-01-05 DIAGNOSIS — Z23 Encounter for immunization: Secondary | ICD-10-CM | POA: Diagnosis not present

## 2022-01-05 DIAGNOSIS — F411 Generalized anxiety disorder: Secondary | ICD-10-CM | POA: Diagnosis not present

## 2022-01-05 DIAGNOSIS — R030 Elevated blood-pressure reading, without diagnosis of hypertension: Secondary | ICD-10-CM | POA: Diagnosis not present

## 2022-01-05 DIAGNOSIS — N6019 Diffuse cystic mastopathy of unspecified breast: Secondary | ICD-10-CM

## 2022-01-05 DIAGNOSIS — Z90710 Acquired absence of both cervix and uterus: Secondary | ICD-10-CM

## 2022-01-05 DIAGNOSIS — Z0000 Encounter for general adult medical examination without abnormal findings: Secondary | ICD-10-CM

## 2022-01-05 DIAGNOSIS — R69 Illness, unspecified: Secondary | ICD-10-CM | POA: Diagnosis not present

## 2022-01-05 DIAGNOSIS — Z87410 Personal history of cervical dysplasia: Secondary | ICD-10-CM | POA: Diagnosis not present

## 2022-01-05 NOTE — Progress Notes (Signed)
46 y.o. [id number] Married White or Caucasian female here for annual exam.  Mother passed away three weeks ago.  Had pneumonia and just never got better.  She had Covid last year and the specialists thought she had chronic lung disease after having Covid.   ? ?Denies vaginal bleeding.   ? ?She is back in school working on an emergency medicine degree.  She will be done at the end of August.   ? ?Husband is still off chemo and being monitored.  He is about the same.  ? ?Takes blood pressures at home and these are always normal.    ? ?Patient's last menstrual period was 08/29/2005 (approximate).          ?Sexually active: No.  ?The current method of family planning is status post hysterectomy.    ? ?The pregnancy intention screening data noted above was reviewed. Potential methods of contraception were discussed. The patient elected to proceed with No data recorded.  ?Exercising: Yes.     Walking 3-4 miles almost every day ?Smoker:  no ? 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 ? reports that she has never smoked. She has never used smokeless tobacco. She reports that she does not currently use alcohol. She reports that she does not use drugs. ? ?Past Medical History:  ?Diagnosis Date  ? Anxiety   ? Asthma   ? Basal cell carcinoma of skin   ? Dr. Derrel Nip  ? Fibrocystic breast changes 08/27/2014  ? No hx breast biopsy. No FH first degree relative with breast or ovarian ca. Getting yearly mammograms per prior PCP recs.   ? GERD (gastroesophageal reflux disease)   ? PVC (premature ventricular contraction)   ? negative cardiac work up  ? ? ?Past Surgical History:  ?Procedure Laterality Date  ? LAPAROSCOPIC HYSTERECTOMY  2007  ? for cervical dysplasia  ? ? ?Current Outpatient Medications  ?Medication Sig Dispense Refill  ? acetaminophen (TYLENOL) 325 MG  tablet Take 650 mg by mouth every 6 (six) hours as needed.    ? albuterol (VENTOLIN HFA) 108 (90 Base) MCG/ACT inhaler Inhale 2 puffs into the lungs 2 (two) times daily as needed. 3.7 g 1  ? ALPRAZolam (XANAX) 0.5 MG tablet Take 1 tablet (0.5 mg total) by mouth 3 (three) times daily as needed. for anxiety 60 tablet 5  ? Cholecalciferol (VITAMIN D PO) Take by mouth daily.    ? loratadine (CLARITIN) 10 MG tablet Take 10 mg by mouth daily.    ? sertraline (ZOLOFT) 100 MG tablet Take 2 tablets (200 mg total) by mouth daily. 180 tablet 1  ? ?No current facility-administered medications for this visit.  ? ? ?Family History  ?Problem Relation Age of Onset  ? Asthma Mother   ? Anxiety disorder Mother   ? Post-traumatic stress disorder Mother   ? Lung cancer Father   ?     smoker and worked in [place]  ? Anxiety disorder Sister   ? Anxiety disorder Sister   ? Suicidality Brother   ?     gambling died by suicide  ? Esophageal cancer Paternal Uncle   ? Obsessive Compulsive Disorder Son   ? Colon cancer Neg Hx   ? Colon polyps Neg Hx   ? Stomach cancer Neg Hx   ? Rectal cancer Neg Hx   ? ? ?  Review of systems not obtained due to patient factors. ? ?Exam:   ?BP (!) 145/99 (BP Location: Left Arm, Patient Position: Sitting, Cuff Size: Normal)   Pulse 91   Ht '5\' 2"'$  (1.575 m) Comment: reported  Wt 167 lb (75.8 kg)   LMP 08/29/2005 (Approximate)   BMI 30.54 kg/m?   Height: '5\' 2"'$  (157.5 cm) (reported) ? ?General appearance: alert, cooperative and appears stated age ?Head: Normocephalic, without obvious abnormality, atraumatic ?Neck: no adenopathy, supple, symmetrical, trachea midline and thyroid normal to inspection and palpation ?Lungs: clear to auscultation bilaterally ?Breasts: normal appearance, no masses or tenderness ?Heart: regular rate and rhythm ?Abdomen: soft, non-tender; bowel sounds normal; no masses,  no organomegaly ?Extremities: extremities normal, atraumatic, no cyanosis or edema ?Skin: Skin color, texture,  turgor normal. No rashes or lesions ?Lymph nodes: Cervical, supraclavicular, and axillary nodes normal. ?No abnormal inguinal nodes palpated ?Neurologic: Grossly normal ? ? ?Pelvic: External genitalia:  no lesions ?             Urethra:  normal appearing urethra with no masses, tenderness or lesions ?             Bartholins and Skenes: normal    ?             Vagina: normal appearing vagina with normal color and no discharge, no lesions ?             Cervix: absent ?             Pap taken: No. ?Bimanual Exam:  Uterus:  uterus absent ?             Adnexa: no mass, fullness, tenderness ?              Rectovaginal: Confirms ?              Anus:  normal sphincter tone, no lesions ? ?Chaperone, Octaviano Batty, CMA, was present for exam. ? ?Assessment/Plan: ?1. Well woman exam with routine gynecological exam ?- pap smear neg with neg HR HPV 2021.  Will repeat next year. ?- MMG 12/2020.  Order placed for her to do in [place] which is close to her home. ?- colonoscopy 08/2021, follow up 10 years ?- tdap updated today ?- lab work ordered as per below ? ?2. Fibrocystic breast changes, unspecified laterality ? ?3. Anxiety state ?- on zoloft ? ?4. History of cervical dysplasia ? ?5. White coat syndrome without diagnosis of hypertension ? ?6. History of LAVH ? ?7. Blood tests for routine general physical examination ?- CBC; Future ?- Comprehensive metabolic panel; Future ?- Hemoglobin A1c; Future ?- Lipid panel; Future ?- TSH; Future ? ? ?

## 2022-01-29 ENCOUNTER — Other Ambulatory Visit: Payer: Self-pay | Admitting: Psychiatry

## 2022-01-29 DIAGNOSIS — F4001 Agoraphobia with panic disorder: Secondary | ICD-10-CM

## 2022-01-31 NOTE — Telephone Encounter (Signed)
Last filled 2/4 appt on 8/2

## 2022-03-23 ENCOUNTER — Emergency Department
Admission: RE | Admit: 2022-03-23 | Discharge: 2022-03-23 | Disposition: A | Discharge status: Home / Self Care | Payer: 59 | Source: Ambulatory Visit | Attending: Family Medicine | Admitting: Family Medicine

## 2022-03-23 VITALS — BP 126/89 | HR 86 | Temp 100.1°F | Resp 14 | Wt 158.0 lb

## 2022-03-23 DIAGNOSIS — J029 Acute pharyngitis, unspecified: Secondary | ICD-10-CM | POA: Diagnosis not present

## 2022-03-23 DIAGNOSIS — J039 Acute tonsillitis, unspecified: Secondary | ICD-10-CM

## 2022-03-23 LAB — POCT RAPID STREP A (OFFICE): Rapid Strep A Screen: NEGATIVE

## 2022-03-23 MED ORDER — AMOXICILLIN 875 MG PO TABS: 875.0000 mg | ORAL_TABLET | Freq: Two times a day (BID) | ORAL | 0 refills | Status: DC

## 2022-03-23 MED ORDER — FLUCONAZOLE 150 MG PO TABS: 150.0000 mg | ORAL_TABLET | Freq: Every day | ORAL | 0 refills | Status: DC

## 2022-03-23 NOTE — Discharge Instructions (Signed)
Take antibiotic 2 times a day Take Diflucan if needed Salt water gargles may help Call for problems  Your throat swab has been sent to the lab for culture.  You will be called if it is positive

## 2022-03-23 NOTE — ED Provider Notes (Signed)
[facility]    CSN: [id number] Arrival date & time: 03/23/22  1057      History   Chief Complaint Chief Complaint  Patient presents with   Sore Throat    White spots on throat , sore throat - Entered by patient    HPI Alexandra Welch is a 46 y.o. female.   HPI  Patient has a very painful sore throat.  Low-grade temperature.  She states that she is worsening after 3 to 4 days.  Her tonsils are quite large.  She noticed white patches on her tonsils. No headache body aches runny nose or cough No known exposure to illness Past Medical History:  Diagnosis Date   Anxiety    Asthma    Basal cell carcinoma of skin    Dr. Derrel Nip   Fibrocystic breast changes 08/27/2014   No hx breast biopsy. No FH first degree relative with breast or ovarian ca. Getting yearly mammograms per prior PCP recs.    GERD (gastroesophageal reflux disease)    PVC (premature ventricular contraction)    negative cardiac work up    Patient Active Problem List   Diagnosis Date Noted   History of cervical dysplasia 01/05/2022   White coat syndrome without diagnosis of hypertension 01/05/2022   History of LAVH 01/05/2022   Mild intermittent asthma 08/27/2014   Fibrocystic breast changes 08/27/2014   Anxiety state 11/29/2007    Past Surgical History:  Procedure Laterality Date   LAPAROSCOPIC HYSTERECTOMY  2007   for cervical dysplasia    OB History     Gravida  5   Para  2   Term      Preterm      AB  3   Living  2      SAB  3   IAB      Ectopic      Multiple      Live Births               Home Medications    Prior to Admission medications   Medication Sig Start Date End Date Taking? Authorizing Provider  amoxicillin (AMOXIL) 875 MG tablet Take 1 tablet (875 mg total) by mouth 2 (two) times daily. 03/23/22  Yes Raylene Everts, MD  fluconazole (DIFLUCAN) 150 MG tablet Take 1 tablet (150 mg total) by mouth daily. Repeat in 1 week if needed 03/23/22   Yes Raylene Everts, MD  acetaminophen (TYLENOL) 325 MG tablet Take 650 mg by mouth every 6 (six) hours as needed. Patient not taking: Reported on 03/23/2022    [provider]  albuterol (VENTOLIN HFA) 108 (90 Base) MCG/ACT inhaler Inhale 2 puffs into the lungs 2 (two) times daily as needed. 04/26/21   Billie Ruddy, MD  ALPRAZolam (XANAX) 0.5 MG tablet TAKE 1 TABLET (0.5 MG TOTAL) BY MOUTH 3 (THREE) TIMES DAILY AS NEEDED. FOR ANXIETY 01/31/22   Cottle, Billey Co., MD  Cholecalciferol (VITAMIN D PO) Take by mouth daily.    [provider]  loratadine (CLARITIN) 10 MG tablet Take 10 mg by mouth daily.    [provider]  sertraline (ZOLOFT) 100 MG tablet Take 2 tablets (200 mg total) by mouth daily. 10/11/21   Cottle, Billey Co., MD    Family History Family History  Problem Relation Age of Onset   Asthma Mother    Anxiety disorder Mother    Post-traumatic stress disorder Mother    Lung cancer Father  smoker and worked in [place] disorder Sister    Anxiety disorder Sister    Suicidality Brother        gambling died by suicide   Esophageal cancer Paternal Uncle    Obsessive Compulsive Disorder Son    Colon cancer Neg Hx    Colon polyps Neg Hx    Stomach cancer Neg Hx    Rectal cancer Neg Hx     Social History Social History   Tobacco Use   Smoking status: Never   Smokeless tobacco: Never  Vaping Use   Vaping Use: Never used  Substance Use Topics   Alcohol use: Not Currently   Drug use: No     Allergies   Doxycycline, Shellfish allergy, and Nsaids   Review of Systems Review of Systems See HPI  Physical Exam Triage Vital Signs ED Triage Vitals  Enc Vitals Group     BP 03/23/22 1111 126/89     Pulse Rate 03/23/22 1111 86     Resp 03/23/22 1111 14     Temp 03/23/22 1111 100.1 F (37.8 C)     Temp src --      SpO2 03/23/22 1111 100 %     Weight 03/23/22 1113 158 lb (71.7 kg)     Height --      Head Circumference  --      Peak Flow --      Pain Score 03/23/22 1110 6     Pain Loc --      Pain Edu? --      Excl. in [place]? --    No data found.  Updated Vital Signs BP 126/89 (BP Location: Right Arm)   Pulse 86   Temp 100.1 F (37.8 C)   Resp 14   Wt 71.7 kg   LMP 08/29/2005 (Approximate)   SpO2 100%   BMI 28.90 kg/m      Physical Exam Constitutional:      General: She is not in acute distress.    Appearance: She is well-developed.  HENT:     Head: Normocephalic and atraumatic.     Right Ear: Tympanic membrane and ear canal normal.     Left Ear: Tympanic membrane normal.     Nose: No congestion.     Mouth/Throat:     Mouth: Mucous membranes are moist.     Pharynx: Uvula midline. Pharyngeal swelling and posterior oropharyngeal erythema present.     Tonsils: Tonsillar exudate present. 4+ on the right. 4+ on the left.  Eyes:     Conjunctiva/sclera: Conjunctivae normal.     Pupils: Pupils are equal, round, and reactive to light.  Cardiovascular:     Rate and Rhythm: Normal rate.  Pulmonary:     Effort: Pulmonary effort is normal. No respiratory distress.  Abdominal:     General: There is no distension.     Palpations: Abdomen is soft.  Musculoskeletal:        General: Normal range of motion.     Cervical back: Normal range of motion.  Lymphadenopathy:     Cervical: Cervical adenopathy present.  Skin:    General: Skin is warm and dry.  Neurological:     Mental Status: She is alert.      UC Treatments / Results  Labs (all labs ordered are listed, but only abnormal results are displayed) Labs Reviewed  CULTURE, GROUP A STREP [facility])  POCT RAPID STREP A (OFFICE)    EKG   Radiology  No results found.  Procedures Procedures (including critical care time)  Medications Ordered in UC Medications - No data to display  Initial Impression / Assessment and Plan / UC Course  I have reviewed the triage vital signs and the nursing notes.  Pertinent labs & imaging results that  were available during my care of the patient were reviewed by me and considered in my medical decision making (see chart for details).     Rapid strep is negative.  Patient has an acute tonsillitis.  We will treat with antibiotics pending culture report. Final Clinical Impressions(s) / UC Diagnoses   Final diagnoses:  Acute sore throat  Tonsillitis     Discharge Instructions      Take antibiotic 2 times a day Take Diflucan if needed Salt water gargles may help Call for problems  Your throat swab has been sent to the lab for culture.  You will be called if it is positive   ED Prescriptions     Medication Sig Dispense Auth. Provider   amoxicillin (AMOXIL) 875 MG tablet Take 1 tablet (875 mg total) by mouth 2 (two) times daily. 14 tablet Raylene Everts, MD   fluconazole (DIFLUCAN) 150 MG tablet Take 1 tablet (150 mg total) by mouth daily. Repeat in 1 week if needed 2 tablet Raylene Everts, MD      PDMP not reviewed this encounter.   Raylene Everts, MD 03/23/22 [phone]

## 2022-03-23 NOTE — ED Triage Notes (Addendum)
Complaints of sore throat started 3 days ago, denies other symptoms. Tylenol for pain - last dose 0730

## 2022-03-26 LAB — CULTURE, GROUP A STREP (THRC)

## 2022-03-30 ENCOUNTER — Ambulatory Visit: Payer: 59 | Admitting: Psychiatry

## 2022-04-12 ENCOUNTER — Ambulatory Visit: Payer: 59 | Admitting: Psychiatry

## 2022-04-24 DIAGNOSIS — J45901 Unspecified asthma with (acute) exacerbation: Secondary | ICD-10-CM | POA: Diagnosis not present

## 2022-04-25 DIAGNOSIS — R0789 Other chest pain: Secondary | ICD-10-CM | POA: Diagnosis not present

## 2022-04-25 DIAGNOSIS — R079 Chest pain, unspecified: Secondary | ICD-10-CM | POA: Diagnosis not present

## 2022-05-02 ENCOUNTER — Ambulatory Visit
Admission: EM | Admit: 2022-05-02 | Discharge: 2022-05-02 | Disposition: A | Discharge status: Home / Self Care | Payer: 59 | Attending: Family Medicine | Admitting: Family Medicine

## 2022-05-02 ENCOUNTER — Encounter: Payer: Self-pay | Admitting: Emergency Medicine

## 2022-05-02 DIAGNOSIS — J309 Allergic rhinitis, unspecified: Secondary | ICD-10-CM | POA: Diagnosis not present

## 2022-05-02 DIAGNOSIS — R059 Cough, unspecified: Secondary | ICD-10-CM

## 2022-05-02 MED ORDER — AZITHROMYCIN 250 MG PO TABS: 250.0000 mg | ORAL_TABLET | Freq: Every day | ORAL | 0 refills | Status: DC

## 2022-05-02 MED ORDER — PREDNISONE 20 MG PO TABS: ORAL_TABLET | ORAL | 0 refills | Status: DC

## 2022-05-02 MED ORDER — FEXOFENADINE HCL 180 MG PO TABS: 180.0000 mg | ORAL_TABLET | Freq: Every day | ORAL | 0 refills | Status: DC

## 2022-05-02 NOTE — Discharge Instructions (Addendum)
Instucted patient to discontinue Claritin now advised patient to take medication as directed with food to completion.  Advised patient to take prednisone and Allegra with Zithromax for the next 5 days.  Advised patient may use Allegra as needed afterwards for concurrent postnasal drainage/drip.  Encouraged patient to increase daily water intake while taking these medications.  Advised if symptoms worsen and/or unresolved please follow-up with PCP or here for further evaluation.

## 2022-05-02 NOTE — ED Triage Notes (Signed)
Hx of asthma  Worse this summer  [facility] and had 2  duo neb treatments & a steroid shot  No problems until 0730 this am  Albuterol treatments at home x 2 today  On Amoxicillin for a UTI

## 2022-05-02 NOTE — ED Provider Notes (Signed)
[facility]    CSN: [id number] Arrival date & time: 05/02/22  1403      History   Chief Complaint Chief Complaint  Patient presents with   Wheezing    HPI Alexandra Welch is a 46 y.o. female.   HPI 46 year old female presents with cough and chest tightness for several days.  Reports history of mild intermittent asthma patient reports [facility] and had 2 DuoNeb treatments.  PMH significant for mild intermittent asthma, history of cervical dysplasia and fibrocystic breast changes.  Patient reports clear CXR performed with PCP on 04/25/2022.  Past Medical History:  Diagnosis Date   Anxiety    Asthma    Basal cell carcinoma of skin    Dr. Derrel Nip   Fibrocystic breast changes 08/27/2014   No hx breast biopsy. No FH first degree relative with breast or ovarian ca. Getting yearly mammograms per prior PCP recs.    GERD (gastroesophageal reflux disease)    PVC (premature ventricular contraction)    negative cardiac work up    Patient Active Problem List   Diagnosis Date Noted   History of cervical dysplasia 01/05/2022   White coat syndrome without diagnosis of hypertension 01/05/2022   History of LAVH 01/05/2022   Mild intermittent asthma 08/27/2014   Fibrocystic breast changes 08/27/2014   Anxiety state 11/29/2007    Past Surgical History:  Procedure Laterality Date   LAPAROSCOPIC HYSTERECTOMY  2007   for cervical dysplasia    OB History     Gravida  5   Para  2   Term      Preterm      AB  3   Living  2      SAB  3   IAB      Ectopic      Multiple      Live Births               Home Medications    Prior to Admission medications   Medication Sig Start Date End Date Taking? Authorizing Provider  albuterol (VENTOLIN HFA) 108 (90 Base) MCG/ACT inhaler Inhale 2 puffs into the lungs 2 (two) times daily as needed. 04/26/21  Yes Billie Ruddy, MD  azithromycin (ZITHROMAX) 250 MG tablet Take 1 tablet (250  mg total) by mouth daily. Take first 2 tablets together, then 1 every day until finished. 05/02/22  Yes Eliezer Lofts, FNP  fexofenadine [facility] ALLERGY) 180 MG tablet Take 1 tablet (180 mg total) by mouth daily for 15 days. 05/02/22 05/17/22 Yes Eliezer Lofts, FNP  predniSONE (DELTASONE) 20 MG tablet Take 3 tabs PO daily x 5 days. 05/02/22  Yes Eliezer Lofts, FNP  acetaminophen (TYLENOL) 325 MG tablet Take 650 mg by mouth every 6 (six) hours as needed. Patient not taking: Reported on 03/23/2022    [provider]  ALPRAZolam (XANAX) 0.5 MG tablet TAKE 1 TABLET (0.5 MG TOTAL) BY MOUTH 3 (THREE) TIMES DAILY AS NEEDED. FOR ANXIETY 01/31/22   Cottle, Billey Co., MD  amoxicillin (AMOXIL) 875 MG tablet Take 1 tablet (875 mg total) by mouth 2 (two) times daily. 03/23/22   Raylene Everts, MD  Cholecalciferol (VITAMIN D PO) Take by mouth daily.    [provider]  fluconazole (DIFLUCAN) 150 MG tablet Take 1 tablet (150 mg total) by mouth daily. Repeat in 1 week if needed 03/23/22   Raylene Everts, MD  loratadine (CLARITIN) 10 MG tablet Take  10 mg by mouth daily.    [provider]  sertraline (ZOLOFT) 100 MG tablet Take 2 tablets (200 mg total) by mouth daily. 10/11/21   Cottle, Billey Co., MD    Family History Family History  Problem Relation Age of Onset   Asthma Mother    Anxiety disorder Mother    Post-traumatic stress disorder Mother    Lung cancer Father        smoker and worked in [place] disorder Sister    Anxiety disorder Sister    Suicidality Brother        gambling died by suicide   Esophageal cancer Paternal Uncle    Obsessive Compulsive Disorder Son    Colon cancer Neg Hx    Colon polyps Neg Hx    Stomach cancer Neg Hx    Rectal cancer Neg Hx     Social History Social History   Tobacco Use   Smoking status: Never   Smokeless tobacco: Never  Vaping Use   Vaping Use: Never used  Substance Use Topics   Alcohol use: Not Currently    Drug use: No     Allergies   Doxycycline, Shellfish allergy, and Nsaids   Review of Systems Review of Systems  HENT:  Positive for congestion.   Respiratory:  Positive for cough and shortness of breath.      Physical Exam Triage Vital Signs ED Triage Vitals  Enc Vitals Group     BP 05/02/22 1445 119/88     Pulse Rate 05/02/22 1445 98     Resp 05/02/22 1445 16     Temp 05/02/22 1445 99.8 F (37.7 C)     Temp Source 05/02/22 1445 Oral     SpO2 05/02/22 1445 98 %     Weight 05/02/22 1446 155 lb (70.3 kg)     Height 05/02/22 1446 '5\' 2"'$  (1.575 m)     Head Circumference --      Peak Flow --      Pain Score 05/02/22 1446 0     Pain Loc --      Pain Edu? --      Excl. in [place]? --    No data found.  Updated Vital Signs BP 119/88 (BP Location: Left Arm)   Pulse 98   Temp 99.8 F (37.7 C) (Oral)   Resp 16   Ht '5\' 2"'$  (1.575 m)   Wt 155 lb (70.3 kg)   LMP 08/29/2005 (Approximate)   SpO2 98%   BMI 28.35 kg/m    Physical Exam Vitals and nursing note reviewed.  Constitutional:      Appearance: Normal appearance.  HENT:     Head: Normocephalic and atraumatic.     Right Ear: Tympanic membrane, ear canal and external ear normal.     Left Ear: Tympanic membrane, ear canal and external ear normal.     Nose: No rhinorrhea.     Comments: Turbinates are erythematous/edematous    Mouth/Throat:     Mouth: Mucous membranes are moist.     Pharynx: Oropharynx is clear.     Comments: Moderate to significant clear drainage of posterior oropharynx noted Eyes:     Extraocular Movements: Extraocular movements intact.     Conjunctiva/sclera: Conjunctivae normal.     Pupils: Pupils are equal, round, and reactive to light.  Cardiovascular:     Rate and Rhythm: Normal rate and regular rhythm.     Pulses: Normal pulses.  Heart sounds: Normal heart sounds.  Pulmonary:     Effort: Pulmonary effort is normal.     Breath sounds: Normal breath sounds. No rhonchi.     Comments:  Infrequent nonproductive cough noted Musculoskeletal:        General: Normal range of motion.     Cervical back: Normal range of motion and neck supple.  Skin:    General: Skin is warm and dry.  Neurological:     General: No focal deficit present.     Mental Status: She is alert and oriented to person, place, and time. Mental status is at baseline.      UC Treatments / Results  Labs (all labs ordered are listed, but only abnormal results are displayed) Labs Reviewed - No data to display  EKG   Radiology No results found.  Procedures Procedures (including critical care time)  Medications Ordered in UC Medications - No data to display  Initial Impression / Assessment and Plan / UC Course  I have reviewed the triage vital signs and the nursing notes.  Pertinent labs & imaging results that were available during my care of the patient were reviewed by me and considered in my medical decision making (see chart for details).     MDM: 1.  Cough-Rx'd Zithromax, prednisone; 2.  Allergic rhinitis-Rx'd Allegra. Instucted patient to discontinue Claritin now advised patient to take medication as directed with food to completion.  Advised patient to take prednisone and Allegra with Zithromax for the next 5 days.  Advised patient may use Allegra as needed afterwards for concurrent postnasal drainage/drip.  Encouraged patient to increase daily water intake while taking these medications.  Advised if symptoms worsen and/or unresolved please follow-up with PCP or here for further evaluation.  Patient discharged home, hemodynamically stable. Final Clinical Impressions(s) / UC Diagnoses   Final diagnoses:  Cough, unspecified type  Allergic rhinitis, unspecified seasonality, unspecified trigger     Discharge Instructions      Instucted patient to discontinue Claritin now advised patient to take medication as directed with food to completion.  Advised patient to take prednisone and Allegra  with Zithromax for the next 5 days.  Advised patient may use Allegra as needed afterwards for concurrent postnasal drainage/drip.  Encouraged patient to increase daily water intake while taking these medications.  Advised if symptoms worsen and/or unresolved please follow-up with PCP or here for further evaluation.     ED Prescriptions     Medication Sig Dispense Auth. Provider   azithromycin (ZITHROMAX) 250 MG tablet Take 1 tablet (250 mg total) by mouth daily. Take first 2 tablets together, then 1 every day until finished. 6 tablet Eliezer Lofts, FNP   predniSONE (DELTASONE) 20 MG tablet Take 3 tabs PO daily x 5 days. 15 tablet Eliezer Lofts, FNP   fexofenadine [facility] ALLERGY) 180 MG tablet Take 1 tablet (180 mg total) by mouth daily for 15 days. 15 tablet Eliezer Lofts, FNP      PDMP not reviewed this encounter.   Eliezer Lofts, FNP 05/02/22 1520

## 2022-05-02 NOTE — ED Notes (Signed)
O2 sat checked in lobby - 97% w/ HR of 106

## 2022-06-16 ENCOUNTER — Ambulatory Visit (INDEPENDENT_AMBULATORY_CARE_PROVIDER_SITE_OTHER): Payer: Self-pay | Admitting: Psychiatry

## 2022-06-16 DIAGNOSIS — Z91199 Patient's noncompliance with other medical treatment and regimen due to unspecified reason: Secondary | ICD-10-CM

## 2022-06-16 NOTE — Progress Notes (Signed)
Canceled 2 appointments in August and no-showed today.  Needs to keep appointment in order to continue refills.

## 2022-06-18 ENCOUNTER — Ambulatory Visit
Admission: RE | Admit: 2022-06-18 | Discharge: 2022-06-18 | Disposition: A | Discharge status: Home / Self Care | Payer: 59 | Source: Ambulatory Visit | Attending: Family Medicine | Admitting: Family Medicine

## 2022-06-18 VITALS — BP 134/93 | HR 1 | Temp 99.2°F | Resp 16

## 2022-06-18 DIAGNOSIS — J309 Allergic rhinitis, unspecified: Secondary | ICD-10-CM

## 2022-06-18 DIAGNOSIS — R059 Cough, unspecified: Secondary | ICD-10-CM | POA: Diagnosis not present

## 2022-06-18 MED ORDER — BENZONATATE 200 MG PO CAPS: 200.0000 mg | ORAL_CAPSULE | Freq: Three times a day (TID) | ORAL | 0 refills | Status: AC | PRN

## 2022-06-18 MED ORDER — FEXOFENADINE HCL 180 MG PO TABS: 180.0000 mg | ORAL_TABLET | Freq: Every day | ORAL | 0 refills | Status: DC

## 2022-06-18 MED ORDER — AMOXICILLIN 875 MG PO TABS: 875.0000 mg | ORAL_TABLET | Freq: Two times a day (BID) | ORAL | 0 refills | Status: AC

## 2022-06-18 MED ORDER — PROMETHAZINE-DM 6.25-15 MG/5ML PO SYRP: 5.0000 mL | ORAL_SOLUTION | Freq: Two times a day (BID) | ORAL | 0 refills | Status: DC | PRN

## 2022-06-18 NOTE — Discharge Instructions (Addendum)
Advised patient to take medication as directed with food to completion.  Advised patient to take Allegra with first dose of amoxicillin for the next 5 of 7 days.  Advised may use Tessalon Perles daily or as needed for cough.  Advised may use Promethazine DM prior to sleep due to sedative effects for cough.  Encouraged patient increase daily water intake while taking these medications.  Advised if symptoms worsen and/or unresolved please follow-up with PCP or here for further evaluation.

## 2022-06-18 NOTE — ED Triage Notes (Signed)
Pt c/o cough and congestion x 7-8 days. Taking dayquil and myquil prn. Hx of bronchitis. Allegra daily.

## 2022-06-18 NOTE — ED Provider Notes (Signed)
[facility]    CSN: [id number] Arrival date & time: 06/18/22  1254      History   Chief Complaint Chief Complaint  Patient presents with   Cough    Cough with green phlegm - Entered by patient   Nasal Congestion    HPI Alexandra Welch is a 46 y.o. female.   HPI Pleasant 46 year old female presents with cough and congestion for 7-8 days.  Reports history of bronchitis.  PMH significant for fibrocystic breast changes, mild intermittent asthma and anxiety.  Past Medical History:  Diagnosis Date   Anxiety    Asthma    Basal cell carcinoma of skin    Dr. Derrel Nip   Fibrocystic breast changes 08/27/2014   No hx breast biopsy. No FH first degree relative with breast or ovarian ca. Getting yearly mammograms per prior PCP recs.    GERD (gastroesophageal reflux disease)    PVC (premature ventricular contraction)    negative cardiac work up    Patient Active Problem List   Diagnosis Date Noted   History of cervical dysplasia 01/05/2022   White coat syndrome without diagnosis of hypertension 01/05/2022   History of LAVH 01/05/2022   Mild intermittent asthma 08/27/2014   Fibrocystic breast changes 08/27/2014   Anxiety state 11/29/2007    Past Surgical History:  Procedure Laterality Date   LAPAROSCOPIC HYSTERECTOMY  2007   for cervical dysplasia    OB History     Gravida  5   Para  2   Term      Preterm      AB  3   Living  2      SAB  3   IAB      Ectopic      Multiple      Live Births               Home Medications    Prior to Admission medications   Medication Sig Start Date End Date Taking? Authorizing Provider  amoxicillin (AMOXIL) 875 MG tablet Take 1 tablet (875 mg total) by mouth 2 (two) times daily for 7 days. 06/18/22 06/25/22 Yes Eliezer Lofts, FNP  benzonatate (TESSALON) 200 MG capsule Take 1 capsule (200 mg total) by mouth 3 (three) times daily as needed for up to 7 days. 06/18/22 06/25/22 Yes Eliezer Lofts, FNP   fexofenadine [facility] ALLERGY) 180 MG tablet Take 1 tablet (180 mg total) by mouth daily. 06/18/22 07/18/22 Yes Eliezer Lofts, FNP  promethazine-dextromethorphan (PROMETHAZINE-DM) 6.25-15 MG/5ML syrup Take 5 mLs by mouth 2 (two) times daily as needed for cough. 06/18/22  Yes Eliezer Lofts, FNP  albuterol (VENTOLIN HFA) 108 (90 Base) MCG/ACT inhaler Inhale 2 puffs into the lungs 2 (two) times daily as needed. 04/26/21   Billie Ruddy, MD  ALPRAZolam (XANAX) 0.5 MG tablet TAKE 1 TABLET (0.5 MG TOTAL) BY MOUTH 3 (THREE) TIMES DAILY AS NEEDED. FOR ANXIETY 01/31/22   Cottle, Billey Co., MD  Cholecalciferol (VITAMIN D PO) Take by mouth daily.    [provider]  fluconazole (DIFLUCAN) 150 MG tablet Take 1 tablet (150 mg total) by mouth daily. Repeat in 1 week if needed 03/23/22   Raylene Everts, MD  loratadine (CLARITIN) 10 MG tablet Take 10 mg by mouth daily.    [provider]  sertraline (ZOLOFT) 100 MG tablet Take 2 tablets (200 mg total) by mouth daily. 10/11/21   Cottle, Billey Co., MD    Family History Family History  Problem Relation Age of Onset   Asthma Mother    Anxiety disorder Mother    Post-traumatic stress disorder Mother    Lung cancer Father        smoker and worked in [place] disorder Sister    Anxiety disorder Sister    Suicidality Brother        gambling died by suicide   Esophageal cancer Paternal Uncle    Obsessive Compulsive Disorder Son    Colon cancer Neg Hx    Colon polyps Neg Hx    Stomach cancer Neg Hx    Rectal cancer Neg Hx     Social History Social History   Tobacco Use   Smoking status: Never   Smokeless tobacco: Never  Vaping Use   Vaping Use: Never used  Substance Use Topics   Alcohol use: Not Currently   Drug use: No     Allergies   Doxycycline, Shellfish allergy, and Nsaids   Review of Systems Review of Systems  HENT:  Positive for congestion and postnasal drip.   Respiratory:  Positive for cough.    All other systems reviewed and are negative.    Physical Exam Triage Vital Signs ED Triage Vitals  Enc Vitals Group     BP 06/18/22 1320 (!) 134/93     Pulse Rate 06/18/22 1320 (!) 1     Resp 06/18/22 1320 16     Temp 06/18/22 1320 99.2 F (37.3 C)     Temp Source 06/18/22 1320 Oral     SpO2 06/18/22 1320 99 %     Weight --      Height --      Head Circumference --      Peak Flow --      Pain Score 06/18/22 1321 0     Pain Loc --      Pain Edu? --      Excl. in [place]? --    No data found.  Updated Vital Signs BP (!) 134/93 (BP Location: Right Arm)   Pulse (!) 1   Temp 99.2 F (37.3 C) (Oral)   Resp 16   LMP 08/29/2005 (Approximate)   SpO2 99%      Physical Exam Vitals and nursing note reviewed.  Constitutional:      General: She is not in acute distress.    Appearance: Normal appearance. She is obese. She is ill-appearing.  HENT:     Head: Normocephalic and atraumatic.     Right Ear: Tympanic membrane, ear canal and external ear normal.     Left Ear: Tympanic membrane, ear canal and external ear normal.     Mouth/Throat:     Mouth: Mucous membranes are moist.     Pharynx: Oropharynx is clear.     Comments: Significant amount of clear drainage of posterior oropharynx noted Eyes:     Extraocular Movements: Extraocular movements intact.     Conjunctiva/sclera: Conjunctivae normal.     Pupils: Pupils are equal, round, and reactive to light.  Cardiovascular:     Rate and Rhythm: Normal rate and regular rhythm.     Pulses: Normal pulses.     Heart sounds: Normal heart sounds.  Pulmonary:     Effort: Pulmonary effort is normal.     Breath sounds: Normal breath sounds. No wheezing or rhonchi.     Comments: Frequent nonproductive cough noted on exam Musculoskeletal:        General: Normal range of motion.  Cervical back: Normal range of motion and neck supple. No tenderness.  Lymphadenopathy:     Cervical: No cervical adenopathy.  Skin:    General: Skin is  warm and dry.  Neurological:     General: No focal deficit present.     Mental Status: She is alert and oriented to person, place, and time.      UC Treatments / Results  Labs (all labs ordered are listed, but only abnormal results are displayed) Labs Reviewed - No data to display  EKG   Radiology No results found.  Procedures Procedures (including critical care time)  Medications Ordered in UC Medications - No data to display  Initial Impression / Assessment and Plan / UC Course  I have reviewed the triage vital signs and the nursing notes.  Pertinent labs & imaging results that were available during my care of the patient were reviewed by me and considered in my medical decision making (see chart for details).     MDM: 1.  Cough unspecified type-Rx'd amoxicillin, Tessalon, Promethazine DM; 2.  Allergic rhinitis-Rx'd Allegra. Advised patient to take medication as directed with food to completion.  Advised patient to take Allegra with first dose of amoxicillin for the next 5 of 7 days.  Advised may use Tessalon [place] daily or as needed for cough.  Advised may use Promethazine DM prior to sleep due to sedative effects for cough.  Encouraged patient increase daily water intake while taking these medications.  Advised if symptoms worsen and/or unresolved please follow-up with PCP or here for further evaluation.  Patient discharged home, hemodynamically stable. Final Clinical Impressions(s) / UC Diagnoses   Final diagnoses:  Cough, unspecified type  Allergic rhinitis, unspecified seasonality, unspecified trigger     Discharge Instructions      Advised patient to take medication as directed with food to completion.  Advised patient to take Allegra with first dose of amoxicillin for the next 5 of 7 days.  Advised may use Tessalon [place] daily or as needed for cough.  Advised may use Promethazine DM prior to sleep due to sedative effects for cough.  Encouraged patient increase  daily water intake while taking these medications.  Advised if symptoms worsen and/or unresolved please follow-up with PCP or here for further evaluation.     ED Prescriptions     Medication Sig Dispense Auth. Provider   amoxicillin (AMOXIL) 875 MG tablet Take 1 tablet (875 mg total) by mouth 2 (two) times daily for 7 days. 14 tablet Eliezer Lofts, FNP   benzonatate (TESSALON) 200 MG capsule Take 1 capsule (200 mg total) by mouth 3 (three) times daily as needed for up to 7 days. 40 capsule Eliezer Lofts, FNP   promethazine-dextromethorphan (PROMETHAZINE-DM) 6.25-15 MG/5ML syrup Take 5 mLs by mouth 2 (two) times daily as needed for cough. 118 mL Eliezer Lofts, FNP   fexofenadine [facility] ALLERGY) 180 MG tablet Take 1 tablet (180 mg total) by mouth daily. 30 tablet Eliezer Lofts, FNP      PDMP not reviewed this encounter.   Eliezer Lofts, [place] 06/18/22 1357

## 2022-06-19 ENCOUNTER — Telehealth: Payer: Self-pay

## 2022-06-19 NOTE — Telephone Encounter (Signed)
TC to f/u with pt after yesterday's visit to [facility]. Pt states she is feeling okay and has no problems or questions to address at this time.

## 2022-06-27 ENCOUNTER — Telehealth: Payer: Self-pay

## 2022-06-27 NOTE — Telephone Encounter (Signed)
Last 2 visits were VV last being 03/19/21. Pt has not been in office since 08/14/18 No upcoming appts noted.  LVM for pt to call back to schedule CPE asap.

## 2022-07-07 DIAGNOSIS — D2262 Melanocytic nevi of left upper limb, including shoulder: Secondary | ICD-10-CM | POA: Diagnosis not present

## 2022-07-07 DIAGNOSIS — L720 Epidermal cyst: Secondary | ICD-10-CM | POA: Diagnosis not present

## 2022-07-07 DIAGNOSIS — L814 Other melanin hyperpigmentation: Secondary | ICD-10-CM | POA: Diagnosis not present

## 2022-07-07 DIAGNOSIS — D225 Melanocytic nevi of trunk: Secondary | ICD-10-CM | POA: Diagnosis not present

## 2022-07-07 DIAGNOSIS — L570 Actinic keratosis: Secondary | ICD-10-CM | POA: Diagnosis not present

## 2022-07-07 DIAGNOSIS — Z85828 Personal history of other malignant neoplasm of skin: Secondary | ICD-10-CM | POA: Diagnosis not present

## 2022-07-07 DIAGNOSIS — L738 Other specified follicular disorders: Secondary | ICD-10-CM | POA: Diagnosis not present

## 2022-07-07 DIAGNOSIS — D2272 Melanocytic nevi of left lower limb, including hip: Secondary | ICD-10-CM | POA: Diagnosis not present

## 2022-07-07 DIAGNOSIS — D485 Neoplasm of uncertain behavior of skin: Secondary | ICD-10-CM | POA: Diagnosis not present

## 2022-07-07 DIAGNOSIS — D2261 Melanocytic nevi of right upper limb, including shoulder: Secondary | ICD-10-CM | POA: Diagnosis not present

## 2022-07-07 DIAGNOSIS — L821 Other seborrheic keratosis: Secondary | ICD-10-CM | POA: Diagnosis not present

## 2022-07-07 DIAGNOSIS — L918 Other hypertrophic disorders of the skin: Secondary | ICD-10-CM | POA: Diagnosis not present

## 2022-07-13 ENCOUNTER — Telehealth: Payer: Self-pay | Admitting: Family Medicine

## 2022-07-13 NOTE — Telephone Encounter (Signed)
Spoke with pt, informed we have no records, states she grew up in [place]. [place]., and she has gotten her records but has questions. Scheduled appointment for in office 07/14/22.

## 2022-07-13 NOTE — Telephone Encounter (Signed)
Pt called to ask if she needs any of the following shots and can she get them here ??  1- MMR 2- Hep B 3- Varicella   Please advise.

## 2022-07-14 ENCOUNTER — Ambulatory Visit: Payer: 59 | Admitting: Family Medicine

## 2022-08-26 DIAGNOSIS — H40013 Open angle with borderline findings, low risk, bilateral: Secondary | ICD-10-CM | POA: Diagnosis not present

## 2022-09-23 ENCOUNTER — Ambulatory Visit
Admission: RE | Admit: 2022-09-23 | Discharge: 2022-09-23 | Disposition: A | Discharge status: Home / Self Care | Payer: 59 | Source: Ambulatory Visit | Attending: Urgent Care | Admitting: Urgent Care

## 2022-09-23 VITALS — BP 145/98 | HR 85 | Temp 98.2°F | Resp 16

## 2022-09-23 DIAGNOSIS — Z7251 High risk heterosexual behavior: Secondary | ICD-10-CM | POA: Diagnosis not present

## 2022-09-23 DIAGNOSIS — R070 Pain in throat: Secondary | ICD-10-CM

## 2022-09-23 LAB — POCT RAPID STREP A (OFFICE): Rapid Strep A Screen: NEGATIVE

## 2022-09-23 NOTE — ED Provider Notes (Signed)
[place]  Note:  This document was prepared using Dragon voice recognition software and may include unintentional dictation errors.  MRN: [id number] DOB: 1976/04/17  Subjective:   Alexandra Welch is a 47 y.o. female presenting for STI testing.  Patient has concerns that her ex-husband was having sex with other partners.  She recently had sex that was unprotected with him.  This was both vaginal and oral.  Reports that thereafter she had a rash over the arms and legs that ultimately resolved.  She also had a bizarre episode of throat pain that lasted a few days.  She no longer has had throat pain.  Wants to make sure that she is clear of any type of infection.  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  No current facility-administered medications for this encounter.  Current Outpatient Medications:    albuterol (VENTOLIN HFA) 108 (90 Base) MCG/ACT inhaler, Inhale 2 puffs into the lungs 2 (two) times daily as needed., Disp: 3.7 g, Rfl: 1   ALPRAZolam (XANAX) 0.5 MG tablet, TAKE 1 TABLET (0.5 MG TOTAL) BY MOUTH 3 (THREE) TIMES DAILY AS NEEDED. FOR ANXIETY, Disp: 60 tablet, Rfl: 1   Cholecalciferol (VITAMIN D PO), Take by mouth daily., Disp: , Rfl:    fexofenadine (ALLEGRA ALLERGY) 180 MG tablet, Take 1 tablet (180 mg total) by mouth daily., Disp: 30 tablet, Rfl: 0   fluconazole (DIFLUCAN) 150 MG tablet, Take 1 tablet (150 mg total) by mouth daily. Repeat in 1 week if needed, Disp: 2 tablet, Rfl: 0   loratadine (CLARITIN) 10 MG tablet, Take 10 mg by mouth daily., Disp: , Rfl:    promethazine-dextromethorphan (PROMETHAZINE-DM) 6.25-15 MG/5ML syrup, Take 5 mLs by mouth 2 (two) times daily as needed for cough., Disp: 118 mL, Rfl: 0   sertraline (ZOLOFT) 100 MG tablet, Take 2 tablets (200 mg total) by mouth daily., Disp: 180 tablet, Rfl: 1   Allergies  Allergen Reactions   Doxycycline Swelling and Rash    Mild upper lip swelling, facial  rash.    Shellfish Allergy Swelling   Nsaids Other (See Comments)    All have made her tongue itch    Past Medical History:  Diagnosis Date   Anxiety    Asthma    Basal cell carcinoma of skin    Dr. Derrel Nip   Fibrocystic breast changes 08/27/2014   No hx breast biopsy. No FH first degree relative with breast or ovarian ca. Getting yearly mammograms per prior PCP recs.    GERD (gastroesophageal reflux disease)    PVC (premature ventricular contraction)    negative cardiac work up     Past Surgical History:  Procedure Laterality Date   LAPAROSCOPIC HYSTERECTOMY  2007   for cervical dysplasia    Family History  Problem Relation Age of Onset   Asthma Mother    Anxiety disorder Mother    Post-traumatic stress disorder Mother    Lung cancer Father        smoker and worked in [place] disorder Sister    Anxiety disorder Sister    Suicidality Brother        gambling died by suicide   Esophageal cancer Paternal Uncle    Obsessive Compulsive Disorder Son    Colon cancer Neg Hx    Colon polyps Neg Hx    Stomach cancer Neg Hx    Rectal cancer Neg Hx  Social History   Tobacco Use   Smoking status: Never   Smokeless tobacco: Never  Vaping Use   Vaping Use: Never used  Substance Use Topics   Alcohol use: Not Currently   Drug use: No   ROS   Objective:   Vitals: BP (!) 145/98 (BP Location: Right Arm)   Pulse 85   Temp 98.2 F (36.8 C) (Oral)   Resp 16   LMP 08/29/2005 (Approximate)   SpO2 100%   Physical Exam Constitutional:      General: She is not in acute distress.    Appearance: Normal appearance. She is well-developed. She is not ill-appearing, toxic-appearing or diaphoretic.  HENT:     Head: Normocephalic and atraumatic.     Nose: Nose normal.     Mouth/Throat:     Mouth: Mucous membranes are moist.     Pharynx: No pharyngeal swelling, oropharyngeal exudate, posterior oropharyngeal erythema or uvula swelling.     Tonsils: No  tonsillar exudate or tonsillar abscesses. 0 on the right. 0 on the left.  Eyes:     General: No scleral icterus.       Right eye: No discharge.        Left eye: No discharge.     Extraocular Movements: Extraocular movements intact.  Cardiovascular:     Rate and Rhythm: Normal rate.  Pulmonary:     Effort: Pulmonary effort is normal.  Skin:    General: Skin is warm and dry.     Findings: Rash (resolving solitary lesions all less than 1/2cm over upper chest) present.  Neurological:     General: No focal deficit present.     Mental Status: She is alert and oriented to person, place, and time.  Psychiatric:        Mood and Affect: Mood normal.        Behavior: Behavior normal.     Results for orders placed or performed during the hospital encounter of 09/23/22 (from the past 24 hour(s))  POCT rapid strep A     Status: None   Collection Time: 09/23/22  1:58 PM  Result Value Ref Range   Rapid Strep A Screen Negative Negative    Assessment and Plan :   PDMP not reviewed this encounter.  1. Unprotected sex   2. Throat pain     STI check pending, strep culture pending.  Recommended monitoring for the lesions over the chest.  Suspect that patient may have had a dermatitis, folliculitis that is resolving without much intervention.  Will treat as appropriate based off of lab results. Counseled patient on potential for adverse effects with medications prescribed/recommended today, ER and return-to-clinic precautions discussed, patient verbalized understanding.    Jaynee Eagles, PA-C 09/23/22 1438

## 2022-09-23 NOTE — ED Triage Notes (Addendum)
Patient states her husband cheated on her. States she was concerned because she had a very unusual sore throat. Denies any vaginal symptoms. Also states she had RSV 3 weeks ago and is still recovering from that. States her husband hasn't reported symptoms or is being treated for anything that she's aware of. Was initially concerned due a rash that developed alongside the sore throat, but it has improved

## 2022-09-24 ENCOUNTER — Telehealth: Payer: Self-pay

## 2022-09-24 LAB — RPR: RPR Ser Ql: NONREACTIVE

## 2022-09-24 LAB — HIV ANTIBODY (ROUTINE TESTING W REFLEX): HIV Screen 4th Generation wRfx: NONREACTIVE

## 2022-09-24 NOTE — Telephone Encounter (Signed)
TC to f/u with pt after yesterday's visit to [facility].Pt reports she is doing okay and has no problems or questions to address at this time.

## 2022-09-26 LAB — CYTOLOGY, (ORAL, ANAL, URETHRAL) ANCILLARY ONLY
Chlamydia: NEGATIVE
Comment: NEGATIVE
Comment: NEGATIVE
Comment: NORMAL
Neisseria Gonorrhea: NEGATIVE
Trichomonas: NEGATIVE

## 2022-09-26 LAB — CULTURE, GROUP A STREP (THRC)

## 2022-09-26 LAB — CERVICOVAGINAL ANCILLARY ONLY
Chlamydia: NEGATIVE
Comment: NEGATIVE
Comment: NEGATIVE
Comment: NORMAL
Neisseria Gonorrhea: NEGATIVE
Trichomonas: NEGATIVE

## 2023-01-16 ENCOUNTER — Ambulatory Visit (HOSPITAL_BASED_OUTPATIENT_CLINIC_OR_DEPARTMENT_OTHER): Payer: 59 | Admitting: Obstetrics & Gynecology

## 2023-03-20 ENCOUNTER — Ambulatory Visit
Admission: RE | Admit: 2023-03-20 | Discharge: 2023-03-20 | Disposition: A | Discharge status: Home / Self Care | Payer: BC Managed Care – PPO | Source: Ambulatory Visit | Attending: Family Medicine | Admitting: Family Medicine

## 2023-03-20 VITALS — BP 135/85 | HR 98 | Temp 98.7°F | Resp 16

## 2023-03-20 DIAGNOSIS — J0100 Acute maxillary sinusitis, unspecified: Secondary | ICD-10-CM

## 2023-03-20 DIAGNOSIS — J3489 Other specified disorders of nose and nasal sinuses: Secondary | ICD-10-CM

## 2023-03-20 MED ORDER — PREDNISONE 10 MG (21) PO TBPK: ORAL_TABLET | Freq: Every day | ORAL | 0 refills | Status: DC

## 2023-03-20 MED ORDER — AMOXICILLIN-POT CLAVULANATE 875-125 MG PO TABS: 1.0000 | ORAL_TABLET | Freq: Two times a day (BID) | ORAL | 0 refills | Status: AC

## 2023-03-20 NOTE — Discharge Instructions (Addendum)
Advised patient to take medication as directed with food to completion.  Advised patient to take prednisone with first dose of Augmentin for the next 10 days.  Encouraged increase daily water intake to 64 ounces per day while taking these medications.  Advised if symptoms worsen and/or unresolved please follow-up with PCP, ENT (contact information below on this AVS) or here for further evaluation.

## 2023-03-20 NOTE — ED Provider Notes (Signed)
[facility]    CSN: [id number] Arrival date & time: 03/20/23  1548      History   Chief Complaint Chief Complaint  Patient presents with   Nasal Congestion    Sinus problems - Entered by patient    HPI Alexandra Welch is a 47 y.o. female.   HPI pleasant 47 year old female presents with sinus issues and swelling in her nose with congestion for 1 month.  PMH significant for BCC, PVC, and GERD.  Past Medical History:  Diagnosis Date   Anxiety    Asthma    Basal cell carcinoma of skin    Dr. Lovenia Kim   Fibrocystic breast changes 08/27/2014   No hx breast biopsy. No FH first degree relative with breast or ovarian ca. Getting yearly mammograms per prior PCP recs.    GERD (gastroesophageal reflux disease)    PVC (premature ventricular contraction)    negative cardiac work up    Patient Active Problem List   Diagnosis Date Noted   History of cervical dysplasia 01/05/2022   White coat syndrome without diagnosis of hypertension 01/05/2022   History of LAVH 01/05/2022   Mild intermittent asthma 08/27/2014   Fibrocystic breast changes 08/27/2014   Anxiety state 11/29/2007    Past Surgical History:  Procedure Laterality Date   LAPAROSCOPIC HYSTERECTOMY  2007   for cervical dysplasia    OB History     Gravida  5   Para  2   Term      Preterm      AB  3   Living  2      SAB  3   IAB      Ectopic      Multiple      Live Births               Home Medications    Prior to Admission medications   Medication Sig Start Date End Date Taking? Authorizing Provider  amoxicillin-clavulanate (AUGMENTIN) 875-125 MG tablet Take 1 tablet by mouth 2 (two) times daily for 10 days. 03/20/23 03/30/23 Yes Trevor Iha, FNP  predniSONE (STERAPRED UNI-PAK 21 TAB) 10 MG (21) TBPK tablet Take by mouth daily. Take 6 tabs by mouth daily  for 2 days, then 5 tabs for 2 days, then 4 tabs for 2 days, then 3 tabs for 2 days, 2 tabs for 2 days, then 1 tab by mouth  daily for 2 days 03/20/23  Yes Trevor Iha, FNP  albuterol (VENTOLIN HFA) 108 (90 Base) MCG/ACT inhaler Inhale 2 puffs into the lungs 2 (two) times daily as needed. 04/26/21   Deeann Saint, MD  ALPRAZolam (XANAX) 0.5 MG tablet TAKE 1 TABLET (0.5 MG TOTAL) BY MOUTH 3 (THREE) TIMES DAILY AS NEEDED. FOR ANXIETY 01/31/22   Cottle, Steva Ready., MD  Cholecalciferol (VITAMIN D PO) Take by mouth daily.    [provider]  fexofenadine (ALLEGRA ALLERGY) 180 MG tablet Take 1 tablet (180 mg total) by mouth daily. 06/18/22 07/18/22  Trevor Iha, FNP  fluconazole (DIFLUCAN) 150 MG tablet Take 1 tablet (150 mg total) by mouth daily. Repeat in 1 week if needed 03/23/22   Eustace Moore, MD  loratadine (CLARITIN) 10 MG tablet Take 10 mg by mouth daily.    [provider]  promethazine-dextromethorphan (PROMETHAZINE-DM) 6.25-15 MG/5ML syrup Take 5 mLs by mouth 2 (two) times daily as needed for cough. 06/18/22   Trevor Iha, FNP  sertraline (ZOLOFT) 100 MG tablet Take 2 tablets (  200 mg total) by mouth daily. 10/11/21   Cottle, Steva Ready., MD    Family History Family History  Problem Relation Age of Onset   Asthma Mother    Anxiety disorder Mother    Post-traumatic stress disorder Mother    Lung cancer Father        smoker and worked in [employer] mine   Anxiety disorder Sister    Anxiety disorder Sister    Suicidality Brother        gambling died by suicide   Esophageal cancer Paternal Uncle    Obsessive Compulsive Disorder Son    Colon cancer Neg Hx    Colon polyps Neg Hx    Stomach cancer Neg Hx    Rectal cancer Neg Hx     Social History Social History   Tobacco Use   Smoking status: Never   Smokeless tobacco: Never  Vaping Use   Vaping status: Never Used  Substance Use Topics   Alcohol use: Not Currently   Drug use: No     Allergies   Doxycycline, Shellfish allergy, and Nsaids   Review of Systems Review of Systems  HENT:  Positive for congestion, sinus  pressure and sinus pain.   All other systems reviewed and are negative.    Physical Exam Triage Vital Signs ED Triage Vitals  Encounter Vitals Group     BP      Systolic BP Percentile      Diastolic BP Percentile      Pulse      Resp      Temp      Temp src      SpO2      Weight      Height      Head Circumference      Peak Flow      Pain Score      Pain Loc      Pain Education      Exclude from Growth Chart    No data found.  Updated Vital Signs BP 135/85   Pulse 98   Temp 98.7 F (37.1 C) (Oral)   Resp 16   LMP 08/29/2005 (Approximate)   SpO2 98%    Physical Exam Vitals and nursing note reviewed.  Constitutional:      Appearance: Normal appearance. She is obese. She is ill-appearing.  HENT:     Head: Normocephalic and atraumatic.     Right Ear: Tympanic membrane and external ear normal.     Left Ear: Tympanic membrane and external ear normal.     Ears:     Comments: Moderate eustachian tube dysfunction noted bilaterally    Nose:     Right Sinus: Maxillary sinus tenderness present.     Left Sinus: Maxillary sinus tenderness present.     Comments: Turbinates are erythematous/edematous    Mouth/Throat:     Mouth: Mucous membranes are moist.     Pharynx: Oropharynx is clear. Uvula midline.     Tonsils: 4+ on the right. 4+ on the left.  Eyes:     Extraocular Movements: Extraocular movements intact.     Conjunctiva/sclera: Conjunctivae normal.     Pupils: Pupils are equal, round, and reactive to light.  Cardiovascular:     Rate and Rhythm: Normal rate and regular rhythm.     Pulses: Normal pulses.     Heart sounds: Normal heart sounds. No murmur heard. Pulmonary:     Effort: Pulmonary effort is normal.  Breath sounds: Normal breath sounds. No wheezing, rhonchi or rales.  Musculoskeletal:        General: Normal range of motion.     Cervical back: Normal range of motion and neck supple.  Skin:    General: Skin is warm and dry.  Neurological:      General: No focal deficit present.     Mental Status: She is alert and oriented to person, place, and time. Mental status is at baseline.  Psychiatric:        Mood and Affect: Mood normal.        Behavior: Behavior normal.        Thought Content: Thought content normal.      UC Treatments / Results  Labs (all labs ordered are listed, but only abnormal results are displayed) Labs Reviewed - No data to display  EKG   Radiology No results found.  Procedures Procedures (including critical care time)  Medications Ordered in UC Medications - No data to display  Initial Impression / Assessment and Plan / UC Course  I have reviewed the triage vital signs and the nursing notes.  Pertinent labs & imaging results that were available during my care of the patient were reviewed by me and considered in my medical decision making (see chart for details).     MDM: 1.  Acute maxillary sinusitis, recurrence not specified-Rx'd Augmentin 875/125 mg tablet twice daily x 10 days; 2.  Sinus pressure-Rx'd Sterapred Unipak (tapering from 60 mg to 10 mg over 10 days). Advised patient to take medication as directed with food to completion.  Advised patient to take prednisone with first dose of Augmentin for the next 10 days.  Encouraged increase daily water intake to 64 ounces per day while taking these medications.  Advised if symptoms worsen and/or unresolved please follow-up with PCP, ENT (contact information below on this AVS) or here for further evaluation.  Patient discharged home, hemodynamically stable.   Final Clinical Impressions(s) / UC Diagnoses   Final diagnoses:  Acute maxillary sinusitis, recurrence not specified  Sinus pressure     Discharge Instructions      Advised patient to take medication as directed with food to completion.  Advised patient to take prednisone with first dose of Augmentin for the next 10 days.  Encouraged increase daily water intake to 64 ounces per day  while taking these medications.  Advised if symptoms worsen and/or unresolved please follow-up with PCP, ENT (contact information below on this AVS) or here for further evaluation.      ED Prescriptions     Medication Sig Dispense Auth. Provider   amoxicillin-clavulanate (AUGMENTIN) 875-125 MG tablet Take 1 tablet by mouth 2 (two) times daily for 10 days. 20 tablet Trevor Iha, FNP   predniSONE (STERAPRED UNI-PAK 21 TAB) 10 MG (21) TBPK tablet Take by mouth daily. Take 6 tabs by mouth daily  for 2 days, then 5 tabs for 2 days, then 4 tabs for 2 days, then 3 tabs for 2 days, 2 tabs for 2 days, then 1 tab by mouth daily for 2 days 42 tablet Trevor Iha, FNP      PDMP not reviewed this encounter.   Trevor Iha, FNP 03/20/23 [phone]

## 2023-03-20 NOTE — ED Triage Notes (Signed)
Pt presents to uc with co of sinus issues recently with swelling in her nose and facial area and congestion for one month   Pt reports in the past she saw an ent who had to remove a polyp and accidenty gave her a hole in her septum and she said that area is bothing her more now as it is swollen.

## 2023-03-22 DIAGNOSIS — J0300 Acute streptococcal tonsillitis, unspecified: Secondary | ICD-10-CM | POA: Diagnosis not present

## 2023-03-25 DIAGNOSIS — J039 Acute tonsillitis, unspecified: Secondary | ICD-10-CM | POA: Diagnosis not present

## 2023-03-25 DIAGNOSIS — J029 Acute pharyngitis, unspecified: Secondary | ICD-10-CM | POA: Diagnosis not present

## 2023-03-27 DIAGNOSIS — R0789 Other chest pain: Secondary | ICD-10-CM | POA: Diagnosis not present

## 2023-03-27 DIAGNOSIS — J039 Acute tonsillitis, unspecified: Secondary | ICD-10-CM | POA: Diagnosis not present

## 2023-03-27 DIAGNOSIS — R220 Localized swelling, mass and lump, head: Secondary | ICD-10-CM | POA: Diagnosis not present

## 2023-03-27 DIAGNOSIS — R609 Edema, unspecified: Secondary | ICD-10-CM | POA: Diagnosis not present

## 2023-03-27 DIAGNOSIS — J029 Acute pharyngitis, unspecified: Secondary | ICD-10-CM | POA: Diagnosis not present

## 2023-03-27 DIAGNOSIS — R000 Tachycardia, unspecified: Secondary | ICD-10-CM | POA: Diagnosis not present

## 2023-03-27 DIAGNOSIS — Z881 Allergy status to other antibiotic agents status: Secondary | ICD-10-CM | POA: Diagnosis not present

## 2023-03-29 ENCOUNTER — Encounter: Payer: Self-pay | Admitting: Family Medicine

## 2023-03-29 ENCOUNTER — Ambulatory Visit: Payer: BC Managed Care – PPO | Admitting: Family Medicine

## 2023-03-29 VITALS — BP 134/96 | HR 82 | Temp 98.4°F | Wt 170.0 lb

## 2023-03-29 DIAGNOSIS — J039 Acute tonsillitis, unspecified: Secondary | ICD-10-CM

## 2023-03-29 DIAGNOSIS — R1312 Dysphagia, oropharyngeal phase: Secondary | ICD-10-CM

## 2023-03-29 DIAGNOSIS — Z9189 Other specified personal risk factors, not elsewhere classified: Secondary | ICD-10-CM

## 2023-03-29 NOTE — Patient Instructions (Signed)
You have an appointment tomorrow 03/30/2023 with [facility] at 1:15 PM with Apolinar Junes.  You will need to bring your insurance card, copy of photo ID, and your med list.

## 2023-03-29 NOTE — Progress Notes (Signed)
Established Patient Office Visit   Subjective  Patient ID: Alexandra Welch, female    DOB: 1976-07-18  Age: 47 y.o. MRN: [id number]  Chief Complaint  Patient presents with   Referral    Went to UC in [place], and tested + for strep . Went back in 3 days and got more abx  and steroids. Went to ED for tonsils, is not able to lay down due to swelling. Was told she need to see ENT before returning to work    HPI  Patient is a 47 year old female with whitecoat hypertension, mild intermittent asthma, history of anxiety, who seen for ongoing acute concern.    Seen at [facility] 03/20/23 for sinusitis.  Given Augmentin 875-125 mg and a 12 d Sterapred Unipak.  Seen 03/22/2023 at [facility] in [place] for sore throat, fever, chills, body aches.  Rapid strep positive.  X-ray neck negative.  Given amoxicillin 875 x 10 days.   Xray soft tissue neck:  No swelling or mass in pharynx.  tonsillar/adenoidal tissue WNL.  No ballooning of hypopharynx.  Epiglottis and aryepiglottic folds normal.  No foreign body or mass.  Multilevel degenerative changes in C-spine most advanced moderate at C5-C6.     Pt returned on 03/25/2023 for continued symptoms. Monopot negative. Xray neck soft tissue done, and negative for acute findings.  Given dose of Rocephin and Decadron in UC, Z-Pak, lidocaine solution.    Symptoms initially improved then returned. Seen in ED 03/27/2023.  WBC 7.9.  Throat culture obtained negative (results reviewed on pt's MyChart by this provider).  Given Decadron and clindamycin 300 mg IV.  Sent home with prednisone taper and clindamycin 150 mg TID x 10 days.  Advised to follow-up with ENT.  Given 1 wk excuse note for work.   Pt states she is feeling better energy-wise and throat is not really as sore, but tonsils remain extremely enlarged with exudate.  Voice is different.  Feels "slight pressure/different sensation in back of throat pushing up into soft palate".  Having difficulty  eating, swallowing, and at night breathing.  Waking up gasping.  Unable to lie flat.  Steroids have caused wt gain.   Patient is an EMT.  Patient Active Problem List   Diagnosis Date Noted   History of cervical dysplasia 01/05/2022   White coat syndrome without diagnosis of hypertension 01/05/2022   History of LAVH 01/05/2022   Mild intermittent asthma 08/27/2014   Fibrocystic breast changes 08/27/2014   Anxiety state 11/29/2007   Past Medical History:  Diagnosis Date   Anxiety    Asthma    Basal cell carcinoma of skin    Dr. Lovenia Kim   Fibrocystic breast changes 08/27/2014   No hx breast biopsy. No FH first degree relative with breast or ovarian ca. Getting yearly mammograms per prior PCP recs.    GERD (gastroesophageal reflux disease)    PVC (premature ventricular contraction)    negative cardiac work up   Past Surgical History:  Procedure Laterality Date   LAPAROSCOPIC HYSTERECTOMY  2007   for cervical dysplasia   Social History   Tobacco Use   Smoking status: Never   Smokeless tobacco: Never  Vaping Use   Vaping status: Never Used  Substance Use Topics   Alcohol use: Not Currently   Drug use: No   Family History  Problem Relation Age of Onset   Asthma Mother    Anxiety disorder Mother    Post-traumatic stress disorder Mother  Lung cancer Father        smoker and worked in [employer] mine   Anxiety disorder Sister    Anxiety disorder Sister    Suicidality Brother        gambling died by suicide   Esophageal cancer Paternal Uncle    Obsessive Compulsive Disorder Son    Colon cancer Neg Hx    Colon polyps Neg Hx    Stomach cancer Neg Hx    Rectal cancer Neg Hx    Allergies  Allergen Reactions   Doxycycline Swelling and Rash    Mild upper lip swelling, facial rash.    Shellfish Allergy Swelling   Nsaids Other (See Comments)    All have made her tongue itch      ROS Negative unless stated above    Objective:     BP (!) 134/96 (BP Location:  Left Arm, Patient Position: Standing, Cuff Size: Normal)   Pulse 82   Temp 98.4 F (36.9 C) (Oral)   Wt 170 lb (77.1 kg)   LMP 08/29/2005 (Approximate)   SpO2 98%   BMI 31.09 kg/m  BP Readings from Last 3 Encounters:  03/29/23 (!) 134/96  03/20/23 135/85  09/23/22 (!) 145/98   Wt Readings from Last 3 Encounters:  03/29/23 170 lb (77.1 kg)  05/02/22 155 lb (70.3 kg)  03/23/22 158 lb (71.7 kg)      Physical Exam Constitutional:      General: She is awake. She is not in acute distress.    Appearance: Normal appearance.  HENT:     Head: Normocephalic and atraumatic.     Nose: Nose normal.     Mouth/Throat:     Mouth: Mucous membranes are moist.     Pharynx: Pharyngeal swelling and oropharyngeal exudate present.     Tonsils: Tonsillar exudate and tonsillar abscess present. 4+ on the right. 3+ on the left.  Cardiovascular:     Rate and Rhythm: Normal rate and regular rhythm.     Heart sounds: Normal heart sounds. No murmur heard.    No gallop.  Pulmonary:     Effort: Pulmonary effort is normal. No respiratory distress.     Breath sounds: Normal breath sounds. No wheezing, rhonchi or rales.  Skin:    General: Skin is warm and dry.  Neurological:     Mental Status: She is alert and oriented to person, place, and time.  Psychiatric:        Behavior: Behavior is cooperative.      No results found for any visits on 03/29/23.    Assessment & Plan:  Tonsillitis with exudate  Oropharyngeal dysphagia  At risk for apnea  Patient with continued tonsillar enlargement and exudate despite numerous rounds of antibiotics and steroids since 03/22/23.  Symptoms concerning for tonsillar or peritonsillar abscess, epiglottitis.  Expressed concern for possible airway compromise at night.  Initially discussed having patient proceed to ED today, however given strict ED precautions for worsening symptoms.  [facility] contacted by this provider.  Pt scheduled for tomorrow, 03/30/2023 at  1:15 PM with Apolinar Junes.   On day of service, 41 minutes spent caring for this patient face-to-face, reviewing the chart, counseling and/or coordinating care for plan and treatment of diagnosis below.    Return if symptoms worsen or fail to improve.   Deeann Saint, MD

## 2023-03-30 DIAGNOSIS — J039 Acute tonsillitis, unspecified: Secondary | ICD-10-CM | POA: Diagnosis not present

## 2023-04-03 DIAGNOSIS — H04122 Dry eye syndrome of left lacrimal gland: Secondary | ICD-10-CM | POA: Diagnosis not present

## 2023-04-06 DIAGNOSIS — J039 Acute tonsillitis, unspecified: Secondary | ICD-10-CM | POA: Diagnosis not present

## 2023-04-10 ENCOUNTER — Encounter: Payer: Self-pay | Admitting: Family Medicine

## 2023-04-10 DIAGNOSIS — J4520 Mild intermittent asthma, uncomplicated: Secondary | ICD-10-CM

## 2023-04-10 MED ORDER — ALBUTEROL SULFATE HFA 108 (90 BASE) MCG/ACT IN AERS: 2.0000 | INHALATION_SPRAY | Freq: Two times a day (BID) | RESPIRATORY_TRACT | 1 refills | Status: AC | PRN

## 2023-04-25 ENCOUNTER — Other Ambulatory Visit (HOSPITAL_COMMUNITY)
Admission: RE | Admit: 2023-04-25 | Discharge: 2023-04-25 | Disposition: A | Discharge status: Home / Self Care | Payer: BC Managed Care – PPO | Source: Ambulatory Visit | Attending: Obstetrics & Gynecology | Admitting: Obstetrics & Gynecology

## 2023-04-25 ENCOUNTER — Encounter (HOSPITAL_BASED_OUTPATIENT_CLINIC_OR_DEPARTMENT_OTHER): Payer: Self-pay | Admitting: Obstetrics & Gynecology

## 2023-04-25 ENCOUNTER — Ambulatory Visit (HOSPITAL_BASED_OUTPATIENT_CLINIC_OR_DEPARTMENT_OTHER): Payer: BC Managed Care – PPO | Admitting: Obstetrics & Gynecology

## 2023-04-25 VITALS — BP 132/80 | HR 76 | Ht 62.0 in | Wt 167.0 lb

## 2023-04-25 DIAGNOSIS — Z124 Encounter for screening for malignant neoplasm of cervix: Secondary | ICD-10-CM

## 2023-04-25 DIAGNOSIS — N816 Rectocele: Secondary | ICD-10-CM | POA: Diagnosis not present

## 2023-04-25 DIAGNOSIS — Z1231 Encounter for screening mammogram for malignant neoplasm of breast: Secondary | ICD-10-CM | POA: Diagnosis not present

## 2023-04-25 DIAGNOSIS — Z01419 Encounter for gynecological examination (general) (routine) without abnormal findings: Secondary | ICD-10-CM | POA: Diagnosis not present

## 2023-04-25 DIAGNOSIS — R5382 Chronic fatigue, unspecified: Secondary | ICD-10-CM

## 2023-04-25 DIAGNOSIS — Z90710 Acquired absence of both cervix and uterus: Secondary | ICD-10-CM

## 2023-04-25 DIAGNOSIS — Z87410 Personal history of cervical dysplasia: Secondary | ICD-10-CM

## 2023-04-25 NOTE — Progress Notes (Signed)
47 y.o. [id number] Divorced White or Caucasian female here for annual exam.  Has been experiencing some bladder pressure with urinary urgency.  She thinks she can feel a "ball" with her finger.  Denies vaginal bleeding.  Feels like this has acutely worsened.  Days she does a lot of lifting, she feels a lot more pressure.    Is having some more fatigue as well.    Patient's last menstrual period was 08/29/2005 (approximate).          Sexually active: Yes.    The current method of family planning is status post hysterectomy.    Smoker:  no  Health Maintenance: Pap:  10/04/2019 Normal History of abnormal Pap:  yes MMG:  12/30/2020 Negative Colonoscopy:  09/28/2021 Normal Screening Labs: TSH and B12   reports that she has never smoked. She has never used smokeless tobacco. She reports that she does not currently use alcohol. She reports that she does not use drugs.  Past Medical History:  Diagnosis Date   Anxiety    Asthma    Basal cell carcinoma of skin    Dr. Lovenia Kim   Fibrocystic breast changes 08/27/2014   No hx breast biopsy. No FH first degree relative with breast or ovarian ca. Getting yearly mammograms per prior PCP recs.    GERD (gastroesophageal reflux disease)    PVC (premature ventricular contraction)    negative cardiac work up    Past Surgical History:  Procedure Laterality Date   LAPAROSCOPIC HYSTERECTOMY  2007   for cervical dysplasia    Current Outpatient Medications  Medication Sig Dispense Refill   albuterol (VENTOLIN HFA) 108 (90 Base) MCG/ACT inhaler Inhale 2 puffs into the lungs 2 (two) times daily as needed. 3.7 g 1   ALPRAZolam (XANAX) 0.5 MG tablet TAKE 1 TABLET (0.5 MG TOTAL) BY MOUTH 3 (THREE) TIMES DAILY AS NEEDED. FOR ANXIETY 60 tablet 1   Cholecalciferol (VITAMIN D PO) Take by mouth daily.     loratadine (CLARITIN) 10 MG tablet Take 10 mg by mouth daily.     No current facility-administered medications for this visit.    Family History   Problem Relation Age of Onset   Asthma Mother    Anxiety disorder Mother    Post-traumatic stress disorder Mother    Lung cancer Father        smoker and worked in [employer] mine   Anxiety disorder Sister    Anxiety disorder Sister    Suicidality Brother        gambling died by suicide   Esophageal cancer Paternal Uncle    Obsessive Compulsive Disorder Son    Colon cancer Neg Hx    Colon polyps Neg Hx    Stomach cancer Neg Hx    Rectal cancer Neg Hx     ROS: Constitutional: negative Genitourinary:negative  Exam:   BP 132/80 (BP Location: Left Arm, Patient Position: Sitting, Cuff Size: Normal)   Pulse 76   Ht 5\' 2"  (1.575 m)   Wt 167 lb (75.8 kg)   LMP 08/29/2005 (Approximate)   BMI 30.54 kg/m   Height: 5\' 2"  (157.5 cm)  General appearance: alert, cooperative and appears stated age Head: Normocephalic, without obvious abnormality, atraumatic Neck: no adenopathy, supple, symmetrical, trachea midline and thyroid normal to inspection and palpation Lungs: clear to auscultation bilaterally Breasts: normal appearance, no masses or tenderness Heart: regular rate and rhythm Abdomen: soft, non-tender; bowel sounds normal; no masses,  no organomegaly Extremities: extremities normal, atraumatic, no cyanosis  or edema Skin: Skin color, texture, turgor normal. No rashes or lesions Lymph nodes: Cervical, supraclavicular, and axillary nodes normal. No abnormal inguinal nodes palpated Neurologic: Grossly normal   Pelvic: External genitalia:  no lesions              Urethra:  normal appearing urethra with no masses, tenderness or lesions              Bartholins and Skenes: normal                 Vagina: normal appearing vagina with normal color and no discharge, no lesions, rectocele present              Cervix: absent              Pap taken: Yes.   Bimanual Exam:  Uterus:  uterus absent              Adnexa: normal adnexa               Rectovaginal: Confirms               Anus:   normal sphincter tone, no lesions  Chaperone, Ina Homes, CMA, was present for exam.  Assessment/Plan: 1. Well woman exam with routine gynecological exam - Pap smear and HR HPV obtained today - Mammogram 12/30/2020.  Pt aware due. - Colonoscopy 09/28/2021, normal. - lab work ordered today - vaccines reviewed/updated  2. Cervical cancer screening - Cytology - PAP( [place])  3. Encounter for screening mammogram for malignant neoplasm of breast - MM 3D SCREENING MAMMOGRAM BILATERAL BREAST; Future  4. Chronic fatigue - TSH - B12  5. Rectocele  6. History of LAVH  7. History of cervical dysplasia

## 2023-04-26 LAB — TSH: TSH: 1.35 u[IU]/mL (ref 0.450–4.500)

## 2023-04-26 LAB — VITAMIN B12: Vitamin B-12: 318 pg/mL (ref 232–1245)

## 2023-05-04 LAB — CYTOLOGY - PAP
Comment: NEGATIVE
Diagnosis: NEGATIVE
High risk HPV: NEGATIVE

## 2023-05-29 ENCOUNTER — Other Ambulatory Visit: Payer: Self-pay | Admitting: Family Medicine

## 2023-05-29 DIAGNOSIS — J4520 Mild intermittent asthma, uncomplicated: Secondary | ICD-10-CM

## 2023-08-17 DIAGNOSIS — L820 Inflamed seborrheic keratosis: Secondary | ICD-10-CM | POA: Diagnosis not present

## 2023-08-17 DIAGNOSIS — D1801 Hemangioma of skin and subcutaneous tissue: Secondary | ICD-10-CM | POA: Diagnosis not present

## 2023-08-17 DIAGNOSIS — D485 Neoplasm of uncertain behavior of skin: Secondary | ICD-10-CM | POA: Diagnosis not present

## 2023-09-11 DIAGNOSIS — H401131 Primary open-angle glaucoma, bilateral, mild stage: Secondary | ICD-10-CM | POA: Diagnosis not present

## 2023-09-19 ENCOUNTER — Other Ambulatory Visit: Payer: Self-pay

## 2023-09-19 ENCOUNTER — Ambulatory Visit
Admission: RE | Admit: 2023-09-19 | Discharge: 2023-09-19 | Disposition: A | Discharge status: Home / Self Care | Payer: 59 | Source: Ambulatory Visit

## 2023-09-19 VITALS — BP 136/90 | HR 88 | Temp 98.7°F | Resp 16

## 2023-09-19 DIAGNOSIS — R350 Frequency of micturition: Secondary | ICD-10-CM

## 2023-09-19 LAB — POCT URINALYSIS DIP (MANUAL ENTRY)
Bilirubin, UA: NEGATIVE
Blood, UA: NEGATIVE
Glucose, UA: NEGATIVE mg/dL
Ketones, POC UA: NEGATIVE mg/dL
Leukocytes, UA: NEGATIVE
Nitrite, UA: NEGATIVE
Protein Ur, POC: NEGATIVE mg/dL
Spec Grav, UA: 1.015
Urobilinogen, UA: 0.2 U/dL
pH, UA: 6

## 2023-09-19 NOTE — ED Provider Notes (Signed)
[facility]    CSN: [id number] Arrival date & time: 09/19/23  1501      History   Chief Complaint Chief Complaint  Patient presents with   Urinary Frequency    HPI Alexandra Welch is a 48 y.o. female.   HPI 48 year old female presents with urinary frequency and pressure for 3 days and she is concerned with a UTI.  Past Medical History:  Diagnosis Date   Anxiety    Asthma    Basal cell carcinoma of skin    Dr. Lovenia Kim   Fibrocystic breast changes 08/27/2014   No hx breast biopsy. No FH first degree relative with breast or ovarian ca. Getting yearly mammograms per prior PCP recs.    GERD (gastroesophageal reflux disease)    PVC (premature ventricular contraction)    negative cardiac work up    Patient Active Problem List   Diagnosis Date Noted   History of cervical dysplasia 01/05/2022   White coat syndrome without diagnosis of hypertension 01/05/2022   History of LAVH 01/05/2022   Mild intermittent asthma 08/27/2014   Fibrocystic breast changes 08/27/2014   Anxiety state 11/29/2007    Past Surgical History:  Procedure Laterality Date   LAPAROSCOPIC HYSTERECTOMY  2007   for cervical dysplasia    OB History     Gravida  5   Para  2   Term      Preterm      AB  3   Living  2      SAB  3   IAB      Ectopic      Multiple      Live Births               Home Medications    Prior to Admission medications   Medication Sig Start Date End Date Taking? Authorizing Provider  timolol (TIMOPTIC) 0.5 % ophthalmic solution 1 drop 2 (two) times daily. 09/11/23  Yes [provider]  albuterol (VENTOLIN HFA) 108 (90 Base) MCG/ACT inhaler Inhale 2 puffs into the lungs 2 (two) times daily as needed. 04/10/23   Deeann Saint, MD  ALPRAZolam (XANAX) 0.5 MG tablet TAKE 1 TABLET (0.5 MG TOTAL) BY MOUTH 3 (THREE) TIMES DAILY AS NEEDED. FOR ANXIETY 01/31/22   Cottle, Steva Ready., MD  Cholecalciferol (VITAMIN D PO) Take by mouth daily.     [provider]  loratadine (CLARITIN) 10 MG tablet Take 10 mg by mouth daily.    [provider]    Family History Family History  Problem Relation Age of Onset   Asthma Mother    Anxiety disorder Mother    Post-traumatic stress disorder Mother    Lung cancer Father        smoker and worked in [employer] mine   Anxiety disorder Sister    Anxiety disorder Sister    Suicidality Brother        gambling died by suicide   Esophageal cancer Paternal Uncle    Obsessive Compulsive Disorder Son    Colon cancer Neg Hx    Colon polyps Neg Hx    Stomach cancer Neg Hx    Rectal cancer Neg Hx     Social History Social History   Tobacco Use   Smoking status: Never   Smokeless tobacco: Never  Vaping Use   Vaping status: Never Used  Substance Use Topics   Alcohol use: Not Currently   Drug use: No     Allergies  Sulfa antibiotics, Doxycycline, Shellfish allergy, and Nsaids   Review of Systems Review of Systems   Physical Exam Triage Vital Signs ED Triage Vitals  Encounter Vitals Group     BP 09/19/23 1539 (!) 136/90     Systolic BP Percentile --      Diastolic BP Percentile --      Pulse Rate 09/19/23 1539 88     Resp 09/19/23 1539 16     Temp 09/19/23 1539 98.7 F (37.1 C)     Temp src --      SpO2 09/19/23 1539 98 %     Weight --      Height --      Head Circumference --      Peak Flow --      Pain Score 09/19/23 1532 2     Pain Loc --      Pain Education --      Exclude from Growth Chart --    No data found.  Updated Vital Signs BP (!) 136/90   Pulse 88   Temp 98.7 F (37.1 C)   Resp 16   LMP 08/29/2005 (Approximate)   SpO2 98%      Physical Exam Vitals and nursing note reviewed.  Constitutional:      Appearance: Normal appearance. She is normal weight.  HENT:     Head: Normocephalic and atraumatic.     Mouth/Throat:     Mouth: Mucous membranes are moist.     Pharynx: Oropharynx is clear.  Eyes:     Extraocular Movements:  Extraocular movements intact.     Conjunctiva/sclera: Conjunctivae normal.     Pupils: Pupils are equal, round, and reactive to light.  Cardiovascular:     Rate and Rhythm: Normal rate and regular rhythm.     Pulses: Normal pulses.     Heart sounds: Normal heart sounds.  Pulmonary:     Effort: Pulmonary effort is normal.     Breath sounds: Normal breath sounds. No wheezing, rhonchi or rales.  Musculoskeletal:        General: Normal range of motion.     Cervical back: Normal range of motion and neck supple.  Skin:    General: Skin is warm and dry.  Neurological:     General: No focal deficit present.     Mental Status: She is alert and oriented to person, place, and time. Mental status is at baseline.  Psychiatric:        Mood and Affect: Mood normal.        Behavior: Behavior normal.      UC Treatments / Results  Labs (all labs ordered are listed, but only abnormal results are displayed) Labs Reviewed  POCT URINALYSIS DIP (MANUAL ENTRY) - Normal    EKG   Radiology No results found.  Procedures Procedures (including critical care time)  Medications Ordered in UC Medications - No data to display  Initial Impression / Assessment and Plan / UC Course  I have reviewed the triage vital signs and the nursing notes.  Pertinent labs & imaging results that were available during my care of the patient were reviewed by me and considered in my medical decision making (see chart for details).     MDM: 1.  Urinary frequency-UA revealed above. Advised patient that urinalysis completely normal today unable to get urinary culture.  Encouraged to increase daily water intake to 64 ounces per day.  Advised if symptoms worsen and/or unresolved please follow-up with your PCP  or [facility] for further evaluation. Final Clinical Impressions(s) / UC Diagnoses   Final diagnoses:  Urinary frequency     Discharge Instructions      Advised patient that urinalysis completely  normal today unable to get urinary culture.  Encouraged to increase daily water intake to 64 ounces per day.  Advised if symptoms worsen and/or unresolved please follow-up with your PCP or [facility] for further evaluation.     ED Prescriptions   None    PDMP not reviewed this encounter.   Trevor Iha, FNP 09/19/23 [phone]

## 2023-09-19 NOTE — ED Triage Notes (Signed)
Pt presents to uc with co of urinary frequency and pressure. Pt reports symptoms started Saturday. She is concerned for an UTI

## 2023-09-19 NOTE — Discharge Instructions (Addendum)
Advised patient that urinalysis completely normal today unable to get urinary culture.  Encouraged to increase daily water intake to 64 ounces per day.  Advised if symptoms worsen and/or unresolved please follow-up with your PCP or [facility] for further evaluation.

## 2023-10-20 ENCOUNTER — Telehealth: Payer: Self-pay | Admitting: *Deleted

## 2023-10-20 NOTE — Telephone Encounter (Signed)
Have left three messages in attempt to schedule f/u OV with Dr. Jacques Navy.  If patient returns call, can schedule her on 10/26/2023 at 10:40 am (overbook okay per Dr. Jacques Navy).  Will mail letter out today.

## 2023-11-02 ENCOUNTER — Encounter: Payer: Self-pay | Admitting: *Deleted

## 2023-11-08 NOTE — Progress Notes (Signed)
 Cardiology Office Note:  .   Date:  11/14/2023  ID:  Alexandra Welch, DOB 08/12/1976, MRN [id number] PCP: Deeann Saint, MD  [facility] Providers Cardiologist:  None      History of Present Illness: .   Alexandra Welch is a 48 y.o. female who presents for evaluation of palpitations.  In consultation performed for chest pain and palpitations in 2019 at that time we obtained a coronary CTA which demonstrated 0 calcium score and no evidence of CAD.  She does however have a persistent left SVC connecting to the coronary sinus with a dilated coronary sinus, normal right SVC, no anomalous pulmonary venous drainage and normal right sided heart chambers in 2019.  She also has a small caliber branch off of the mid LAD that travels cranially and appears to connect to the PA, likely of minimal consequence.  She presents for reevaluation for palpitations.  Her son works in the [employer] at [employer] and mention to me that she had started timolol eyedrops and her palpitations improved, we discussed briefly the systemic absorption capacity of timolol eyedrops.  We had previously completed a cardiac monitor which demonstrated PVCs, and incidentally patient had noted that the metoprolol she took prior to her coronary CTA gave her the best symptom free day that she had had in a long time, so we continued metoprolol succinate, originally 50 mg however she had some dizziness and this was decreased to 25 mg.  Patient had a complex streptococcal URI in the fall and since then has felt an unusual sensation of runs of tachycardia which can last for hours.  She will then feel a ice pick sharp chest pain and then she knows she is back in normal sinus rhythm.  Previously diagnosed with PVCs on monitor.  She feels a flopping sensation in her chest and will feel a tachycardic sensation.  She will occasionally have chest pressure with tachycardia, including with exertion.  Recall the patient had a normal coronary CTA in  2019.  She has previously had a negative sleep study.  Recall the patient has a persistent left SVC with no significant consequence but with a mildly dilated coronary sinus.  Previously on timolol eyedrops and during the day when these were in use she felt well, but when she felt they wore off she would have increased PVCs.  We discussed possible systemic absorption.  Previously on metoprolol however was continuously dizzy and this was discontinued.  Pressure is mildly elevated today but at home her blood pressure is 118/74 and consistently normal.  Her palpitations do not cause dizziness or lightheadedness, just tachycardia and chest discomfort.  When her heart rate drops in sinus rhythm to 50s, she is asymptomatic but will notice on her watch that her heart rate is low and it tracks with her pulse as an accurate bradycardia.  ROS: per hpi  Past Medical History:  Diagnosis Date   Anxiety    Asthma    Basal cell carcinoma of skin    Dr. Lovenia Kim   Fibrocystic breast changes 08/27/2014   No hx breast biopsy. No FH first degree relative with breast or ovarian ca. Getting yearly mammograms per prior PCP recs.    GERD (gastroesophageal reflux disease)    PVC (premature ventricular contraction)    negative cardiac work up    Studies Reviewed: Marland Kitchen   EKG Interpretation Date/Time:  Tuesday November 14 2023 08:42:57 EDT Ventricular Rate:  80 PR Interval:  124 QRS Duration:  86 QT Interval:  344 QTC Calculation: 396 R Axis:   -18  Text Interpretation: Normal sinus rhythm Normal ECG Confirmed by Weston Brass ([id number]) on 11/14/2023 8:44:32 AM    Coronary CTA 07/24/2019 19-0 calcium, no coronary artery disease , persistent left SVC. Risk Assessment/Calculations:      Physical Exam:   VS:  BP (!) 134/90   Pulse 77   Ht 5\' 3"  (1.6 m)   Wt 167 lb 12.8 oz (76.1 kg)   LMP 08/29/2005 (Approximate)   SpO2 98%   BMI 29.72 kg/m    Wt Readings from Last 3 Encounters:  11/14/23 167 lb 12.8  oz (76.1 kg)  04/25/23 167 lb (75.8 kg)  03/29/23 170 lb (77.1 kg)    GEN: Well nourished, well developed in no acute distress NECK: No JVD; No carotid bruits CARDIAC: RRR, no murmurs, rubs, gallops RESPIRATORY:  Clear to auscultation without rales, wheezing or rhonchi  ABDOMEN: Soft, non-tender, non-distended EXTREMITIES:  No edema; No deformity   ASSESSMENT AND PLAN: .    #palpitations #pvcs #Persistent left SVC Plan for cardiac monitor 2-week Zio to evaluate her palpitations and confirm whether this represents SVT which is what it sounds like.  She is able to break her symptoms with cold water, can continue to do so in the interim given intolerability to metoprolol previously with dizziness. Plan for echocardiogram to reevaluate cardiac chambers and structural and functional status of the heart in the setting of persistent left SVC, we will complete this as a bubble study as I do not see that this has been evaluated with echo previously.  #chest pain - no significant recurrence, mild chest discomfort when tachycardic, normal coronary CTA previously  # Elevated BP -Likely whitecoat hypertension, blood pressures are normal at home.     Dispo: f/u prn  Signed, Parke Poisson, MD

## 2023-11-14 ENCOUNTER — Ambulatory Visit: Attending: Internal Medicine | Admitting: Internal Medicine

## 2023-11-14 ENCOUNTER — Encounter: Payer: Self-pay | Admitting: Internal Medicine

## 2023-11-14 ENCOUNTER — Ambulatory Visit (INDEPENDENT_AMBULATORY_CARE_PROVIDER_SITE_OTHER)

## 2023-11-14 VITALS — BP 134/90 | HR 77 | Ht 63.0 in | Wt 167.8 lb

## 2023-11-14 DIAGNOSIS — Q261 Persistent left superior vena cava: Secondary | ICD-10-CM | POA: Diagnosis not present

## 2023-11-14 DIAGNOSIS — R002 Palpitations: Secondary | ICD-10-CM

## 2023-11-14 DIAGNOSIS — I493 Ventricular premature depolarization: Secondary | ICD-10-CM

## 2023-11-14 DIAGNOSIS — R079 Chest pain, unspecified: Secondary | ICD-10-CM

## 2023-11-14 NOTE — Progress Notes (Unsigned)
 Enrolled for [employer] to mail a ZIO XT long term holter monitor to the patients address on file.

## 2023-11-14 NOTE — Patient Instructions (Signed)
 Medication Instructions:  No Changes  Lab Work: None  Testing/Procedures:  ZIO XT- Long Term Monitor Instructions  Your physician has requested you wear a ZIO patch monitor for 14 days.  This is a single patch monitor. [employer] supplies one patch monitor per enrollment. Additional stickers are not available. Please do not apply patch if you will be having a Nuclear Stress Test,  Echocardiogram, Cardiac CT, MRI, or Chest Xray during the period you would be wearing the  monitor. The patch cannot be worn during these tests. You cannot remove and re-apply the  ZIO XT patch monitor.  Your ZIO patch monitor will be mailed 3 day USPS to your address on file. It may take 3-5 days  to receive your monitor after you have been enrolled.  Once you have received your monitor, please review the enclosed instructions. Your monitor  has already been registered assigning a specific monitor serial # to you.  Billing and Patient Assistance Program Information  We have supplied [employer] with any of your insurance information on file for billing purposes. [employer] offers a sliding scale Patient Assistance Program for patients that do not have  insurance, or whose insurance does not completely cover the cost of the ZIO monitor.  You must apply for the Patient Assistance Program to qualify for this discounted rate.  To apply, please call [employer] at [phone], select option 4, select option 2, ask to apply for  Patient Assistance Program. Meredeth Ide will ask your household income, and how many people  are in your household. They will quote your out-of-pocket cost based on that information.  [employer] will also be able to set up a 63-month, interest-free payment plan if needed.  Applying the monitor   Shave hair from upper left chest.  Hold abrader disc by orange tab. Rub abrader in 40 strokes over the upper left chest as  indicated in your monitor instructions.  Clean area with 4 enclosed alcohol pads. Let  dry.  Apply patch as indicated in monitor instructions. Patch will be placed under collarbone on left  side of chest with arrow pointing upward.  Rub patch adhesive wings for 2 minutes. Remove white label marked "1". Remove the white  label marked "2". Rub patch adhesive wings for 2 additional minutes.  While looking in a mirror, press and release button in center of patch. A small green light will  flash 3-4 times. This will be your only indicator that the monitor has been turned on.  Do not shower for the first 24 hours. You may shower after the first 24 hours.  Press the button if you feel a symptom. You will hear a small click. Record Date, Time and  Symptom in the Patient Logbook.  When you are ready to remove the patch, follow instructions on the last 2 pages of Patient  Logbook. Stick patch monitor onto the last page of Patient Logbook.  Place Patient Logbook in the blue and white box. Use locking tab on box and tape box closed  securely. The blue and white box has prepaid postage on it. Please place it in the mailbox as  soon as possible. Your physician should have your test results approximately 7 days after the  monitor has been mailed back to [facility].  Call [facility] Customer Care at [phone] if you have questions regarding  your ZIO XT patch monitor. Call them immediately if you see an orange light blinking on your  monitor.  If your monitor falls off in less  than 4 days, contact our Monitor department at [phone].  If your monitor becomes loose or falls off after 4 days call [employer] at [phone] for  suggestions on securing your monitor   Your physician has requested that you have an Echocardiogram Complete Bubble Study. Echocardiography is a painless test that uses sound waves to create images of your heart. It provides your doctor with information about the size and shape of your heart and how well your heart's chambers and valves are working. This  procedure takes approximately one hour. There are no restrictions for this procedure. Please do NOT wear cologne, perfume, aftershave, or lotions (deodorant is allowed). Please arrive 15 minutes prior to your appointment time. This will take place at [address]    Follow-Up: At [facility], you and your health needs are our priority.  As part of our continuing mission to provide you with exceptional heart care, we have created designated Provider Care Teams.  These Care Teams include your primary Cardiologist (physician) and Advanced Practice Providers (APPs -  Physician Assistants and Nurse Practitioners) who all work together to provide you with the care you need, when you need it.   Your next appointment:    PRN (as needed; call our office to schedule an appointment)  Provider:   Parke Poisson, MD

## 2023-12-12 ENCOUNTER — Ambulatory Visit (HOSPITAL_COMMUNITY): Attending: Cardiology

## 2023-12-12 DIAGNOSIS — R002 Palpitations: Secondary | ICD-10-CM

## 2023-12-12 DIAGNOSIS — Q261 Persistent left superior vena cava: Secondary | ICD-10-CM | POA: Diagnosis not present

## 2023-12-12 LAB — ECHOCARDIOGRAM COMPLETE BUBBLE STUDY
Area-P 1/2: 3.99 cm2
S' Lateral: 2.9 cm

## 2024-01-02 ENCOUNTER — Other Ambulatory Visit (HOSPITAL_BASED_OUTPATIENT_CLINIC_OR_DEPARTMENT_OTHER): Payer: Self-pay | Admitting: Certified Nurse Midwife

## 2024-01-02 ENCOUNTER — Encounter (HOSPITAL_BASED_OUTPATIENT_CLINIC_OR_DEPARTMENT_OTHER): Payer: Self-pay | Admitting: Obstetrics & Gynecology

## 2024-01-02 DIAGNOSIS — Z1283 Encounter for screening for malignant neoplasm of skin: Secondary | ICD-10-CM

## 2024-03-20 ENCOUNTER — Other Ambulatory Visit: Payer: Self-pay | Admitting: Obstetrics & Gynecology

## 2024-03-20 DIAGNOSIS — Z1231 Encounter for screening mammogram for malignant neoplasm of breast: Secondary | ICD-10-CM

## 2024-03-26 ENCOUNTER — Ambulatory Visit: Payer: Self-pay | Admitting: Internal Medicine

## 2024-03-26 DIAGNOSIS — R002 Palpitations: Secondary | ICD-10-CM | POA: Diagnosis not present

## 2024-03-26 DIAGNOSIS — I493 Ventricular premature depolarization: Secondary | ICD-10-CM | POA: Diagnosis not present

## 2024-04-04 ENCOUNTER — Encounter

## 2024-04-04 DIAGNOSIS — Z1231 Encounter for screening mammogram for malignant neoplasm of breast: Secondary | ICD-10-CM

## 2024-04-16 ENCOUNTER — Other Ambulatory Visit: Payer: Self-pay | Admitting: Obstetrics & Gynecology

## 2024-04-16 ENCOUNTER — Other Ambulatory Visit (HOSPITAL_BASED_OUTPATIENT_CLINIC_OR_DEPARTMENT_OTHER): Payer: Self-pay | Admitting: Obstetrics & Gynecology

## 2024-04-16 DIAGNOSIS — Z1231 Encounter for screening mammogram for malignant neoplasm of breast: Secondary | ICD-10-CM

## 2024-04-18 ENCOUNTER — Encounter

## 2024-04-18 DIAGNOSIS — Z1231 Encounter for screening mammogram for malignant neoplasm of breast: Secondary | ICD-10-CM

## 2024-04-22 ENCOUNTER — Ambulatory Visit
Admission: RE | Admit: 2024-04-22 | Discharge: 2024-04-22 | Disposition: A | Discharge status: Home / Self Care | Source: Ambulatory Visit

## 2024-04-22 DIAGNOSIS — Z1231 Encounter for screening mammogram for malignant neoplasm of breast: Secondary | ICD-10-CM

## 2024-04-24 NOTE — Progress Notes (Unsigned)
 ANNUAL EXAM Patient name: Alexandra Welch MRN [id number]  Date of birth: [birth year] Chief Complaint:   No chief complaint on file.  History of Present Illness:   Alexandra Welch is a 48 y.o. [id number] Caucasian female being seen today for a routine annual exam.  Current complaints: ***  Patient's last menstrual period was 08/29/2005 (approximate).   The pregnancy intention screening data noted above was reviewed. Potential methods of contraception were discussed. The patient elected to proceed with No data recorded.   Last pap 04/25/2023. Results were: NILM w/ HRHPV negative. H/O abnormal pap: yes Last mammogram: 04/22/2024. Results were: {normal, abnormal, n/a:23837}. Family h/o breast cancer: {yes***/no:23838} Last colonoscopy: 09/28/2021. Results were: abnormal polyp, small lipoma, anal papilla. Family h/o colorectal cancer: {yes***/no:23838}     04/25/2023    2:11 PM 03/29/2023    4:37 PM 01/05/2022    2:26 PM 12/22/2020    2:54 PM  Depression screen PHQ 2/9  Decreased Interest 0 0 0 0  Down, Depressed, Hopeless 0 0 0 0  PHQ - 2 Score 0 0 0 0  Altered sleeping  0    Tired, decreased energy  0    Change in appetite  0    Feeling bad or failure about yourself   0    Trouble concentrating  0    Moving slowly or fidgety/restless  0    Suicidal thoughts  0    PHQ-9 Score  0          03/29/2023    4:37 PM  GAD 7 : Generalized Anxiety Score  Nervous, Anxious, on Edge 0  Control/stop worrying 0  Worry too much - different things 0  Trouble relaxing 0  Restless 0  Easily annoyed or irritable 0  Afraid - awful might happen 0  Total GAD 7 Score 0     Review of Systems:   Pertinent items are noted in HPI Denies any headaches, blurred vision, fatigue, shortness of breath, chest pain, abdominal pain, abnormal vaginal discharge/itching/odor/irritation, problems with periods, bowel movements, urination, or intercourse unless otherwise stated above. Pertinent History Reviewed:   Reviewed past medical,surgical, social and family history.  Reviewed problem list, medications and allergies. Physical Assessment:  There were no vitals filed for this visit.There is no height or weight on file to calculate BMI.        Physical Examination:   General appearance - well appearing, and in no distress  Mental status - alert, oriented to person, place, and time  Psych:  She has a normal mood and affect  Skin - warm and dry, normal color, no suspicious lesions noted  Chest - effort normal, all lung fields clear to auscultation bilaterally  Heart - normal rate and regular rhythm  Neck:  midline trachea, no thyromegaly or nodules  Breasts - breasts appear normal, no suspicious masses, no skin or nipple changes or  axillary nodes  Abdomen - soft, nontender, nondistended, no masses or organomegaly  Pelvic - VULVA: normal appearing vulva with no masses, tenderness or lesions  VAGINA: normal appearing vagina with normal color and discharge, no lesions  CERVIX: normal appearing cervix without discharge or lesions, no CMT  Thin prep pap is {Desc; done/not:10129} *** HR HPV cotesting  UTERUS: uterus is felt to be normal size, shape, consistency and nontender   ADNEXA: No adnexal masses or tenderness noted.  Rectal - normal rectal, good sphincter tone, no masses felt. Hemoccult: ***  Extremities:  No swelling or varicosities noted  Chaperone present for exam  No results found for this or any previous visit (from the past 24 hours).  Assessment & Plan:  1) Well-Woman Exam  2) ***  Labs/procedures today: ***  Mammogram: {Mammo f/u:25212::@ 50yo}, or sooner if problems Colonoscopy: {TCS f/u:25213::@ 50yo}, or sooner if problems  No orders of the defined types were placed in this encounter.   Meds: No orders of the defined types were placed in this encounter.   Follow-up: No follow-ups on file.  Sprague, Kaitlyn E, RN 04/24/2024 11:54 PM

## 2024-04-25 ENCOUNTER — Encounter (HOSPITAL_BASED_OUTPATIENT_CLINIC_OR_DEPARTMENT_OTHER): Payer: Self-pay | Admitting: Obstetrics & Gynecology

## 2024-04-25 ENCOUNTER — Ambulatory Visit (HOSPITAL_BASED_OUTPATIENT_CLINIC_OR_DEPARTMENT_OTHER): Payer: BC Managed Care – PPO | Admitting: Obstetrics & Gynecology

## 2024-04-25 VITALS — BP 147/89 | HR 75 | Ht 62.0 in | Wt 171.2 lb

## 2024-04-25 DIAGNOSIS — Z01419 Encounter for gynecological examination (general) (routine) without abnormal findings: Secondary | ICD-10-CM

## 2024-04-25 DIAGNOSIS — Z87410 Personal history of cervical dysplasia: Secondary | ICD-10-CM | POA: Diagnosis not present

## 2024-04-25 DIAGNOSIS — R030 Elevated blood-pressure reading, without diagnosis of hypertension: Secondary | ICD-10-CM | POA: Diagnosis not present

## 2024-04-25 DIAGNOSIS — Z90710 Acquired absence of both cervix and uterus: Secondary | ICD-10-CM

## 2024-05-13 ENCOUNTER — Encounter (HOSPITAL_BASED_OUTPATIENT_CLINIC_OR_DEPARTMENT_OTHER): Payer: Self-pay | Admitting: Obstetrics & Gynecology

## 2024-05-14 ENCOUNTER — Ambulatory Visit: Admitting: Dermatology

## 2024-06-28 ENCOUNTER — Other Ambulatory Visit (HOSPITAL_BASED_OUTPATIENT_CLINIC_OR_DEPARTMENT_OTHER): Payer: Self-pay

## 2024-06-28 MED ORDER — FLUZONE 0.5 ML IM SUSY
0.5000 mL | PREFILLED_SYRINGE | Freq: Once | INTRAMUSCULAR | 0 refills | Status: AC
  Filled 2024-06-28: qty 0.5, 1d supply, fill #0

## 2024-09-06 ENCOUNTER — Ambulatory Visit: Admitting: Family Medicine

## 2024-09-06 ENCOUNTER — Encounter: Payer: Self-pay | Admitting: Family Medicine

## 2024-09-06 VITALS — BP 128/84 | HR 90 | Temp 99.1°F | Ht 62.0 in | Wt 176.8 lb

## 2024-09-06 DIAGNOSIS — G43109 Migraine with aura, not intractable, without status migrainosus: Secondary | ICD-10-CM

## 2024-09-06 DIAGNOSIS — H409 Unspecified glaucoma: Secondary | ICD-10-CM | POA: Diagnosis not present

## 2024-09-06 DIAGNOSIS — R635 Abnormal weight gain: Secondary | ICD-10-CM | POA: Diagnosis not present

## 2024-09-06 DIAGNOSIS — R61 Generalized hyperhidrosis: Secondary | ICD-10-CM | POA: Diagnosis not present

## 2024-09-06 DIAGNOSIS — G4700 Insomnia, unspecified: Secondary | ICD-10-CM | POA: Diagnosis not present

## 2024-09-06 DIAGNOSIS — Z90710 Acquired absence of both cervix and uterus: Secondary | ICD-10-CM

## 2024-09-06 NOTE — Progress Notes (Signed)
 "  Established Patient Office Visit   Subjective  Patient ID: Alexandra Welch, female    DOB: 1976-07-01  Age: 49 y.o. MRN: [id number]  Chief Complaint  Patient presents with   Acute Visit    Patient came in today for night sweats, dizziness when waking up, increased headaches and ocular migraines, Anxiety at night, started 2 month ago     Pt is a 49 yo female seen for ongoing concerns including significant night sweats and sleep disturbances over the past few months. She wakes up sweating and feeling extremely hot, followed by feeling cold, without having a fever. These symptoms primarily occur at night, leading to poor sleep quality, where she often lies awake for hours. She feels nervous at night, unsure if it's due to lack of sleep or anxiety.    History of ocular migraines, which have increased in frequency recently. Episode will start with  seeing zigzag lines, lasting about 30 minutes, sometimes followed by a headache. Will take Tylenol if needed for the HA.  Recently dx/'d with glaucoma.    Weight gain of six to seven pounds in less than two months despite maintaining a healthy diet. She follows a low carbohydrate diet with fruits and vegetables and finds it difficult to lose the gained weight.  Endorses mood swings, occasional crying spells, and joint pain, particularly in her knees after sitting for long periods. She also reports feeling dizzy upon waking, which subsides within a few minutes. She drinks three to four glasses of water daily and has reduced caffeine intake due to tachycardia, primarily drinking decaf coffee and occasionally consuming Diet Doctor Pepper.  She has a history of anxiety and has been on and off antidepressants throughout her life. She tries to manage without them due to side effects.  Followed by OB/Gyn.    Patient Active Problem List   Diagnosis Date Noted   History of cervical dysplasia 01/05/2022   White coat syndrome without diagnosis of  hypertension 01/05/2022   History of LAVH 01/05/2022   Mild intermittent asthma 08/27/2014   Fibrocystic breast changes 08/27/2014   Anxiety state 11/29/2007   Past Medical History:  Diagnosis Date   Anxiety    Asthma    Basal cell carcinoma of skin    Dr. Arlen   Fibrocystic breast changes 08/27/2014   No hx breast biopsy. No FH first degree relative with breast or ovarian ca. Getting yearly mammograms per prior PCP recs.    GERD (gastroesophageal reflux disease)    PVC (premature ventricular contraction)    negative cardiac work up   Past Surgical History:  Procedure Laterality Date   LAPAROSCOPIC HYSTERECTOMY  2007   for cervical dysplasia   Social History[1] Family History  Problem Relation Age of Onset   Asthma Mother    Anxiety disorder Mother    Post-traumatic stress disorder Mother    Lung cancer Father        smoker and worked in haematologist mine   Anxiety disorder Sister    Anxiety disorder Sister    Suicidality Brother        gambling died by suicide   Esophageal cancer Paternal Uncle    Obsessive Compulsive Disorder Son    Colon cancer Neg Hx    Colon polyps Neg Hx    Stomach cancer Neg Hx    Rectal cancer Neg Hx    Allergies[2]  ROS Negative unless stated above    Objective:     BP 128/84 (BP Location:  Left Arm, Patient Position: Sitting, Cuff Size: Large)   Pulse 90   Temp 99.1 F (37.3 C) (Oral)   Ht 5' 2 (1.575 m)   Wt 176 lb 12.8 oz (80.2 kg)   LMP 08/29/2005   SpO2 98%   BMI 32.34 kg/m  BP Readings from Last 3 Encounters:  09/06/24 128/84  04/25/24 (!) 147/89  11/14/23 (!) 134/90   Wt Readings from Last 3 Encounters:  09/06/24 176 lb 12.8 oz (80.2 kg)  04/25/24 171 lb 3.2 oz (77.7 kg)  11/14/23 167 lb 12.8 oz (76.1 kg)      Physical Exam Constitutional:      General: She is not in acute distress.    Appearance: Normal appearance.  HENT:     Head: Normocephalic and atraumatic.     Nose: Nose normal.     Mouth/Throat:      Mouth: Mucous membranes are moist.  Cardiovascular:     Rate and Rhythm: Normal rate and regular rhythm.     Heart sounds: Normal heart sounds. No murmur heard.    No gallop.  Pulmonary:     Effort: Pulmonary effort is normal. No respiratory distress.     Breath sounds: Normal breath sounds. No wheezing, rhonchi or rales.  Skin:    General: Skin is warm and dry.  Neurological:     Mental Status: She is alert and oriented to person, place, and time.        09/06/2024    4:05 PM 04/25/2024    8:58 AM 04/25/2023    2:11 PM  Depression screen PHQ 2/9  Decreased Interest 0 0 0  Down, Depressed, Hopeless 0 0 0  PHQ - 2 Score 0 0 0  Altered sleeping 3    Tired, decreased energy 2    Change in appetite 0    Feeling bad or failure about yourself  0    Trouble concentrating 0    Moving slowly or fidgety/restless 0    Suicidal thoughts 0    PHQ-9 Score 5        09/06/2024    4:05 PM 03/29/2023    4:37 PM  GAD 7 : Generalized Anxiety Score  Nervous, Anxious, on Edge 2 0  Control/stop worrying 2 0  Worry too much - different things 2 0  Trouble relaxing 2 0  Restless 1 0  Easily annoyed or irritable 2 0  Afraid - awful might happen 2 0  Total GAD 7 Score 13 0     No results found for any visits on 09/06/24.    Assessment & Plan:   Night sweats -     Estrogens , total; Future -     Follicle stimulating hormone; Future -     Luteinizing hormone; Future -     T4, free; Future -     TSH; Future  Insomnia, unspecified type -     T4, free; Future -     TSH; Future  Ocular migraine  Glaucoma, unspecified glaucoma type, unspecified laterality  S/P hysterectomy -     Prolactin; Future -     Estrogens , total; Future -     Follicle stimulating hormone; Future -     Luteinizing hormone; Future -     T4, free; Future -     TSH; Future  Weight gain   S/p hysterectomy 20 yrs ago.  Current perimenopausal/menopausal symptoms including wt gain, insomnia night sweats.  Obtain  labs to confirm.  Check to see  if OTC supplements such as Black Cohosh or Estrovenok with glaucoma.  Advised to reduce caffeine intake and spicy foods.  F/u with OB/Gyn  Increased ocular migraines.  Possibly related to hormonal changes, increased anxiety, or decreased sleep.  Continue Tylenol as needed.  Self care and decreasing anxiety.  Continue f/u with Ophtho.  Return if symptoms worsen or fail to improve, for physical.   Clotilda JONELLE Single, MD     [1]  Social History Tobacco Use   Smoking status: Never   Smokeless tobacco: Never  Vaping Use   Vaping status: Never Used  Substance Use Topics   Alcohol use: Not Currently    Alcohol/week: 1.0 standard drink of alcohol    Types: 1 Glasses of wine per week    Comment: occ   Drug use: No  [2]  Allergies Allergen Reactions   Sulfa  Antibiotics Other (See Comments)    Throat itchiness    Doxycycline  Swelling and Rash    Mild upper lip swelling, facial rash.    Shellfish Allergy Swelling   Nsaids Other (See Comments)    All have made her tongue itch   "

## 2024-09-11 LAB — LUTEINIZING HORMONE: LH: 4 m[IU]/mL

## 2024-09-11 LAB — PROLACTIN: Prolactin: 7.5 ng/mL

## 2024-09-11 LAB — FOLLICLE STIMULATING HORMONE: FSH: 5.2 m[IU]/mL

## 2024-09-11 LAB — T4, FREE: Free T4: 1.1 ng/dL (ref 0.8–1.8)

## 2024-09-11 LAB — TSH: TSH: 1.03 m[IU]/L

## 2024-09-11 LAB — ESTROGENS, TOTAL: Estrogen: 176 pg/mL

## 2024-09-13 ENCOUNTER — Ambulatory Visit: Payer: Self-pay | Admitting: Family Medicine

## 2024-09-23 ENCOUNTER — Encounter: Admitting: Family Medicine

## 2024-10-02 ENCOUNTER — Encounter: Admitting: Family Medicine

## 2024-10-17 ENCOUNTER — Encounter: Admitting: Family Medicine
